# Patient Record
Sex: Male | Born: 1945 | Race: White | Hispanic: No | Marital: Married | Smoking: Former smoker
Health system: Southern US, Community
[De-identification: ages and names within clinical notes are randomized; demographics above are authoritative.]

## PROBLEM LIST (undated history)

## (undated) DIAGNOSIS — I4891 Unspecified atrial fibrillation: Principal | ICD-10-CM

## (undated) DIAGNOSIS — I2510 Atherosclerotic heart disease of native coronary artery without angina pectoris: Secondary | ICD-10-CM

## (undated) DIAGNOSIS — E785 Hyperlipidemia, unspecified: Secondary | ICD-10-CM

## (undated) DIAGNOSIS — I480 Paroxysmal atrial fibrillation: Secondary | ICD-10-CM

## (undated) DIAGNOSIS — I10 Essential (primary) hypertension: Secondary | ICD-10-CM

## (undated) DIAGNOSIS — I495 Sick sinus syndrome: Secondary | ICD-10-CM

## (undated) HISTORY — DX: Paroxysmal atrial fibrillation: I48.0

## (undated) HISTORY — DX: Sick sinus syndrome: I49.5

## (undated) HISTORY — PX: CARDIAC ELECTROPHYSIOLOGY STUDY AND ABLATION: SHX1294

---

## 1997-10-17 ENCOUNTER — Inpatient Hospital Stay (HOSPITAL_COMMUNITY): Admission: EM | Admit: 1997-10-17 | Discharge: 1997-10-19 | Payer: Self-pay | Admitting: Emergency Medicine

## 1997-10-26 ENCOUNTER — Inpatient Hospital Stay (HOSPITAL_COMMUNITY): Admission: EM | Admit: 1997-10-26 | Discharge: 1997-11-01 | Payer: Self-pay | Admitting: Emergency Medicine

## 1997-10-29 HISTORY — PX: CORONARY ANGIOPLASTY WITH STENT PLACEMENT: SHX49

## 1997-12-03 ENCOUNTER — Ambulatory Visit (HOSPITAL_COMMUNITY): Admission: RE | Admit: 1997-12-03 | Discharge: 1997-12-03 | Payer: Self-pay | Admitting: Endocrinology

## 2003-06-25 ENCOUNTER — Emergency Department (HOSPITAL_COMMUNITY): Admission: AD | Admit: 2003-06-25 | Discharge: 2003-06-25 | Payer: Self-pay | Admitting: Family Medicine

## 2003-06-26 ENCOUNTER — Emergency Department (HOSPITAL_COMMUNITY): Admission: EM | Admit: 2003-06-26 | Discharge: 2003-06-26 | Payer: Self-pay | Admitting: Family Medicine

## 2003-07-06 ENCOUNTER — Emergency Department (HOSPITAL_COMMUNITY): Admission: EM | Admit: 2003-07-06 | Discharge: 2003-07-06 | Payer: Self-pay | Admitting: Family Medicine

## 2005-07-15 ENCOUNTER — Encounter: Payer: Self-pay | Admitting: Endocrinology

## 2006-09-08 ENCOUNTER — Ambulatory Visit: Payer: Self-pay | Admitting: Internal Medicine

## 2010-07-29 NOTE — Letter (Signed)
September 08, 2006    Richard A. Alanda Amass, M.D.  [id number]  [place], [place] [address]   RE:  Timothy [name], Timothy [name]  MRN:  [id number]  /  DOB:  Mar 23, 1945   Dear Luan Pulling:   I hope this letter finds you well.  I hope your family is well also.   Dayton Bailiff, as you know, works for [employer] and we recently  had a project done with them.  At that time, Beacher May told me about  Timothy [name], that he was having problems with tachypalpitations, and I  said that if there was ever a question I would be glad to see him.  About 3 or 4 weeks ago Timothy [name] called again and asked if I would  see him, so I saw Timothy [name] today at his request.   As you know, he has had atrial fibrillation for 10-15 years and has been  on amiodarone for about 10 years.  This has been moderately effective  over the years but over the last couple of years he has had increasing  frequency of episodes, increasing symptoms associated with the frequency  and the duration of these episodes.  Over the last 3-4 weeks his  symptoms have been increasingly problematic.  He becomes unable to work  with these, he gets fatigued and has significant dyspnea on exertion.   Further issues related to his amiodarone include hyperthyroidism for  which he is treated with potassium perchlorate.   He has a history of coronary artery disease.  He is status post stenting  about 7 years ago.  He had an echocardiogram done 4 or 5 years ago which  we were able to obtain from your office that demonstrated normal left  ventricular function.   His thromboembolic risk factors are notable for hypertension for which  he takes amlodipine (see below).  They are otherwise negative for  diabetes, prior stroke, or congestive heart failure.   His past medical history, apart from the above, is largely negative.  His review of systems is extensively negative across multiple organ  systems.   His past surgical history is negative.   Social history, he is married.  He has three children, nine  grandchildren.  He does not use cigarettes or recreational drugs.  He  does drink occasional red wine.   His medications include amiodarone 200 a day, amlodipine 5, potassium  perchlorate, warfarin 5, Lipitor 20, and aspirin 81.  He has no known  drug allergies.   On examination, he is a middle-aged Caucasian male appearing his stated  age of 84.  His blood pressure is 132/64 and his pulse is 50.  His HEENT  exam demonstrated no icterus or xanthomata.  The neck veins were flat.  The carotids were brisk and full bilaterally without bruits.  The back  was without kyphosis or scoliosis.  Lungs were clear, heart sounds were  regular without murmurs or gallops.  The abdomen was soft with active  bowel sounds without midline pulsation or hepatomegaly.  Femoral pulses  were 2+, distal pulses were intact.  There was no clubbing, cyanosis, or  edema.  The neurological exam was grossly normal.  His skin was warm and  dry.   Electrocardiogram dated today demonstrated sinus rhythm at 57 with  intervals of 0.16/0.11/0.46.  The axis was rightward at 108.  The  electrocardiogram was otherwise normal.   IMPRESSION:  1. Paroxysmal atrial fibrillation.  2. Amiodarone therapy for #1.  3. Hyperthyroidism complicating #2.  4. Bradycardia complicating #2.  5. Hypertension, on amlodipine.   Rich, I think that Timothy [name] would be a good candidate for  consideration of pulmonary vein isolation.  Apparently you had mentioned  this to him as well and had specifically mentioned that somebody at  [facility] would be a good resource and I concur and gave him Colin Broach name.   We went over some of the data.  I think notwithstanding the moderate  success associated with the procedure, I think given the longterm  complications of his amiodarone and the Coumadin therapy that I would  pursue it.   The only other question I had is whether he  might be better served with  an ARB for his hypertension as opposed to amlodipine.  As you know,  there are data that suggests that frequency of atrial fibrillation may  be reduced by use of an ARB potentially related to more beneficial  remodeling of the atrium.   If I can be of any further assistance in his care please do not hesitate  to contact me.    Sincerely,      Duke Salvia, MD, [facility]  Electronically Signed    SCK/MedQ  DD: 09/08/2006  DT: 09/08/2006  Job #: [id number]   CC:   Lucila Maine, M.D.  Clydie Braun, M.D.

## 2011-06-13 ENCOUNTER — Other Ambulatory Visit: Payer: Self-pay

## 2011-06-13 ENCOUNTER — Emergency Department (HOSPITAL_COMMUNITY): Payer: Commercial Managed Care - PPO

## 2011-06-13 ENCOUNTER — Inpatient Hospital Stay (HOSPITAL_COMMUNITY)
Admission: EM | Admit: 2011-06-13 | Discharge: 2011-06-15 | DRG: 310 | Disposition: A | Discharge status: Home / Self Care | Payer: Commercial Managed Care - PPO | Attending: Internal Medicine | Admitting: Internal Medicine

## 2011-06-13 ENCOUNTER — Encounter (HOSPITAL_COMMUNITY): Payer: Self-pay | Admitting: *Deleted

## 2011-06-13 DIAGNOSIS — I480 Paroxysmal atrial fibrillation: Secondary | ICD-10-CM | POA: Diagnosis present

## 2011-06-13 DIAGNOSIS — I4891 Unspecified atrial fibrillation: Principal | ICD-10-CM | POA: Diagnosis present

## 2011-06-13 DIAGNOSIS — E876 Hypokalemia: Secondary | ICD-10-CM | POA: Diagnosis present

## 2011-06-13 DIAGNOSIS — Z7982 Long term (current) use of aspirin: Secondary | ICD-10-CM

## 2011-06-13 DIAGNOSIS — Z9861 Coronary angioplasty status: Secondary | ICD-10-CM

## 2011-06-13 DIAGNOSIS — Z79899 Other long term (current) drug therapy: Secondary | ICD-10-CM

## 2011-06-13 DIAGNOSIS — I10 Essential (primary) hypertension: Secondary | ICD-10-CM | POA: Diagnosis present

## 2011-06-13 DIAGNOSIS — R079 Chest pain, unspecified: Secondary | ICD-10-CM | POA: Diagnosis present

## 2011-06-13 DIAGNOSIS — E785 Hyperlipidemia, unspecified: Secondary | ICD-10-CM | POA: Diagnosis present

## 2011-06-13 DIAGNOSIS — I2510 Atherosclerotic heart disease of native coronary artery without angina pectoris: Secondary | ICD-10-CM | POA: Diagnosis present

## 2011-06-13 DIAGNOSIS — R002 Palpitations: Secondary | ICD-10-CM | POA: Diagnosis present

## 2011-06-13 HISTORY — DX: Unspecified atrial fibrillation: I48.91

## 2011-06-13 HISTORY — PX: TRANSTHORACIC ECHOCARDIOGRAM: SHX275

## 2011-06-13 HISTORY — DX: Hyperlipidemia, unspecified: E78.5

## 2011-06-13 HISTORY — DX: Atherosclerotic heart disease of native coronary artery without angina pectoris: I25.10

## 2011-06-13 HISTORY — DX: Essential (primary) hypertension: I10

## 2011-06-13 LAB — BASIC METABOLIC PANEL
CO2: 20 mEq/L (ref 19–32)
CO2: 23 mEq/L (ref 19–32)
Calcium: 9.4 mg/dL (ref 8.4–10.5)
Calcium: 8.5 mg/dL (ref 8.4–10.5)
GFR calc Af Amer: >90 mL/min (ref >90)
GFR calc non Af Amer: 90 mL/min — ABNORMAL LOW (ref >90)
Glucose, Bld: 117 mg/dL — ABNORMAL HIGH (ref 70–99)
Potassium: 3.2 mEq/L — ABNORMAL LOW (ref 3.5–5.1)
Sodium: 140 mEq/L (ref 135–145)
Sodium: 142 mEq/L (ref 135–145)

## 2011-06-13 LAB — CBC
Hemoglobin: 15.7 g/dL (ref 13.0–17.0)
Platelets: 237 10*3/uL (ref 150–400)
Platelets: 243 10*3/uL (ref 150–400)
RBC: 5 MIL/uL (ref 4.22–5.81)
RBC: 4.52 MIL/uL (ref 4.22–5.81)
RDW: 13.4 % (ref 11.5–15.5)
WBC: 7.7 10*3/uL (ref 4.0–10.5)
WBC: 8 10*3/uL (ref 4.0–10.5)

## 2011-06-13 LAB — LIPID PANEL
LDL Cholesterol: 67 mg/dL (ref 0–99)
Total CHOL/HDL Ratio: 3.2 RATIO
VLDL: 30 mg/dL (ref 0–40)

## 2011-06-13 LAB — APTT: aPTT: 27 seconds (ref 24–37)

## 2011-06-13 LAB — PROTIME-INR
INR: 0.92 (ref 0.00–1.49)
Prothrombin Time: 12.6 seconds (ref 11.6–15.2)

## 2011-06-13 LAB — TROPONIN I: Troponin I: <0.3 ng/mL (ref <0.30)

## 2011-06-13 LAB — CARDIAC PANEL(CRET KIN+CKTOT+MB+TROPI)
CK, MB: 2.9 ng/mL (ref 0.3–4.0)
CK, MB: 2.2 ng/mL (ref 0.3–4.0)
Relative Index: 2.1 (ref 0.0–2.5)
Troponin I: <0.3 ng/mL (ref <0.30)
Troponin I: <0.3 ng/mL (ref <0.30)

## 2011-06-13 LAB — MRSA PCR SCREENING: MRSA by PCR: NEGATIVE

## 2011-06-13 MED ORDER — DILTIAZEM HCL 25 MG/5ML IV SOLN
INTRAVENOUS | Status: AC
  Administered 2011-06-13: 10 mg
  Filled 2011-06-13: qty 5

## 2011-06-13 MED ORDER — SODIUM CHLORIDE 0.9 % IV BOLUS (SEPSIS)
500.0000 mL | Freq: Once | INTRAVENOUS | Status: AC
  Administered 2011-06-13: 500 mL via INTRAVENOUS

## 2011-06-13 MED ORDER — ASPIRIN 81 MG PO CHEW
CHEWABLE_TABLET | ORAL | Status: AC
  Filled 2011-06-13: qty 4

## 2011-06-13 MED ORDER — DEXTROSE 5 % IV SOLN
5.0000 mg/h | INTRAVENOUS | Status: DC
  Administered 2011-06-13: 20 mg/h via INTRAVENOUS

## 2011-06-13 MED ORDER — ONDANSETRON HCL 4 MG PO TABS
4.0000 mg | ORAL_TABLET | Freq: Four times a day (QID) | ORAL | Status: DC | PRN
  Filled 2011-06-13: qty 0.5

## 2011-06-13 MED ORDER — POTASSIUM CHLORIDE 10 MEQ/100ML IV SOLN
10.0000 meq | INTRAVENOUS | Status: AC
  Administered 2011-06-13 (×2): 10 meq via INTRAVENOUS
  Filled 2011-06-13: qty 100

## 2011-06-13 MED ORDER — ACETAMINOPHEN 650 MG RE SUPP: 650.0000 mg | Freq: Four times a day (QID) | RECTAL | Status: DC | PRN

## 2011-06-13 MED ORDER — ZOLPIDEM TARTRATE 5 MG PO TABS: 5.0000 mg | ORAL_TABLET | Freq: Every evening | ORAL | Status: DC | PRN

## 2011-06-13 MED ORDER — DRONEDARONE HCL 400 MG PO TABS
400.0000 mg | ORAL_TABLET | Freq: Two times a day (BID) | ORAL | Status: DC
  Administered 2011-06-13 – 2011-06-15 (×5): 400 mg via ORAL
  Filled 2011-06-13 (×7): qty 1

## 2011-06-13 MED ORDER — HEPARIN BOLUS VIA INFUSION
4000.0000 [IU] | Freq: Once | INTRAVENOUS | Status: AC
  Administered 2011-06-13: 4000 [IU] via INTRAVENOUS
  Filled 2011-06-13: qty 4000

## 2011-06-13 MED ORDER — ACETAMINOPHEN 325 MG PO TABS: 650.0000 mg | ORAL_TABLET | Freq: Four times a day (QID) | ORAL | Status: DC | PRN

## 2011-06-13 MED ORDER — HEPARIN (PORCINE) IN NACL 100-0.45 UNIT/ML-% IJ SOLN
1150.0000 [IU]/h | INTRAMUSCULAR | Status: DC
  Administered 2011-06-13: 1300 [IU]/h via INTRAVENOUS
  Administered 2011-06-13 – 2011-06-15 (×2): 1150 [IU]/h via INTRAVENOUS
  Filled 2011-06-13 (×5): qty 250

## 2011-06-13 MED ORDER — SODIUM CHLORIDE 0.9 % IV SOLN
INTRAVENOUS | Status: DC
  Administered 2011-06-13 (×2): via INTRAVENOUS
  Administered 2011-06-14: 75 mL via INTRAVENOUS
  Administered 2011-06-15: 08:00:00 via INTRAVENOUS

## 2011-06-13 MED ORDER — SODIUM CHLORIDE 0.9 % IJ SOLN
3.0000 mL | Freq: Two times a day (BID) | INTRAMUSCULAR | Status: DC
  Administered 2011-06-14 – 2011-06-15 (×3): 3 mL via INTRAVENOUS

## 2011-06-13 MED ORDER — OXYCODONE HCL 5 MG PO TABS: 5.0000 mg | ORAL_TABLET | ORAL | Status: DC | PRN

## 2011-06-13 MED ORDER — DILTIAZEM HCL 100 MG IV SOLR
20.0000 mg/h | INTRAVENOUS | Status: DC
  Administered 2011-06-13 (×2): 20 mg/h via INTRAVENOUS
  Administered 2011-06-14: 10 mg/h via INTRAVENOUS
  Filled 2011-06-13 (×3): qty 100

## 2011-06-13 MED ORDER — DILTIAZEM HCL 100 MG IV SOLR
5.0000 mg/h | Freq: Once | INTRAVENOUS | Status: AC
  Administered 2011-06-13: 5 mg/h via INTRAVENOUS

## 2011-06-13 MED ORDER — METOPROLOL TARTRATE 25 MG PO TABS
25.0000 mg | ORAL_TABLET | Freq: Two times a day (BID) | ORAL | Status: DC
  Administered 2011-06-13 – 2011-06-15 (×5): 25 mg via ORAL
  Filled 2011-06-13 (×6): qty 1

## 2011-06-13 MED ORDER — ASPIRIN 325 MG PO TABS: 325.0000 mg | ORAL_TABLET | ORAL | Status: DC

## 2011-06-13 MED ORDER — ONDANSETRON HCL 4 MG/2ML IJ SOLN: 4.0000 mg | Freq: Four times a day (QID) | INTRAMUSCULAR | Status: DC | PRN

## 2011-06-13 MED ORDER — ASPIRIN EC 325 MG PO TBEC
325.0000 mg | DELAYED_RELEASE_TABLET | Freq: Every day | ORAL | Status: DC
  Administered 2011-06-13 – 2011-06-15 (×3): 325 mg via ORAL
  Filled 2011-06-13 (×3): qty 1

## 2011-06-13 MED ORDER — POTASSIUM CHLORIDE 10 MEQ/100ML IV SOLN
INTRAVENOUS | Status: AC
  Filled 2011-06-13: qty 100

## 2011-06-13 MED ORDER — SODIUM CHLORIDE 0.9 % IV SOLN: INTRAVENOUS | Status: AC

## 2011-06-13 MED ORDER — ALUM & MAG HYDROXIDE-SIMETH 200-200-20 MG/5ML PO SUSP: 30.0000 mL | Freq: Four times a day (QID) | ORAL | Status: DC | PRN

## 2011-06-13 MED ORDER — HYDROMORPHONE HCL PF 1 MG/ML IJ SOLN: 0.5000 mg | INTRAMUSCULAR | Status: DC | PRN

## 2011-06-13 NOTE — Plan of Care (Signed)
Discussed with Dr. Rennis Golden with [facility] cardiology regarding the patient, Mr Mabile admitted this morning with Afib with RVR. Dr. Rennis Golden will be assuming care as primary attending, [facility] will sign off.    Ivannah Zody M.D. [facility] 06/13/2011, 8:57 AM  Pager: [phone]

## 2011-06-13 NOTE — Progress Notes (Signed)
ANTICOAGULATION CONSULT NOTE - Follow Up Consult  Pharmacy Consult for Heparin Indication: atrial fibrillation  Allergies  Allergen Reactions  . Nitroglycerin Other (See Comments)    "Blood Pressure goes to zero"    Patient Measurements: Height: 5\' 6"  (167.6 cm) Weight: 188 lb 0.8 oz (85.3 kg) IBW/kg (Calculated) : 63.8  Heparin Dosing Weight: 85.3 kg  Vital Signs: Temp: 98.1 F (36.7 C) (03/30 2000) Temp src: Oral (03/30 2000) BP: 122/86 mmHg (03/30 2135) Pulse Rate: 99  (03/30 2135)  Labs:  Alvira Philips 06/13/11 2114 06/13/11 2113 06/13/11 1232 06/13/11 0530 06/13/11 0500 06/13/11 0459 06/13/11 0103  HGB -- -- -- -- 14.1 -- 15.7  HCT -- -- -- -- 41.4 -- 45.1  PLT -- -- -- -- 243 -- 237  APTT -- -- -- 27 -- -- --  LABPROT -- -- -- 12.6 -- -- --  INR -- -- -- 0.92 -- -- --  HEPARINUNFRC 0.46 -- 0.81* -- -- -- --  CREATININE -- -- -- -- 0.87 -- 0.79  CKTOTAL -- 108 135 -- -- 163 --  CKMB -- 2.2 2.9 -- -- 3.1 --  TROPONINI -- <0.30 <0.30 -- -- <0.30 --   Estimated Creatinine Clearance: 89 ml/min (by C-G formula based on Cr of 0.87).   Medications:  Scheduled:     . sodium chloride   Intravenous STAT  . aspirin EC  325 mg Oral Daily  . diltiazem (CARDIZEM) infusion  5-15 mg/hr Intravenous Once  . diltiazem      . dronedarone  400 mg Oral BID WC  . heparin  4,000 Units Intravenous Once  . metoprolol tartrate  25 mg Oral BID  . potassium chloride  10 mEq Intravenous Q1 Hr x 2  . potassium chloride      . sodium chloride  500 mL Intravenous Once  . sodium chloride  500 mL Intravenous Once  . sodium chloride  3 mL Intravenous Q12H  . DISCONTD: aspirin  325 mg Oral STAT   Infusions:     . sodium chloride 75 mL/hr at 06/13/11 1728  . diltiazem (CARDIZEM) infusion 20 mg/hr (06/13/11 1045)  . heparin 1,150 Units/hr (06/13/11 1445)  . DISCONTD: diltiazem (CARDIZEM) infusion 20 mg/hr (06/13/11 0407)    Assessment: 66 yo M admitted w/ afib. S/p 4000 unit bolus and  rate at 1300 units/hr. 6 hr heparin level above goal at 0.81.  Heparin rate was decreased to 1150 uts/hr.  HL recheck 0.44 at goal 0.3-0.7. No bleeding issues noted  Goal of Therapy:  Heparin level 0.3-0.7 units/ml   Plan:  Continue Heparin drip 1150 uts/hr Daily HL and CBC  Marcelino Scot, PharmD  [phone] 06/13/2011,10:13 PM

## 2011-06-13 NOTE — ED Notes (Signed)
Pt woke up at 2330 with palpitations.  This is associated with sob and CP and weakness.  Pt with hx afib.

## 2011-06-13 NOTE — Progress Notes (Signed)
ANTICOAGULATION CONSULT NOTE - Initial Consult  Pharmacy Consult for Heparin Indication: atrial fibrillation  Allergies  Allergen Reactions  . Nitroglycerin Other (See Comments)    "Blood Pressure goes to zero"    Patient Measurements: Height: 5\' 6"  (167.6 cm) Weight: 188 lb 0.8 oz (85.3 kg) IBW/kg (Calculated) : 63.8  Heparin Dosing Weight: 85.3 kg  Vital Signs: Temp: 97.7 F (36.5 C) (03/30 0300) Temp src: Oral (03/30 0300) BP: 121/81 mmHg (03/30 0430) Pulse Rate: 102  (03/30 0430)  Labs:  Basename 06/13/11 0103  HGB 15.7  HCT 45.1  PLT 237  APTT --  LABPROT --  INR --  HEPARINUNFRC --  CREATININE 0.79  CKTOTAL --  CKMB --  TROPONINI <0.30   Estimated Creatinine Clearance: 96.8 ml/min (by C-G formula based on Cr of 0.79).  Medical History: Past Medical History  Diagnosis Date  . Atrial fibrillation   . Hypertension   . CAD (coronary artery disease)   . Hyperlipidemia    Assessment:  Being admitted with atrial fibrillation.  No baseline coags, will add.  Cardiology to see.  Goal of Therapy:  Heparin level 0.3-0.7 units/ml   Plan:    Will begin heparin with 4000 unit IV bolus, then infusion at 1300 units/hour.  First heparin level ~ 6 hrs after infusion begins.  Daily heparin level and CBC while on heparin.  Dennie Fetters, RPh  Pager: [phone] 06/13/2011,4:48 AM

## 2011-06-13 NOTE — ED Notes (Signed)
Pt states he was woke from sleep at 11:30 tonight c/o CP and palpitations.  Pt with hx of Afib, had ablation in 2008 and 2009.  States he has not had an episode since 2009.  States he always has pain when in Afib.  Also c/o SOB.  No n/v, no diaphoresis, denies abdominal pain.

## 2011-06-13 NOTE — H&P (Signed)
DATE OF ADMISSION:  06/13/2011  PCP:  Timothy [name] in [place] CARDS:  [facility] ( Dr. Alanda [name])  Chief Complaint: Palpitations and Chest Pain   HPI: Timothy [name] is an 66 y.o. male with a history of Paroxysmal Atrial fibrillation S/P cardioversion and ablations who presents with complaints of palpitations, chest pain and SOB since 11:30pm tonight.  He describes having chest discomfort in his mid chest without radiation.  In the ED his heart rate was 120-130s, and a found to be in Atrial fibrillation with RVR.  A cardiazem drip was started, and his rate improved.   He was referred for admission.  He reports that he has not had problems since his ablation in 2006.    Past Medical History  Diagnosis Date  . Atrial fibrillation   . Hypertension   . CAD (coronary artery disease)   . Hyperlipidemia     Past Surgical History  Procedure Date  . Cardiac electrophysiology study and ablation     Ablation  X  4  . Coronary angioplasty with stent placement     Stent  X  1    Medications:  HOME MEDS: Prior to Admission medications   Medication Sig Start Date End Date Taking? Authorizing Provider  aspirin 81 MG chewable tablet Chew 81 mg by mouth daily.   Yes Historical Provider, MD  metoprolol tartrate (LOPRESSOR) 25 MG tablet Take 25 mg by mouth daily.   Yes Historical Provider, MD  simvastatin (ZOCOR) 40 MG tablet Take 40 mg by mouth at bedtime.   Yes Historical Provider, MD    Allergies:  Allergies  Allergen Reactions  . Nitroglycerin Other (See Comments)    "Blood Pressure goes to zero"    Social History:   does not have a smoking history on file. He does not have any smokeless tobacco history on file. He reports that he drinks alcohol. His drug history not on file.  Family History: Family History  Problem Relation Age of Onset  . Coronary artery disease Timothy [name]   . Hypertension Timothy [name]   . Diabetes Timothy [name]     Review of Systems:  The patient denies anorexia, fever, weight  loss,, vision loss, decreased hearing, hoarseness, chest pain, syncope, dyspnea on exertion, peripheral edema, balance deficits, hemoptysis, abdominal pain, melena, hematochezia, severe indigestion/heartburn, hematuria, incontinence, genital sores, muscle weakness, suspicious skin lesions, transient blindness, difficulty walking, depression, unusual weight change, abnormal bleeding, enlarged lymph nodes, angioedema, and breast masses.   Physical Exam:  GEN:  Pleasant Obese 66 year old Caucasian male examined  and in no acute distress; cooperative with exam Filed Vitals:   06/13/11 0300 06/13/11 0349 06/13/11 0400 06/13/11 0430  BP: 133/88 118/85  121/81  Pulse: 118   102  Temp: 97.7 F (36.5 [name])     TempSrc: Oral     Resp: 20 14  12   Height:   5\' 6"  (1.676 m)   Weight:   85.3 kg (188 lb 0.8 oz)   SpO2: 99% 98%  98%   Blood pressure 121/81, pulse 102, temperature 97.7 F (36.5 [name]), temperature source Oral, resp. rate 12, height 5\' 6"  (1.676 m), weight 85.3 kg (188 lb 0.8 oz), SpO2 98.00%. PSYCH: He is alert and oriented x4; does not appear anxious does not appear depressed; affect is normal HEENT: Normocephalic and Atraumatic, Mucous membranes pink; PERRLA; EOM intact; Fundi:  Benign;  No scleral icterus, Nares: Patent, Oropharynx: Clear, Fair Dentition, Neck:  FROM, no cervical lymphadenopathy nor thyromegaly or carotid bruit;  no JVD; Breasts:: Not examined CHEST WALL: No tenderness CHEST: Normal respiration, clear to auscultation bilaterally HEART: Regular rate and rhythm; no murmurs rubs or gallops BACK: No kyphosis or scoliosis; no CVA tenderness ABDOMEN: Positive Bowel Sounds, Obese, soft non-tender; no masses, no organomegaly.   Rectal Exam: Not done EXTREMITIES: No cyanosis, clubbing or edema; no ulcerations. Genitalia: not examined PULSES: 2+ and symmetric SKIN: Normal hydration no rash or ulceration CNS: Cranial nerves 2-12 grossly intact no focal neurologic deficit   Labs &  Imaging Results for orders placed during the hospital encounter of 06/13/11 (from the past 48 hour(s))  CBC     Status: Normal   Collection Time   06/13/11  1:03 AM      Component Value Range Comment   WBC 7.7  4.0 - 10.5 (K/uL)    RBC 5.00  4.22 - 5.81 (MIL/uL)    Hemoglobin 15.7  13.0 - 17.0 (g/dL)    HCT 16.1  09.6 - 04.5 (%)    MCV 90.2  78.0 - 100.0 (fL)    MCH 31.4  26.0 - 34.0 (pg)    MCHC 34.8  30.0 - 36.0 (g/dL)    RDW 40.9  81.1 - 91.4 (%)    Platelets 237  150 - 400 (K/uL)   BASIC METABOLIC PANEL     Status: Abnormal   Collection Time   06/13/11  1:03 AM      Component Value Range Comment   Sodium 140  135 - 145 (mEq/L)    Potassium 3.2 (*) 3.5 - 5.1 (mEq/L)    Chloride 107  96 - 112 (mEq/L)    CO2 20  19 - 32 (mEq/L)    Glucose, Bld 117 (*) 70 - 99 (mg/dL)    BUN 17  6 - 23 (mg/dL)    Creatinine, Ser 7.82  0.50 - 1.35 (mg/dL)    Calcium 9.4  8.4 - 10.5 (mg/dL)    GFR calc non Af Amer >90  >90 (mL/min)    GFR calc Af Amer >90  >90 (mL/min)   TROPONIN I     Status: Normal   Collection Time   06/13/11  1:03 AM      Component Value Range Comment   Troponin I <0.30  <0.30 (ng/mL)    Dg Chest Portable 1 View  06/13/2011  *RADIOLOGY REPORT*  Clinical Data: Palpitations.  Shortness of breath.  History of atrial fibrillation.  PORTABLE CHEST - 1 VIEW  Comparison: None.  Findings: Slightly shallow inspiration.  Normal heart size and pulmonary vascularity for technique.  No focal airspace consolidation in the lungs.  No blunting of costophrenic angles. No pneumothorax.  Visualized bones appear grossly intact with degenerative changes in the thoracic spine.  IMPRESSION: No evidence of active pulmonary disease.  Original Report Authenticated By: Timothy [name], M.D.    EKG:  Atrial fibrillation with RVR rate 120.     Assessment: Present on Admission:  .Atrial fibrillation with RVR .Palpitations .Chest pain .Hypokalemia .Hypertension .CAD (coronary artery  disease) .Hyperlipidemia    Plan:    Admitted to Stepdown Bed IV Cardiazem Drip IV Heparin drip Cardiac Enzymes Replete K+ Reconcile Home Medications.  Notify [facility] in AM Other plans as per orders.    CODE STATUS:      FULL CODE         Timothy [name] 06/13/2011, 4:47 AM

## 2011-06-13 NOTE — Progress Notes (Signed)
ANTICOAGULATION CONSULT NOTE - Follow Up Consult  Pharmacy Consult for Heparin Indication: atrial fibrillation  Allergies  Allergen Reactions  . Nitroglycerin Other (See Comments)    "Blood Pressure goes to zero"    Patient Measurements: Height: 5\' 6"  (167.6 cm) Weight: 188 lb 0.8 oz (85.3 kg) IBW/kg (Calculated) : 63.8  Heparin Dosing Weight: 85.3 kg  Vital Signs: Temp: 98.4 F (36.9 C) (03/30 1200) Temp src: Oral (03/30 1200) BP: 111/77 mmHg (03/30 1400) Pulse Rate: 59  (03/30 1400)  Labs:  Basename 06/13/11 1232 06/13/11 0530 06/13/11 0500 06/13/11 0459 06/13/11 0103  HGB -- -- 14.1 -- 15.7  HCT -- -- 41.4 -- 45.1  PLT -- -- 243 -- 237  APTT -- 27 -- -- --  LABPROT -- 12.6 -- -- --  INR -- 0.92 -- -- --  HEPARINUNFRC 0.81* -- -- -- --  CREATININE -- -- 0.87 -- 0.79  CKTOTAL 135 -- -- 163 --  CKMB 2.9 -- -- 3.1 --  TROPONINI <0.30 -- -- <0.30 <0.30   Estimated Creatinine Clearance: 89 ml/min (by C-G formula based on Cr of 0.87).   Medications:  Scheduled:    . sodium chloride   Intravenous STAT  . aspirin EC  325 mg Oral Daily  . diltiazem (CARDIZEM) infusion  5-15 mg/hr Intravenous Once  . diltiazem      . dronedarone  400 mg Oral BID WC  . heparin  4,000 Units Intravenous Once  . metoprolol tartrate  25 mg Oral BID  . potassium chloride  10 mEq Intravenous Q1 Hr x 2  . potassium chloride      . sodium chloride  500 mL Intravenous Once  . sodium chloride  500 mL Intravenous Once  . sodium chloride  3 mL Intravenous Q12H  . DISCONTD: aspirin  325 mg Oral STAT   Infusions:    . sodium chloride 75 mL/hr at 06/13/11 0415  . diltiazem (CARDIZEM) infusion 20 mg/hr (06/13/11 1045)  . heparin 1,300 Units/hr (06/13/11 1610)  . DISCONTD: diltiazem (CARDIZEM) infusion 20 mg/hr (06/13/11 0407)    Assessment: 66 yo M admitted w/ afib. S/p 4000 unit bolus and rate at 1300 units/hr. 6 hr heparin level above goal at 0.81. Will decrease rate slightly. No bleeding  issues noted  Goal of Therapy:  Heparin level 0.3-0.7 units/ml   Plan:  1) Decrease Heparin rate to 1150 units/hr (11.61ml/hr) 2) Check 6 -hr heparin level after rate decrease 3) continue to monitor daily CBC and heparin levels  Janace Litten, PharmD  [phone] 06/13/2011,2:45 PM

## 2011-06-13 NOTE — Consult Note (Signed)
Pt. Seen and examined. Agree with the NP/PA-C note as written. Complex atrial fibrillation history. 2 prior ablations by Dr. Sampson Goon at [facility], last in 2009.  Was maintained on amiodarone for >10 years and did have some skin changes but no evidence for pulmonary or thyroid toxicity. Has previously been on Tikosyn as well, but reported that was "not effective".  He now presents with a-fib and RVR last pm. He is heparinized. At this point, he is unlikely to maintain sinus without an anti-arrythmic. My preference would be for Tikosyn, as it is the most effective, but he is concerned about risk of the medication and his "failure" in the past. The other options include amiodarone and dronedarone. Given his likley long-term anti-arrythmic requirement, I would recommend dronedarone at this point. There is no history of heart failure, but we'll re-check a 2D echocardiogram. I don't feel he is describing any ischemic symptoms.  Agree with cardizem for rate control. Will likely require cardioversion on Monday (No TEE) as dronedarone has a low-rate of conversion.  He may need referral to [facility] or [facility] for complex 3rd ablation.  Chrystie Nose, MD, [facility] Attending Cardiologist [facility]

## 2011-06-13 NOTE — ED Provider Notes (Signed)
History     CSN: [id number]  Arrival date & time 06/13/11  0058   First MD Initiated Contact with Patient 06/13/11 0115      Chief Complaint  Patient presents with  . Palpitations    (Consider location/radiation/quality/duration/timing/severity/associated sxs/prior treatment) HPI Pt is normal states of health with prev hx of prox Afib and 2 prev cardioversions p/w acute onset palpitations waking him form his sleep at 2330. C/o mild chest pressure and SOB. No fever, chills, cough, LE swelling or pain Past Medical History  Diagnosis Date  . Atrial fibrillation   . Hypertension     Past Surgical History  Procedure Date  . Cardiac electrophysiology study and ablation     No family history on file.  History  Substance Use Topics  . Smoking status: Not on file  . Smokeless tobacco: Not on file  . Alcohol Use: Yes      Review of Systems  Constitutional: Negative for fever and chills.  HENT: Negative for neck pain.   Respiratory: Positive for shortness of breath. Negative for cough, chest tightness, wheezing and stridor.   Cardiovascular: Positive for chest pain and palpitations. Negative for leg swelling.  Gastrointestinal: Negative for nausea, vomiting and abdominal pain.  Skin: Negative for color change, pallor and rash.  Neurological: Negative for dizziness, weakness, numbness and headaches.    Allergies  Nitroglycerin  Home Medications   Current Outpatient Rx  Name Route Sig Dispense Refill  . ASPIRIN 81 MG PO CHEW Oral Chew 81 mg by mouth daily.    Marland Kitchen METOPROLOL TARTRATE 25 MG PO TABS Oral Take 25 mg by mouth daily.    Marland Kitchen SIMVASTATIN 40 MG PO TABS Oral Take 40 mg by mouth at bedtime.      BP 133/88  Pulse 118  Temp(Src) 98.6 F (37 C) (Oral)  Resp 20  SpO2 99%  Physical Exam  Nursing note and vitals reviewed. Constitutional: He is oriented to person, place, and time. He appears well-developed and well-nourished. No distress.  HENT:  Head:  Normocephalic and atraumatic.  Mouth/Throat: Oropharynx is clear and moist.  Eyes: EOM are normal. Pupils are equal, round, and reactive to light.  Neck: Normal range of motion. Neck supple.  Cardiovascular:       Tachy irreg irreg  Pulmonary/Chest: Effort normal and breath sounds normal. No respiratory distress. He has no wheezes. He has no rales.  Abdominal: Soft. Bowel sounds are normal. He exhibits no distension. There is no tenderness. There is no rebound and no guarding.  Musculoskeletal: Normal range of motion. He exhibits no edema and no tenderness.  Neurological: He is alert and oriented to person, place, and time.       5/5 motor, sensation intact  Skin: Skin is warm and dry. No rash noted. No erythema.  Psychiatric: He has a normal mood and affect. His behavior is normal.    ED Course  Procedures (including critical care time)  Labs Reviewed  BASIC METABOLIC PANEL - Abnormal; Notable for the following:    Potassium 3.2 (*)    Glucose, Bld 117 (*)    All other components within normal limits  CBC  TROPONIN I   Dg Chest Portable 1 View  06/13/2011  *RADIOLOGY REPORT*  Clinical Data: Palpitations.  Shortness of breath.  History of atrial fibrillation.  PORTABLE CHEST - 1 VIEW  Comparison: None.  Findings: Slightly shallow inspiration.  Normal heart size and pulmonary vascularity for technique.  No focal airspace consolidation in the  lungs.  No blunting of costophrenic angles. No pneumothorax.  Visualized bones appear grossly intact with degenerative changes in the thoracic spine.  IMPRESSION: No evidence of active pulmonary disease.  Original Report Authenticated By: Marlon Pel, M.D.     1. Atrial fibrillation with RVR      Date: 06/13/2011  Rate:120  Rhythm: atrial fibrillation  QRS Axis: normal  Intervals: normal  ST/T Wave abnormalities: nonspecific T wave changes  Conduction Disutrbances:none  Narrative Interpretation:   Old EKG Reviewed: none  available    MDM  Discussed with triad who will admit for [facility].         Loren Racer, MD 06/13/11 [phone]

## 2011-06-13 NOTE — Consult Note (Signed)
Reason for Consult: atrial fibrillation with rapid ventricular response Referring Physician:   SAIR Timothy [name] is an 66 y.o. male.  HPI: the patient is a 66 year old Caucasian male with a history of paroxysmal atrial fibrillation status post ablation in 2008 2009. Prior to his ablation the patient on amiodarone for approximately 10 years.  He also is history of coronary disease sick sinus syndrome, hypertension. Patient had a stent placed to the LAD for single vessel occlusive disease in August of 1999 he has not had any catheterizations since. His last Myoview stress test was in June 2011 which was low risk. His last repeat echocardiogram was in May of 2000 tenant showed an EF of 55% with no significant valve abnormalities and mild MR with diastolic relaxation abnormality.  The patient reports that he's been having palpitations for approximately last month which lasted only a couple seconds and are intermittent. At approximately 1:30 hours this morning the patient woke up and his heart was pounding in his chest and it was irregular. There was no radiation of pain and he was complaining of some shortness of breath and a mild dizziness. He denies any recent illness, cough, congestion, nausea, vomiting, headache, vision change, lower extremity edema, abdominal pain, dysuria hematuria, hematochezia, melena.  When he presented to the emergency room he was in atrial fibrillation with rapid ventricular response heart rate in the 130s. He was started on IV diltiazem his heart rate responded appropriately and is currently down in the 80s.  Past Medical History  Diagnosis Date  . Atrial fibrillation   . Hypertension   . CAD (coronary artery disease)   . Hyperlipidemia     Past Surgical History  Procedure Date  . Cardiac electrophysiology study and ablation     Ablation  X  4  . Coronary angioplasty with stent placement     Stent  X  1    Family History  Problem Relation Age of Onset  . Coronary  artery disease Father   . Hypertension Father   . Diabetes Mother     Social History:  reports that he has quit smoking. His smoking use included Cigarettes. He smoked 1 pack per day. He does not have any smokeless tobacco history on file. He reports that he drinks alcohol. He reports that he does not use illicit drugs. Patient works as a Copy work.   Allergies:  Allergies  Allergen Reactions  . Nitroglycerin Other (See Comments)    "Blood Pressure goes to zero"    Medications:     . sodium chloride   Intravenous STAT  . aspirin EC  325 mg Oral Daily  . diltiazem (CARDIZEM) infusion  5-15 mg/hr Intravenous Once  . diltiazem      . heparin  4,000 Units Intravenous Once  . potassium chloride  10 mEq Intravenous Q1 Hr x 2  . potassium chloride      . sodium chloride  500 mL Intravenous Once  . sodium chloride  500 mL Intravenous Once  . sodium chloride  3 mL Intravenous Q12H  . DISCONTD: aspirin  325 mg Oral STAT     Results for orders placed during the hospital encounter of 06/13/11 (from the past 48 hour(s))  CBC     Status: Normal   Collection Time   06/13/11  1:03 AM      Component Value Range Comment   WBC 7.7  4.0 - 10.5 (K/uL)    RBC 5.00  4.22 - 5.81 (  MIL/uL)    Hemoglobin 15.7  13.0 - 17.0 (g/dL)    HCT 40.9  81.1 - 91.4 (%)    MCV 90.2  78.0 - 100.0 (fL)    MCH 31.4  26.0 - 34.0 (pg)    MCHC 34.8  30.0 - 36.0 (g/dL)    RDW 78.2  95.6 - 21.3 (%)    Platelets 237  150 - 400 (K/uL)   BASIC METABOLIC PANEL     Status: Abnormal   Collection Time   06/13/11  1:03 AM      Component Value Range Comment   Sodium 140  135 - 145 (mEq/L)    Potassium 3.2 (*) 3.5 - 5.1 (mEq/L)    Chloride 107  96 - 112 (mEq/L)    CO2 20  19 - 32 (mEq/L)    Glucose, Bld 117 (*) 70 - 99 (mg/dL)    BUN 17  6 - 23 (mg/dL)    Creatinine, Ser 0.86  0.50 - 1.35 (mg/dL)    Calcium 9.4  8.4 - 10.5 (mg/dL)    GFR calc non Af Amer >90  >90 (mL/min)    GFR calc  Af Amer >90  >90 (mL/min)   TROPONIN I     Status: Normal   Collection Time   06/13/11  1:03 AM      Component Value Range Comment   Troponin I <0.30  <0.30 (ng/mL)   MRSA PCR SCREENING     Status: Normal   Collection Time   06/13/11  4:05 AM      Component Value Range Comment   MRSA by PCR NEGATIVE  NEGATIVE    CARDIAC PANEL(CRET KIN+CKTOT+MB+TROPI)     Status: Normal   Collection Time   06/13/11  4:59 AM      Component Value Range Comment   Total CK 163  7 - 232 (U/L)    CK, MB 3.1  0.3 - 4.0 (ng/mL)    Troponin I <0.30  <0.30 (ng/mL)    Relative Index 1.9  0.0 - 2.5    BASIC METABOLIC PANEL     Status: Abnormal   Collection Time   06/13/11  5:00 AM      Component Value Range Comment   Sodium 142  135 - 145 (mEq/L)    Potassium 3.6  3.5 - 5.1 (mEq/L)    Chloride 108  96 - 112 (mEq/L)    CO2 23  19 - 32 (mEq/L)    Glucose, Bld 113 (*) 70 - 99 (mg/dL)    BUN 16  6 - 23 (mg/dL)    Creatinine, Ser 5.78  0.50 - 1.35 (mg/dL)    Calcium 8.5  8.4 - 10.5 (mg/dL)    GFR calc non Af Amer 90 (*) >90 (mL/min)    GFR calc Af Amer >90  >90 (mL/min)   CBC     Status: Normal   Collection Time   06/13/11  5:00 AM      Component Value Range Comment   WBC 8.0  4.0 - 10.5 (K/uL)    RBC 4.52  4.22 - 5.81 (MIL/uL)    Hemoglobin 14.1  13.0 - 17.0 (g/dL)    HCT 46.9  62.9 - 52.8 (%)    MCV 91.6  78.0 - 100.0 (fL)    MCH 31.2  26.0 - 34.0 (pg)    MCHC 34.1  30.0 - 36.0 (g/dL)    RDW 41.3  24.4 - 01.0 (%)    Platelets 243  150 - 400 (K/uL)   PROTIME-INR     Status: Normal   Collection Time   06/13/11  5:30 AM      Component Value Range Comment   Prothrombin Time 12.6  11.6 - 15.2 (seconds)    INR 0.92  0.00 - 1.49    APTT     Status: Normal   Collection Time   06/13/11  5:30 AM      Component Value Range Comment   aPTT 27  24 - 37 (seconds)   MAGNESIUM     Status: Normal   Collection Time   06/13/11  5:30 AM      Component Value Range Comment   Magnesium 1.9  1.5 - 2.5 (mg/dL)   LIPID  PANEL     Status: Abnormal   Collection Time   06/13/11  5:30 AM      Component Value Range Comment   Cholesterol 141  0 - 200 (mg/dL)    Triglycerides 161 (*) <150 (mg/dL)    HDL 44  >09 (mg/dL)    Total CHOL/HDL Ratio 3.2      VLDL 30  0 - 40 (mg/dL)    LDL Cholesterol 67  0 - 99 (mg/dL)     Dg Chest Portable 1 View  06/13/2011  *RADIOLOGY REPORT*  Clinical Data: Palpitations.  Shortness of breath.  History of atrial fibrillation.  PORTABLE CHEST - 1 VIEW  Comparison: None.  Findings: Slightly shallow inspiration.  Normal heart size and pulmonary vascularity for technique.  No focal airspace consolidation in the lungs.  No blunting of costophrenic angles. No pneumothorax.  Visualized bones appear grossly intact with degenerative changes in the thoracic spine.  IMPRESSION: No evidence of active pulmonary disease.  Original Report Authenticated By: Marlon Pel, M.D.    ROS Blood pressure 121/76, pulse 91, temperature 98.7 F (37.1 C), temperature source Oral, resp. rate 13, height 5\' 6"  (1.676 m), weight 85.3 kg (188 lb 0.8 oz), SpO2 98.00%. Physical Exam The patient is resting comfortably in no distress. Pupils equal round reactive to light accommodation, extra ocular movements intact, no scleral icterus. No cervical lymphadenopathy neck is nontender. Heart is irregular rate and rhythm, negative murmurs rubs or gallops. Lungs are clear to auscultation bilaterally negative wheezes or rhonchi. Abdomen is soft nontender positive bowel sounds in all quadrants. Pulses are 2+ in all extremities. There is a trace of lower extremity edema in the right leg. He has no carotid bruits. No cyanosis or clubbing. Strength is 5 out of 5 equal in upper lower extremities.  Skin is warm and dry.  Assessment/Plan: Patient Active Hospital Problem List: Atrial fibrillation with RVR (06/13/2011) Palpitations (06/13/2011) Chest pain (06/13/2011) Hypokalemia (06/13/2011) Hypertension (06/13/2011) CAD (coronary  artery disease) (06/13/2011) Hyperlipidemia (06/13/2011)  Plan:  We'll repeat EKG and order 2-D echocardiogram. Continue IV diltiazem and likely switch to by mouth tomorrow. We'll continue home dose of Lopressor with parameters. Continue heparin. Transfer to telemetry.  Further plan per MD.     Dwana Melena 06/13/2011, 9:05 AM

## 2011-06-13 NOTE — Plan of Care (Signed)
Problem: Consults Goal: Tobacco Cessation referral if indicated Outcome: Not Applicable Date Met:  06/13/11 Pt stated, "quit smoking 35 years ago"

## 2011-06-13 NOTE — Progress Notes (Signed)
  Echocardiogram 2D Echocardiogram has been performed.  Cathie Beams Deneen 06/13/2011, 12:22 PM

## 2011-06-14 LAB — BASIC METABOLIC PANEL
BUN: 11 mg/dL (ref 6–23)
CO2: 21 mEq/L (ref 19–32)
Calcium: 8.5 mg/dL (ref 8.4–10.5)
Creatinine, Ser: 0.83 mg/dL (ref 0.50–1.35)
Glucose, Bld: 109 mg/dL — ABNORMAL HIGH (ref 70–99)

## 2011-06-14 LAB — CBC
Hemoglobin: 14.9 g/dL (ref 13.0–17.0)
MCH: 31.5 pg (ref 26.0–34.0)
MCHC: 34.7 g/dL (ref 30.0–36.0)
MCV: 90.9 fL (ref 78.0–100.0)
RBC: 4.73 MIL/uL (ref 4.22–5.81)

## 2011-06-14 MED ORDER — DILTIAZEM LOAD VIA INFUSION
20.0000 mg | Freq: Once | INTRAVENOUS | Status: AC
  Administered 2011-06-14: 20 mg via INTRAVENOUS
  Filled 2011-06-14: qty 20

## 2011-06-14 MED ORDER — DILTIAZEM LOAD VIA INFUSION
20.0000 mg | Freq: Once | INTRAVENOUS | Status: DC
  Filled 2011-06-14: qty 20

## 2011-06-14 MED ORDER — DILTIAZEM HCL 100 MG IV SOLR
5.0000 mg/h | INTRAVENOUS | Status: DC
  Filled 2011-06-14: qty 100

## 2011-06-14 MED ORDER — ALPRAZOLAM 0.25 MG PO TABS: 0.2500 mg | ORAL_TABLET | Freq: Three times a day (TID) | ORAL | Status: DC | PRN

## 2011-06-14 MED ORDER — DILTIAZEM HCL 100 MG IV SOLR
5.0000 mg/h | INTRAVENOUS | Status: DC
  Administered 2011-06-14 (×3): 20 mg/h via INTRAVENOUS
  Filled 2011-06-14 (×3): qty 100

## 2011-06-14 NOTE — Progress Notes (Signed)
Pt. Seen and examined. Agree with the NP/PA-C note as written.  Cardizem d/c'd by nursing overnight .Marland Kitchen Now HR is in 130's with RVR.  Bolus 20 mg cardizem. Restart cardizem infusion at 20 mg/hr.  NPO p midnight, likely cardioversion tomorrow. Will be at steady state on Multaq by tomorrow.  Chrystie Nose, MD, [facility] Attending Cardiologist [facility]

## 2011-06-14 NOTE — Progress Notes (Addendum)
[facility]  Subjective: Feeling better.  Objective: Vital signs in last 24 hours: Temp:  [97.9 F (36.6 C)-98.4 F (36.9 C)] 97.9 F (36.6 C) (03/31 0800) Pulse Rate:  [59-102] 99  (03/30 2135) Resp:  [14-25] 19  (03/30 1930) BP: (109-129)/(54-95) 123/88 mmHg (03/31 0326) SpO2:  [94 %-99 %] 95 % (03/31 0326) Weight:  [85.3 kg (188 lb 0.8 oz)] 85.3 kg (188 lb 0.8 oz) (03/31 0500)    Intake/Output from previous day: 03/30 0701 - 03/31 0700 In: 3330 [P.O.:1200; I.V.:2130] Out: 1500 [Urine:1500] Intake/Output this shift:    Medications Current Facility-Administered Medications  Medication Dose Route Frequency Provider Last Rate Last Dose  . 0.9 %  sodium chloride infusion   Intravenous STAT Loren Racer, MD      . 0.9 %  sodium chloride infusion   Intravenous Continuous Ron Parker, MD 75 mL/hr at 06/14/11 0651 75 mL at 06/14/11 0651  . acetaminophen (TYLENOL) tablet 650 mg  650 mg Oral Q6H PRN Ron Parker, MD       Or  . acetaminophen (TYLENOL) suppository 650 mg  650 mg Rectal Q6H PRN Ron Parker, MD      . alum & mag hydroxide-simeth (MAALOX/MYLANTA) 200-200-20 MG/5ML suspension 30 mL  30 mL Oral Q6H PRN Ron Parker, MD      . aspirin EC tablet 325 mg  325 mg Oral Daily Ron Parker, MD   325 mg at 06/13/11 1045  . diltiazem (CARDIZEM) 100 mg in dextrose 5 % 100 mL infusion  20 mg/hr Intravenous Continuous Ron Parker, MD 20 mL/hr at 06/13/11 1045 20 mg/hr at 06/13/11 1045  . dronedarone (MULTAQ) tablet 400 mg  400 mg Oral BID WC Chrystie Nose, MD   400 mg at 06/13/11 1727  . heparin ADULT infusion 100 units/mL (25000 units/250 mL)  1,150 Units/hr Intravenous Continuous Santina Evans, PHARMD 11.5 mL/hr at 06/13/11 2247 1,150 Units/hr at 06/13/11 2247  . HYDROmorphone (DILAUDID) injection 0.5-1 mg  0.5-1 mg Intravenous Q3H PRN Ron Parker, MD      . metoprolol tartrate (LOPRESSOR) tablet  25 mg  25 mg Oral BID Dwana Melena, PA   25 mg at 06/13/11 2135  . ondansetron (ZOFRAN) tablet 4 mg  4 mg Oral Q6H PRN Ron Parker, MD       Or  . ondansetron (ZOFRAN) injection 4 mg  4 mg Intravenous Q6H PRN Ron Parker, MD      . oxyCODONE (Oxy IR/ROXICODONE) immediate release tablet 5 mg  5 mg Oral Q4H PRN Ron Parker, MD      . potassium chloride 10 MEQ/100ML IVPB           . sodium chloride 0.9 % injection 3 mL  3 mL Intravenous Q12H Ron Parker, MD      . zolpidem (AMBIEN) tablet 5 mg  5 mg Oral QHS PRN Ron Parker, MD        PE: General appearance: alert, cooperative and no distress Lungs: clear to auscultation bilaterally Heart: irregularly irregular rhythm Extremities: No LEE Pulses: Radials 2+  Lab Results:   Basename 06/14/11 0550 06/13/11 0500 06/13/11 0103  WBC 8.0 8.0 7.7  HGB 14.9 14.1 15.7  HCT 43.0 41.4 45.1  PLT 232 243 237   BMET  Basename 06/14/11 0550 06/13/11 0500 06/13/11 0103  NA 139 142 140  K 3.7 3.6 3.2*  CL 109 108 107  CO2 21 23 20   GLUCOSE 109* 113* 117*  BUN 11 16 17   CREATININE 0.83 0.87 0.79  CALCIUM 8.5 8.5 9.4   PT/INR  Basename 06/13/11 0530  LABPROT 12.6  INR 0.92   Cholesterol  Basename 06/13/11 0530  CHOL 141    Assessment/Plan  Principal Problem:  *Atrial fibrillation with RVR Active Problems:  Palpitations  Chest pain  Hypokalemia  Hypertension  CAD (coronary artery disease)  Hyperlipidemia  Plan:  Still in Afib- For some reason the diltiazem was stopped overnight by RN.  Heart rate in the 80's??  Restarting at 10mg /hr.  HR RVR  Bumped to 140's just with sitting up in bed.  2D echo complete.  Read pending.  On Multaq.  Will transfer to telemetry.  Will try to arrange DCCV for Monday.    LOS: 1 day    Timothy [name] W 06/14/2011 8:01 AM

## 2011-06-14 NOTE — Progress Notes (Signed)
ANTICOAGULATION CONSULT NOTE - Follow Up Consult  Pharmacy Consult for Heparin Indication: atrial fibrillation  Allergies  Allergen Reactions  . Nitroglycerin Other (See Comments)    "Blood Pressure goes to zero"    Patient Measurements: Height: 5\' 6"  (167.6 cm) Weight: 188 lb 0.8 oz (85.3 kg) IBW/kg (Calculated) : 63.8  Heparin Dosing Weight: 85.3 kg  Vital Signs: Temp: 97.9 F (36.6 C) (03/31 0800) Temp src: Oral (03/31 0800) BP: 123/88 mmHg (03/31 0326) Pulse Rate: 130  (03/31 0817)  Labs:  Alvira Philips 06/14/11 0550 06/13/11 2114 06/13/11 2113 06/13/11 1232 06/13/11 0530 06/13/11 0500 06/13/11 0459 06/13/11 0103  HGB 14.9 -- -- -- -- 14.1 -- --  HCT 43.0 -- -- -- -- 41.4 -- 45.1  PLT 232 -- -- -- -- 243 -- 237  APTT -- -- -- -- 27 -- -- --  LABPROT -- -- -- -- 12.6 -- -- --  INR -- -- -- -- 0.92 -- -- --  HEPARINUNFRC 0.66 0.46 -- 0.81* -- -- -- --  CREATININE 0.83 -- -- -- -- 0.87 -- 0.79  CKTOTAL -- -- 108 135 -- -- 163 --  CKMB -- -- 2.2 2.9 -- -- 3.1 --  TROPONINI -- -- <0.30 <0.30 -- -- <0.30 --   Estimated Creatinine Clearance: 93.3 ml/min (by C-G formula based on Cr of 0.83).   Medications:  Scheduled:     . sodium chloride   Intravenous STAT  . aspirin EC  325 mg Oral Daily  . dronedarone  400 mg Oral BID WC  . metoprolol tartrate  25 mg Oral BID  . potassium chloride      . sodium chloride  3 mL Intravenous Q12H   Infusions:     . sodium chloride 75 mL/hr at 06/14/11 0700  . diltiazem (CARDIZEM) infusion 20 mg/hr (06/14/11 0820)  . heparin 1,150 Units/hr (06/14/11 0700)    Assessment: 66 yo M admitted w/ afib. Marland Kitchen Heparin level at goal at 0.66. No bleeding issues noted. Rate currently at 1150 units/hr  Goal of Therapy:  Heparin level 0.3-0.7 units/ml   Plan:  1) Continue Heparin rate @ 1150 units/hr (11.15ml/hr) 2) monitor for accumulation 3) continue to monitor daily CBC and heparin levels  Janace Litten, PharmD   [phone] 06/14/2011,8:31 AM

## 2011-06-15 ENCOUNTER — Inpatient Hospital Stay (HOSPITAL_COMMUNITY): Payer: Commercial Managed Care - PPO | Admitting: Anesthesiology

## 2011-06-15 ENCOUNTER — Encounter (HOSPITAL_COMMUNITY)
Admission: EM | Disposition: A | Discharge status: Home / Self Care | Payer: Self-pay | Source: Home / Self Care | Attending: Internal Medicine

## 2011-06-15 ENCOUNTER — Encounter (HOSPITAL_COMMUNITY): Payer: Self-pay | Admitting: Anesthesiology

## 2011-06-15 DIAGNOSIS — I480 Paroxysmal atrial fibrillation: Secondary | ICD-10-CM | POA: Diagnosis present

## 2011-06-15 HISTORY — PX: CARDIOVERSION: SHX1299

## 2011-06-15 LAB — CBC
HCT: 41.6 % (ref 39.0–52.0)
MCH: 31 pg (ref 26.0–34.0)
MCHC: 33.9 g/dL (ref 30.0–36.0)
MCV: 91.4 fL (ref 78.0–100.0)
RDW: 13.7 % (ref 11.5–15.5)

## 2011-06-15 SURGERY — CARDIOVERSION
Anesthesia: General | Wound class: Clean

## 2011-06-15 MED ORDER — PROPOFOL 10 MG/ML IV BOLUS
INTRAVENOUS | Status: DC | PRN
  Administered 2011-06-15: 75 mg via INTRAVENOUS
  Administered 2011-06-15: 125 mg via INTRAVENOUS

## 2011-06-15 MED ORDER — METOPROLOL TARTRATE 25 MG PO TABS: 25.0000 mg | ORAL_TABLET | Freq: Two times a day (BID) | ORAL | Status: DC

## 2011-06-15 MED ORDER — ASPIRIN EC 81 MG PO TBEC: 81.0000 mg | DELAYED_RELEASE_TABLET | Freq: Every day | ORAL | Status: DC

## 2011-06-15 MED ORDER — ATORVASTATIN CALCIUM 20 MG PO TABS
20.0000 mg | ORAL_TABLET | Freq: Every day | ORAL | Status: DC
  Filled 2011-06-15: qty 1

## 2011-06-15 MED ORDER — DRONEDARONE HCL 400 MG PO TABS: 400.0000 mg | ORAL_TABLET | Freq: Two times a day (BID) | ORAL | Status: DC

## 2011-06-15 MED ORDER — SODIUM CHLORIDE 0.9 % IV SOLN
INTRAVENOUS | Status: DC | PRN
  Administered 2011-06-15: 13:00:00 via INTRAVENOUS

## 2011-06-15 MED ORDER — RIVAROXABAN 10 MG PO TABS
20.0000 mg | ORAL_TABLET | Freq: Every day | ORAL | Status: DC
  Administered 2011-06-15: 20 mg via ORAL
  Filled 2011-06-15: qty 2

## 2011-06-15 MED ORDER — ACETAMINOPHEN 325 MG PO TABS: 650.0000 mg | ORAL_TABLET | Freq: Four times a day (QID) | ORAL | Status: AC | PRN

## 2011-06-15 MED ORDER — RIVAROXABAN 20 MG PO TABS: 20.0000 mg | ORAL_TABLET | Freq: Every day | ORAL | Status: DC

## 2011-06-15 MED ORDER — ATORVASTATIN CALCIUM 20 MG PO TABS: 20.0000 mg | ORAL_TABLET | Freq: Every day | ORAL | Status: DC

## 2011-06-15 NOTE — Discharge Summary (Signed)
Patient ID: Timothy [name],  MRN: [id number], DOB/AGE: June 17, 1945 66 y.o.  Admit date: 06/13/2011 Discharge date: 06/15/2011  Primary Care Provider:  Primary Cardiologist: Dr Alanda Amass  Discharge Diagnoses  Principal Problem:  *Palpitations  Active Problems:  Atrial fibrillation with RVR, TEE/CV this admission Home on Multaq and Xarelto  Chest pain, in setting of rapid AF  CAD, LAD stent 1999, low risk Myoview June 2011  PAF, long Hx of PAF, s/p prior RFA, past Amio intol (skin)  Hyperlipidemia    Procedures: TEE/DCCV 06/15/11   Hospital Course  HPI: the patient is a 66 year old Caucasian male with a history of paroxysmal atrial fibrillation status post ablation in 2008 and 2009. Prior to his ablation the patient had been on amiodarone for approximately 10 years. This was ultimately stopped secondary to hyperpigmentation.  He also is history of coronary disease, sick sinus syndrome, hypertension. Patient had a stent placed to the LAD for single vessel occlusive disease in August of 1999 he has not had any catheterizations since. His last Myoview stress test was in June 2011 which was low risk. His last repeat echocardiogram was in May of 2000 tenant showed an EF of 55% with no significant valve abnormalities and mild MR with diastolic relaxation abnormality. The patient reports that he's been having palpitations for approximately last month which lasted only a couple seconds and are intermittent. On the morning the patient woke up and his heart was pounding in his chest and it was irregular. When he presented to the emergency room he was in atrial fibrillation with rapid ventricular response heart rate in the 130s. He was started on IV diltiazem his heart rate responded appropriately and is currently down in the 80s. He was not on Coumadin prior to admission. He was started on IV Heparin. It was decided to start Multaq and plan TEE CV. This was done by Dr Rennis Golden 06/15/11 without complications. He  is discharged on Xarelto. He will follow up with Dr Alanda Amass as an OP. He may need a 3d RFA.   Discharge Vitals:  Blood pressure 115/79, pulse 90, temperature 98 F (36.7 C), temperature source Oral, resp. rate 18, height 5\' 6"  (1.676 m), weight 85.3 kg (188 lb 0.8 oz), SpO2 97.00%.    Labs: Results for orders placed during the hospital encounter of 06/13/11 (from the past 48 hour(s))  CARDIAC PANEL(CRET KIN+CKTOT+MB+TROPI)     Status: Normal   Collection Time   06/13/11  9:13 PM      Component Value Range Comment   Total CK 108  7 - 232 (U/L)    CK, MB 2.2  0.3 - 4.0 (ng/mL)    Troponin I <0.30  <0.30 (ng/mL)    Relative Index 2.0  0.0 - 2.5    HEPARIN LEVEL (UNFRACTIONATED)     Status: Normal   Collection Time   06/13/11  9:14 PM      Component Value Range Comment   Heparin Unfractionated 0.46  0.30 - 0.70 (IU/mL)   HEPARIN LEVEL (UNFRACTIONATED)     Status: Normal   Collection Time   06/14/11  5:50 AM      Component Value Range Comment   Heparin Unfractionated 0.66  0.30 - 0.70 (IU/mL)   CBC     Status: Normal   Collection Time   06/14/11  5:50 AM      Component Value Range Comment   WBC 8.0  4.0 - 10.5 (K/uL)    RBC 4.73  4.22 - 5.81 (MIL/uL)  Hemoglobin 14.9  13.0 - 17.0 (g/dL)    HCT 45.4  09.8 - 11.9 (%)    MCV 90.9  78.0 - 100.0 (fL)    MCH 31.5  26.0 - 34.0 (pg)    MCHC 34.7  30.0 - 36.0 (g/dL)    RDW 14.7  82.9 - 56.2 (%)    Platelets 232  150 - 400 (K/uL)   BASIC METABOLIC PANEL     Status: Abnormal   Collection Time   06/14/11  5:50 AM      Component Value Range Comment   Sodium 139  135 - 145 (mEq/L)    Potassium 3.7  3.5 - 5.1 (mEq/L)    Chloride 109  96 - 112 (mEq/L)    CO2 21  19 - 32 (mEq/L)    Glucose, Bld 109 (*) 70 - 99 (mg/dL)    BUN 11  6 - 23 (mg/dL)    Creatinine, Ser 1.30  0.50 - 1.35 (mg/dL)    Calcium 8.5  8.4 - 10.5 (mg/dL)    GFR calc non Af Amer >90  >90 (mL/min)    GFR calc Af Amer >90  >90 (mL/min)   HEPARIN LEVEL (UNFRACTIONATED)      Status: Normal   Collection Time   06/15/11  5:30 AM      Component Value Range Comment   Heparin Unfractionated 0.40  0.30 - 0.70 (IU/mL)   CBC     Status: Normal   Collection Time   06/15/11  5:30 AM      Component Value Range Comment   WBC 8.3  4.0 - 10.5 (K/uL)    RBC 4.55  4.22 - 5.81 (MIL/uL)    Hemoglobin 14.1  13.0 - 17.0 (g/dL)    HCT 86.5  78.4 - 69.6 (%)    MCV 91.4  78.0 - 100.0 (fL)    MCH 31.0  26.0 - 34.0 (pg)    MCHC 33.9  30.0 - 36.0 (g/dL)    RDW 29.5  28.4 - 13.2 (%)    Platelets 234  150 - 400 (K/uL)     Disposition:  Follow-up Information    Follow up with Governor Rooks, MD. (office will call)    Contact information:   [address]  [place] [address] [phone]          Discharge Medications:  Medication List  As of 06/15/2011  3:23 PM   STOP taking these medications         simvastatin 40 MG tablet         TAKE these medications         acetaminophen 325 MG tablet   Commonly known as: TYLENOL   Take 2 tablets (650 mg total) by mouth every 6 (six) hours as needed (or Fever >/= 101).      aspirin 81 MG chewable tablet   Chew 81 mg by mouth daily.      atorvastatin 20 MG tablet   Commonly known as: LIPITOR   Take 1 tablet (20 mg total) by mouth daily at 6 PM.      dronedarone 400 MG tablet   Commonly known as: MULTAQ   Take 1 tablet (400 mg total) by mouth 2 (two) times daily with a meal.      metoprolol tartrate 25 MG tablet   Commonly known as: LOPRESSOR   Take 1 tablet (25 mg total) by mouth 2 (two) times daily.  Rivaroxaban 20 MG Tabs   Take 20 mg by mouth daily.             Duration of Discharge Encounter: Greater than 30 minutes including physician time.  Jolene Provost PA-C 06/15/2011 3:23 PM

## 2011-06-15 NOTE — Progress Notes (Signed)
Pt. Seen and examined. Agree with the NP/PA-C note as written.  Plan cardioversion today at 1pm. No TEE is necessary.  Will likely d/c home on xarelto.  Chrystie Nose, MD, [facility] Attending Cardiologist [facility]

## 2011-06-15 NOTE — Preoperative (Signed)
Beta Blockers   Reason not to administer Beta Blockers:Not Applicable 

## 2011-06-15 NOTE — Discharge Instructions (Signed)
Atrial Fibrillation Your caregiver has diagnosed you with atrial fibrillation (AFib). The heart normally beats very regularly; AFib is a type of irregular heartbeat. The heart rate may be faster or slower than normal. This can prevent your heart from pumping as well as it should. AFib can be constant (chronic) or intermittent (paroxysmal). CAUSES  Atrial fibrillation may be caused by:  Heart disease, including heart attack, coronary artery disease, heart failure, diseases of the heart valves, and others.   Blood clot in the lungs (pulmonary embolism).   Pneumonia or other infections.   Chronic lung disease.   Thyroid disease.   Toxins. These include alcohol, some medications (such as decongestant medications or diet pills), and caffeine.  In some people, no cause for AFib can be found. This is referred to as Lone Atrial Fibrillation. SYMPTOMS   Palpitations or a fluttering in your chest.   A vague sense of chest discomfort.   Shortness of breath.   Sudden onset of lightheadedness or weakness.  Sometimes, the first sign of AFib can be a complication of the condition. This could be a stroke or heart failure. DIAGNOSIS  Your description of your condition may make your caregiver suspicious of atrial fibrillation. Your caregiver will examine your pulse to determine if fibrillation is present. An EKG (electrocardiogram) will confirm the diagnosis. Further testing may help determine what caused you to have atrial fibrillation. This may include chest x-ray, echocardiogram, blood tests, or CT scans. PREVENTION  If you have previously had atrial fibrillation, your caregiver may advise you to avoid substances known to cause the condition (such as stimulant medications, and possibly caffeine or alcohol). You may be advised to use medications to prevent recurrence. Proper treatment of any underlying condition is important to help prevent recurrence. PROGNOSIS  Atrial fibrillation does tend to  become a chronic condition over time. It can cause significant complications (see below). Atrial fibrillation is not usually immediately life-threatening, but it can shorten your life expectancy. This seems to be worse in women. If you have lone atrial fibrillation and are under 60 years old, the risk of complications is very low, and life expectancy is not shortened. RISKS AND COMPLICATIONS  Complications of atrial fibrillation can include stroke, chest pain, and heart failure. Your caregiver will recommend treatments for the atrial fibrillation, as well as for any underlying conditions, to help minimize risk of complications. TREATMENT  Treatment for AFib is divided into several categories:  Treatment of any underlying condition.   Converting you out of AFib into a regular (sinus) rhythm.   Controlling rapid heart rate.   Prevention of blood clots and stroke.  Medications and procedures are available to convert your atrial fibrillation to sinus rhythm. However, recent studies have shown that this may not offer you any advantage, and cardiac experts are continuing research and debate on this topic. More important is controlling your rapid heartbeat. The rapid heartbeat causes more symptoms, and places strain on your heart. Your caregiver will advise you on the use of medications that can control your heart rate. Atrial fibrillation is a strong stroke risk. You can lessen this risk by taking blood thinning medications such as Coumadin (warfarin), or sometimes aspirin. These medications need close monitoring by your caregiver. Over-medication can cause bleeding. Too little medication may not protect against stroke. HOME CARE INSTRUCTIONS   If your caregiver prescribed medicine to make your heartbeat more normally, take as directed.   If blood thinners were prescribed by your caregiver, take EXACTLY as directed.     Perform blood tests EXACTLY as directed.   Quit smoking. Smoking increases your  cardiac and lung (pulmonary) risks.   DO NOT drink alcohol.   DO NOT drink caffeinated drinks (e.g. coffee, soda, chocolate, and leaf teas). You may drink decaffeinated coffee, soda or tea.   If you are overweight, you should choose a reduced calorie diet to lose weight. Please see a registered dietitian if you need more information about healthy weight loss. DO NOT USE DIET PILLS as they may aggravate heart problems.   If you have other heart problems that are causing AFib, you may need to eat a low salt, fat, and cholesterol diet. Your caregiver will tell you if this is necessary.   Exercise every day to improve your physical fitness. Stay active unless advised otherwise.   If your caregiver has given you a follow-up appointment, it is very important to keep that appointment. Not keeping the appointment could result in heart failure or stroke. If there is any problem keeping the appointment, you must call back to this facility for assistance.  SEEK MEDICAL CARE IF:  You notice a change in the rate, rhythm or strength of your heartbeat.   You develop an infection or any other change in your overall health status.  SEEK IMMEDIATE MEDICAL CARE IF:   You develop chest pain, abdominal pain, sweating, weakness or feel sick to your stomach (nausea).   You develop shortness of breath.   You develop swollen feet and ankles.   You develop dizziness, numbness, or weakness of your face or limbs, or any change in vision or speech.  MAKE SURE YOU:   Understand these instructions.   Will watch your condition.   Will get help right away if you are not doing well or get worse.  Document Released: 03/02/2005 Document Revised: 02/19/2011 Document Reviewed: 10/05/2007 Exit[employer] Patient Information 2012 [employer]. 

## 2011-06-15 NOTE — Progress Notes (Signed)
Discharge instructions reviewed with and given to patient and family who verbalized understanding, skin intact, no complaints of pain, IV sites discontinued per hospital protocol, sites intact. Xeralto 20mg  given to patient prior to discharge.  Patient discharged with family via staff/wheelchair.  Aarvi Stotts Terral

## 2011-06-15 NOTE — Progress Notes (Signed)
UR Completed. Simmons, Trichelle Lehan F [phone]  

## 2011-06-15 NOTE — Discharge Summary (Signed)
Daniyah Fohl C. Sharnika Binney, MD, FACC Attending Cardiologist [facility]  

## 2011-06-15 NOTE — Progress Notes (Signed)
ANTICOAGULATION CONSULT NOTE - Follow Up Consult  Pharmacy Consult for Heparin Indication: atrial fibrillation  Allergies  Allergen Reactions  . Nitroglycerin Other (See Comments)    "Blood Pressure goes to zero"    Patient Measurements: Height: 5\' 6"  (167.6 cm) Weight: 188 lb 0.8 oz (85.3 kg) IBW/kg (Calculated) : 63.8   Vital Signs: Temp: 98 F (36.7 C) (04/01 0800) BP: 118/79 mmHg (04/01 0800) Pulse Rate: 78  (04/01 0800)  Labs:  Alvira Philips 06/15/11 0530 06/14/11 0550 06/13/11 2114 06/13/11 2113 06/13/11 1232 06/13/11 0530 06/13/11 0500 06/13/11 0459 06/13/11 0103  HGB 14.1 14.9 -- -- -- -- -- -- --  HCT 41.6 43.0 -- -- -- -- 41.4 -- --  PLT 234 232 -- -- -- -- 243 -- --  APTT -- -- -- -- -- 27 -- -- --  LABPROT -- -- -- -- -- 12.6 -- -- --  INR -- -- -- -- -- 0.92 -- -- --  HEPARINUNFRC 0.40 0.66 0.46 -- -- -- -- -- --  CREATININE -- 0.83 -- -- -- -- 0.87 -- 0.79  CKTOTAL -- -- -- 108 135 -- -- 163 --  CKMB -- -- -- 2.2 2.9 -- -- 3.1 --  TROPONINI -- -- -- <0.30 <0.30 -- -- <0.30 --   Estimated Creatinine Clearance: 93.3 ml/min (by C-G formula based on Cr of 0.83).   Medications:  Scheduled:    . aspirin EC  325 mg Oral Daily  . dronedarone  400 mg Oral BID WC  . metoprolol tartrate  25 mg Oral BID  . sodium chloride  3 mL Intravenous Q12H    Assessment: 66yo male with AFib, for cardioversion today.  Heparin level is therapeutic on current rate of 1150 units/hr and CBC is stable.  Home meds have been resumed, except Zocor.  Pt is now on Dronedarone, which may cause increased levels/effect of Zocor.  Goal of Therapy:  Heparin level 0.3-0.7 units/ml   Plan:  1.  Continue current rate of heparin 2.  Resume Zocor when appropriate; he may need a smaller dose.  Marisue Humble, PharmD Clinical Pharmacist [facility]

## 2011-06-15 NOTE — Anesthesia Preprocedure Evaluation (Addendum)
Anesthesia Evaluation  Patient identified by MRN, date of birth, ID band Patient awake, Patient confused and Patient unresponsive    Reviewed: Allergy & Precautions, H&P , NPO status , Patient's Chart, lab work & pertinent test results, reviewed documented beta blocker date and time   Airway Mallampati: II TM Distance: >3 FB Neck ROM: Full    Dental  (+) Teeth Intact   Pulmonary          Cardiovascular hypertension, Pt. on medications and Pt. on home beta blockers + CAD + dysrhythmias Atrial Fibrillation Rhythm:Irregular     Neuro/Psych    GI/Hepatic   Endo/Other    Renal/GU      Musculoskeletal   Abdominal   Peds  Hematology   Anesthesia Other Findings   Reproductive/Obstetrics                           Anesthesia Physical Anesthesia Plan  ASA: III  Anesthesia Plan: General   Post-op Pain Management:    Induction: Intravenous  Airway Management Planned: Mask  Additional Equipment:   Intra-op Plan:   Post-operative Plan:   Informed Consent: I have reviewed the patients History and Physical, chart, labs and discussed the procedure including the risks, benefits and alternatives for the proposed anesthesia with the patient or authorized representative who has indicated his/her understanding and acceptance.     Plan Discussed with: CRNA and Surgeon  Anesthesia Plan Comments:         Anesthesia Quick Evaluation

## 2011-06-15 NOTE — Progress Notes (Signed)
Subjective:  No complaits  Objective:  Vital Signs in the last 24 hours: Temp:  [97.5 F (36.4 C)-98.1 F (36.7 C)] 98 F (36.7 C) (04/01 0800) Pulse Rate:  [51-90] 90  (04/01 1003) Resp:  [18-20] 18  (04/01 0800) BP: (95-118)/(62-82) 115/79 mmHg (04/01 1003) SpO2:  [96 %-99 %] 97 % (04/01 0800)  Intake/Output from previous day:  Intake/Output Summary (Last 24 hours) at 06/15/11 1015 Last data filed at 06/15/11 1005  Gross per 24 hour  Intake 2418.33 ml  Output      0 ml  Net 2418.33 ml    Physical Exam: General appearance: alert, cooperative and no distress Lungs: clear to auscultation bilaterally Heart: irregularly irregular rhythm   Rate: 88  Rhythm: atrial fibrillation  Lab Results:  Basename 06/15/11 0530 06/14/11 0550  WBC 8.3 8.0  HGB 14.1 14.9  PLT 234 232    Basename 06/14/11 0550 06/13/11 0500  NA 139 142  K 3.7 3.6  CL 109 108  CO2 21 23  GLUCOSE 109* 113*  BUN 11 16  CREATININE 0.83 0.87    Basename 06/13/11 2113 06/13/11 1232  TROPONINI <0.30 <0.30   Hepatic Function Panel No results found for this basename: PROT,ALBUMIN,AST,ALT,ALKPHOS,BILITOT,BILIDIR,IBILI in the last 72 hours  Basename 06/13/11 0530  CHOL 141    Basename 06/13/11 0530  INR 0.92    Imaging: Imaging results have been reviewed  Cardiac Studies: 2D shows good LVF, mild LAE  Assessment/Plan:   Principal Problem:  *Palpitations Active Problems:  Atrial fibrillation with RVR  Chest pain, in setting of rapid AF  CAD, LAD stent 1999, low risk Myoview June 2011  PAF, long Hx of PAF, s/p prior RFA, past Amio intol (skin)  Hyperlipidemia  Plan- Multaq added. IV Diltiazem stopped again secondary to low HR last night. He is for TEE/DCCV today 1pm. Will change Zocor to Lipitor as he is on Multaq, check TSH, LFTS prior to discharge. He will need anticoagulation at discharge.    [employer] PA-C 06/15/2011, 10:15 AM

## 2011-06-15 NOTE — Transfer of Care (Addendum)
Immediate Anesthesia Transfer of Care Note  Patient: Timothy [name]  Procedure(s) Performed: Procedure(s) (LRB): CARDIOVERSION (N/A)  Patient Location: PACU and Nursing Unit  Anesthesia Type: MAC  Level of Consciousness: awake, alert  and oriented  Airway & Oxygen Therapy: Patient Spontanous Breathing and Patient connected to nasal cannula oxygen  Post-op Assessment: Report given to PACU RN, Post -op Vital signs reviewed and stable and Patient moving all extremities  Post vital signs: Reviewed and stable  Complications: No apparent anesthesia complications

## 2011-06-15 NOTE — Op Note (Signed)
[facility]  CARDIOVERSION NOTE   Procedure: Electrical Cardioversion Indications:  Atrial Fibrillation  Procedure Details:  Consent: Risks of procedure as well as the alternatives and risks of each were explained to the (patient/caregiver).  Consent for procedure obtained.  Time Out: Verified patient identification, verified procedure, site/side was marked, verified correct patient position, special equipment/implants available, medications/allergies/relevent history reviewed, required imaging and test results available.  Performed  Patient placed on cardiac monitor, pulse oximetry, supplemental oxygen as necessary.  Sedation given: IV sedation per anesthesia Pacer pads placed anterior and posterior chest.  Cardioverted 2 time(s).  Cardioverted at 150J and 200J biphasic.   Evaluation: Findings: Post procedure EKG shows: Atrial Fibrillation Complications: None Patient did tolerate procedure well.  Unfortunately, cardioversion to sinus was unsuccessful.  PLAN: 1. Recommend starting xarelto 20 mg daily at discharge.  2. Continue multaq 400 mg po BID. 3. Continue lopressor 25 mg po BID for rate control. 4. Likely okay for discharge later today. Follow-up with Dr. Alanda Amass as outpatient - will likely need referral to [facility] or [facility] for repeat ablation (x3).  Time Spent Directly with the Patient:  30 minutes   Chrystie Nose, MD, [facility] Attending Cardiologist [facility]  Jasman Murri C 06/15/2011, 1:07 PM

## 2011-06-15 NOTE — Anesthesia Postprocedure Evaluation (Signed)
  Anesthesia Post-op Note  Patient: LANDRUM CARBONELL  Procedure(s) Performed: Procedure(s) (LRB): CARDIOVERSION (N/A)  Patient Location: Nursing Unit  Anesthesia Type: MAC  Level of Consciousness: awake, alert  and oriented  Airway and Oxygen Therapy: Patient Spontanous Breathing and Patient connected to nasal cannula oxygen  Post-op Pain: none  Post-op Assessment: Post-op Vital signs reviewed, Patient's Cardiovascular Status Stable, Respiratory Function Stable, Patent Airway, No signs of Nausea or vomiting, Pain level controlled and No backache  Post-op Vital Signs: Reviewed and stable  Complications: No apparent anesthesia complications

## 2011-06-17 ENCOUNTER — Encounter (HOSPITAL_COMMUNITY): Payer: Self-pay | Admitting: Internal Medicine

## 2011-07-15 HISTORY — PX: NM MYOCAR PERF WALL MOTION: HXRAD629

## 2012-08-09 ENCOUNTER — Telehealth: Payer: Self-pay | Admitting: Cardiovascular Disease

## 2012-08-09 NOTE — Telephone Encounter (Signed)
Right shoulder is still not any better-Dr Alanda Amass told him if it didn't get any better he would refer him to an Orthopedic-He needs him to refer him to one please!

## 2012-08-09 NOTE — Telephone Encounter (Signed)
Pt. Told to go to [facility]. Or call and make an appt.

## 2012-08-09 NOTE — Telephone Encounter (Signed)
Message forwarded to [facility]. Berlinda Last, LPN to discuss w/ Dr. Alanda Amass.  Paper Char given to JC.

## 2012-09-01 ENCOUNTER — Telehealth: Payer: Self-pay | Admitting: Cardiovascular Disease

## 2012-09-01 NOTE — Telephone Encounter (Signed)
Call him at home .if not there, call on his cell phone-He wants you to call -he is scheduled to have surgery-Wants to know if he can stop his taking his blood thinner?

## 2012-09-01 NOTE — Telephone Encounter (Signed)
Pt. Called no answer left message to let us know when he suppose to have his surgery and also the time that Dr. Alanda Amass will be out of the office.

## 2012-09-19 ENCOUNTER — Telehealth: Payer: Self-pay | Admitting: Cardiovascular Disease

## 2012-09-19 NOTE — Telephone Encounter (Signed)
Pt was calling regarding his Sx clearance that [facility] has sent over. They told him that it was sent over twice with no reply yet. Can someone please check on this .

## 2012-09-20 NOTE — Telephone Encounter (Signed)
Form received and faxed by Wilburn Cornelia, LPN.

## 2012-09-20 NOTE — Telephone Encounter (Signed)
Returned call.  Pt informed and verbalized understanding.

## 2012-10-13 ENCOUNTER — Telehealth: Payer: Self-pay | Admitting: Cardiovascular Disease

## 2012-10-13 NOTE — Telephone Encounter (Signed)
Need to change back to Coumadin or Warfarin-His ins will not pay for Xarelto.Marland Kitchen

## 2012-10-19 NOTE — Telephone Encounter (Signed)
See previous note

## 2012-10-20 NOTE — Telephone Encounter (Signed)
Reviewed pt's chart.  He brought in a letter earlier this month and his Xarelto just needs a PA.  Called company and Xarelto is approved.  Pt is aware he can go to the pharmacy and pick it up.

## 2012-10-24 DIAGNOSIS — S43429A Sprain of unspecified rotator cuff capsule, initial encounter: Secondary | ICD-10-CM | POA: Diagnosis not present

## 2012-10-24 DIAGNOSIS — M948X9 Other specified disorders of cartilage, unspecified sites: Secondary | ICD-10-CM | POA: Diagnosis not present

## 2012-10-24 DIAGNOSIS — M719 Bursopathy, unspecified: Secondary | ICD-10-CM | POA: Diagnosis not present

## 2012-10-24 DIAGNOSIS — M19019 Primary osteoarthritis, unspecified shoulder: Secondary | ICD-10-CM | POA: Diagnosis not present

## 2012-10-24 DIAGNOSIS — M24119 Other articular cartilage disorders, unspecified shoulder: Secondary | ICD-10-CM | POA: Diagnosis not present

## 2012-10-24 DIAGNOSIS — G8918 Other acute postprocedural pain: Secondary | ICD-10-CM | POA: Diagnosis not present

## 2012-10-24 DIAGNOSIS — M66329 Spontaneous rupture of flexor tendons, unspecified upper arm: Secondary | ICD-10-CM | POA: Diagnosis not present

## 2012-10-24 DIAGNOSIS — M659 Synovitis and tenosynovitis, unspecified: Secondary | ICD-10-CM | POA: Diagnosis not present

## 2012-10-24 DIAGNOSIS — M7500 Adhesive capsulitis of unspecified shoulder: Secondary | ICD-10-CM | POA: Diagnosis not present

## 2012-10-24 DIAGNOSIS — M67919 Unspecified disorder of synovium and tendon, unspecified shoulder: Secondary | ICD-10-CM | POA: Diagnosis not present

## 2012-10-27 DIAGNOSIS — M25519 Pain in unspecified shoulder: Secondary | ICD-10-CM | POA: Diagnosis not present

## 2012-11-01 DIAGNOSIS — M25519 Pain in unspecified shoulder: Secondary | ICD-10-CM | POA: Diagnosis not present

## 2012-11-03 DIAGNOSIS — M25519 Pain in unspecified shoulder: Secondary | ICD-10-CM | POA: Diagnosis not present

## 2012-11-03 DIAGNOSIS — Z4889 Encounter for other specified surgical aftercare: Secondary | ICD-10-CM | POA: Diagnosis not present

## 2012-11-08 DIAGNOSIS — M25519 Pain in unspecified shoulder: Secondary | ICD-10-CM | POA: Diagnosis not present

## 2012-11-10 DIAGNOSIS — M25519 Pain in unspecified shoulder: Secondary | ICD-10-CM | POA: Diagnosis not present

## 2012-11-15 DIAGNOSIS — M25519 Pain in unspecified shoulder: Secondary | ICD-10-CM | POA: Diagnosis not present

## 2012-11-17 DIAGNOSIS — M25519 Pain in unspecified shoulder: Secondary | ICD-10-CM | POA: Diagnosis not present

## 2012-11-22 DIAGNOSIS — M25519 Pain in unspecified shoulder: Secondary | ICD-10-CM | POA: Diagnosis not present

## 2012-11-24 DIAGNOSIS — M25519 Pain in unspecified shoulder: Secondary | ICD-10-CM | POA: Diagnosis not present

## 2012-11-29 DIAGNOSIS — M25519 Pain in unspecified shoulder: Secondary | ICD-10-CM | POA: Diagnosis not present

## 2012-12-01 DIAGNOSIS — M25519 Pain in unspecified shoulder: Secondary | ICD-10-CM | POA: Diagnosis not present

## 2012-12-06 ENCOUNTER — Other Ambulatory Visit: Payer: Self-pay | Admitting: Cardiology

## 2012-12-06 DIAGNOSIS — M25519 Pain in unspecified shoulder: Secondary | ICD-10-CM | POA: Diagnosis not present

## 2012-12-07 NOTE — Telephone Encounter (Signed)
Rx was sent to pharmacy electronically. 

## 2012-12-08 DIAGNOSIS — M25519 Pain in unspecified shoulder: Secondary | ICD-10-CM | POA: Diagnosis not present

## 2012-12-13 DIAGNOSIS — M25519 Pain in unspecified shoulder: Secondary | ICD-10-CM | POA: Diagnosis not present

## 2012-12-15 DIAGNOSIS — M25519 Pain in unspecified shoulder: Secondary | ICD-10-CM | POA: Diagnosis not present

## 2012-12-20 DIAGNOSIS — M25519 Pain in unspecified shoulder: Secondary | ICD-10-CM | POA: Diagnosis not present

## 2012-12-22 DIAGNOSIS — M25519 Pain in unspecified shoulder: Secondary | ICD-10-CM | POA: Diagnosis not present

## 2012-12-27 DIAGNOSIS — M25519 Pain in unspecified shoulder: Secondary | ICD-10-CM | POA: Diagnosis not present

## 2012-12-29 DIAGNOSIS — M25519 Pain in unspecified shoulder: Secondary | ICD-10-CM | POA: Diagnosis not present

## 2013-01-03 DIAGNOSIS — M25519 Pain in unspecified shoulder: Secondary | ICD-10-CM | POA: Diagnosis not present

## 2013-01-05 ENCOUNTER — Ambulatory Visit (INDEPENDENT_AMBULATORY_CARE_PROVIDER_SITE_OTHER): Payer: Medicare Other | Admitting: Cardiovascular Disease

## 2013-01-05 VITALS — BP 150/90 | HR 62 | Ht 66.0 in | Wt 209.2 lb

## 2013-01-05 DIAGNOSIS — I2510 Atherosclerotic heart disease of native coronary artery without angina pectoris: Secondary | ICD-10-CM

## 2013-01-05 DIAGNOSIS — I4891 Unspecified atrial fibrillation: Secondary | ICD-10-CM

## 2013-01-05 DIAGNOSIS — E785 Hyperlipidemia, unspecified: Secondary | ICD-10-CM

## 2013-01-05 DIAGNOSIS — M25519 Pain in unspecified shoulder: Secondary | ICD-10-CM | POA: Diagnosis not present

## 2013-01-05 DIAGNOSIS — I480 Paroxysmal atrial fibrillation: Secondary | ICD-10-CM

## 2013-01-05 DIAGNOSIS — I10 Essential (primary) hypertension: Secondary | ICD-10-CM

## 2013-01-05 MED ORDER — LISINOPRIL 5 MG PO TABS: 5.0000 mg | ORAL_TABLET | Freq: Every day | ORAL | Status: DC

## 2013-01-05 NOTE — Patient Instructions (Signed)
Your physician recommends that you return for lab work in: 2 WEEKS. Your physician has recommended you make the following change in your medication: start new prescription for lisinopril. This has already been sent to the pharmacy. Your physician recommends that you schedule a follow-up appointment in: 3 MONTHS.

## 2013-01-10 DIAGNOSIS — M25519 Pain in unspecified shoulder: Secondary | ICD-10-CM | POA: Diagnosis not present

## 2013-01-12 DIAGNOSIS — M25519 Pain in unspecified shoulder: Secondary | ICD-10-CM | POA: Diagnosis not present

## 2013-01-17 DIAGNOSIS — M25519 Pain in unspecified shoulder: Secondary | ICD-10-CM | POA: Diagnosis not present

## 2013-01-18 ENCOUNTER — Other Ambulatory Visit: Payer: Self-pay | Admitting: Cardiology

## 2013-01-24 DIAGNOSIS — I2510 Atherosclerotic heart disease of native coronary artery without angina pectoris: Secondary | ICD-10-CM | POA: Diagnosis not present

## 2013-01-24 DIAGNOSIS — M25519 Pain in unspecified shoulder: Secondary | ICD-10-CM | POA: Diagnosis not present

## 2013-01-24 LAB — BASIC METABOLIC PANEL
CO2: 24 mEq/L (ref 19–32)
Calcium: 9.2 mg/dL (ref 8.4–10.5)
Chloride: 104 mEq/L (ref 96–112)
Creat: 0.92 mg/dL (ref 0.50–1.35)
Glucose, Bld: 98 mg/dL (ref 70–99)
Potassium: 4.5 mEq/L (ref 3.5–5.3)
Sodium: 139 mEq/L (ref 135–145)

## 2013-01-26 DIAGNOSIS — M25519 Pain in unspecified shoulder: Secondary | ICD-10-CM | POA: Diagnosis not present

## 2013-01-31 DIAGNOSIS — M25519 Pain in unspecified shoulder: Secondary | ICD-10-CM | POA: Diagnosis not present

## 2013-02-02 DIAGNOSIS — M25519 Pain in unspecified shoulder: Secondary | ICD-10-CM | POA: Diagnosis not present

## 2013-02-05 ENCOUNTER — Encounter: Payer: Self-pay | Admitting: Cardiovascular Disease

## 2013-02-05 DIAGNOSIS — I10 Essential (primary) hypertension: Secondary | ICD-10-CM | POA: Insufficient documentation

## 2013-02-05 NOTE — Progress Notes (Signed)
Patient ID: Timothy [name], male   DOB: [birth year], 67 y.o.   MRN: [id number]     PATIENT PROFILE:  Timothy [name] is a 67 year old male who is a former patient of Dr. Susa Griffins. The patient now presents to establish cardiology care with me after Dr. Alanda Amass retired from his solo practice.   HPI: Timothy [name] is a 67 year old gentleman who is retired from [employer] business. He has established coronary artery disease in 1999 underwent a self-expanding radius stent to his LAD. His last nuclear perfusion study was in May 2013 which was low risk and without ischemia an echo Doppler study showed an ejection fraction in the 55-60% range. He had mild LA dilatation. The patient has a history of sick sinus syndrome with paroxysmal atrial fibrillation and remotely had been on amiodarone but developed skin toxicity secondary to do that secondary to this. Apparently he has undergone 2 radiofrequency ablations at [facility] by Dr. Sampson Goon initially in February 2009 and he required a second procedure in December 2009 for recurrent atrial fibrillation. He had developed a recurrent episode of atrial fibrillation several years ago at which time he underwent cardioversion and has been maintaining sinus rhythm with a rate coagulation therapy.  He last saw Dr. Alanda Amass in March 2014 at which time he was felt to be stable on his current medical regimen which consisted of Lopressor 25 twice a day multiform 100 twice a day Zaroxolyn 20 mg daily in addition to his atorvastatin 20 mg and fish oil for hyperlipidemia. At that time, he did have sinusitis and URI symptomatology for which Dr. Alanda Amass did treat.  Since the patient last saw Dr. Alanda Amass, he denies any awareness of tachycardia palpitations. He denies chest pressure. Apparently he did undergo rotator cuff surgery and surgery of his bicep tendon.  Past Medical History  Diagnosis Date  . Atrial fibrillation   . Hypertension   . CAD  (coronary artery disease)   . Hyperlipidemia     Past Surgical History  Procedure Laterality Date  . Cardiac electrophysiology study and ablation      Ablation  X  4  . Coronary angioplasty with stent placement      Stent  X  1  . Cardioversion  06/15/2011    Procedure: CARDIOVERSION;  Surgeon: Chrystie Nose, MD;  Location: [facility] OR;  Service: Cardiovascular;  Laterality: N/A;    Allergies  Allergen Reactions  . Nitroglycerin Other (See Comments)    "Blood Pressure goes to zero"    Current Outpatient Prescriptions  Medication Sig Dispense Refill  . aspirin 81 MG chewable tablet Chew 81 mg by mouth daily.      Timothy [name] atorvastatin (LIPITOR) 20 MG tablet TAKE ONE TABLET BY MOUTH EVERY DAY AT  6PM.  30 tablet  6  . metoprolol tartrate (LOPRESSOR) 25 MG tablet Take 1 tablet (25 mg total) by mouth 2 (two) times daily.  60 tablet  5  . MULTAQ 400 MG tablet TAKE ONE TABLET BY MOUTH TWICE DAILY WITH MEALS  60 tablet  6  . HYDROcodone-acetaminophen (NORCO/VICODIN) 5-325 MG per tablet Take 1 tablet by mouth as needed.      Timothy [name] lisinopril (PRINIVIL,ZESTRIL) 5 MG tablet Take 1 tablet (5 mg total) by mouth daily.  30 tablet  11  . methocarbamol (ROBAXIN) 500 MG tablet Take 1 tablet by mouth daily.      Timothy [name] 20 MG TABS tablet TAKE ONE TABLET BY MOUTH ONCE DAILY  30 tablet  3   No current facility-administered medications for this visit.    Social history is notable in that he is married for 47 years. He has 3 children 10 grandchildren. He is retired from [employer] business.  Family History  Problem Relation Age of Onset  . Coronary artery disease Father   . Hypertension Father   . Diabetes Mother     ROS is negative for fever chills or night sweats. He did have a photosensitivity in the past secondary to amiodarone. He denies recent skin rash. He denies visual symptoms. He denies change in hearing. He denies recurrent sinus infection. There is no wheezing. He's unaware of recurrent  atrial fibrillation. He denies chest pain. He denies significant shortness of breath. He denies nausea vomiting or diarrhea. He denies change in diet bowel or bladder habits. There is no bleeding. He denies claudication. He denies tremors. There is no myalgias. There is no cold or heat intolerance. He denies psychological changes. He denies any awareness of sleep disorder breathing. Other comprehensive 14 point system review is negative.  PE BP 150/90  Pulse 62  Ht 5\' 6"  (1.676 m)  Wt 209 lb 3.2 oz (94.892 kg)  BMI 33.78 kg/m2 Repeat blood pressure by me was 152/88 General: Alert, oriented, no distress.  Skin: normal turgor, no rashes HEENT: Normocephalic, atraumatic. Pupils round and reactive; sclera anicteric; Fundi  Without hemorrhages or no papilledema exudates. Nose without nasal septal hypertrophy Mouth/Parynx benign; Mallinpatti scale pre- Neck: No JVD, no carotid bruits Lungs: clear to ausculatation and percussion; no wheezing or rales Heart: RRR, s1 s2 normal Short faint 1/6 systolic murmur along left sternal border. No rubs thrills or heaves.  Abdomen: soft, nontender; no hepatosplenomehaly, BS+; abdominal aorta nontender and not dilated by palpation. Pulses 2+ Extremities: no clubbinbg cyanosis or edema, Homan's sign negative  Neurologic: grossly nonfocal Psychologic: Normal mood and affect    ECG: normal sinus rhythm at 62 beats per minute; PR interval 146 ms; QTc interval 468 ms.   LABS:  BMET    Component Value Date/Time   NA 139 01/24/2013 0918   K 4.5 01/24/2013 0918   CL 104 01/24/2013 0918   CO2 24 01/24/2013 0918   GLUCOSE 98 01/24/2013 0918   BUN 12 01/24/2013 0918   CREATININE 0.92 01/24/2013 0918   CREATININE 0.83 06/14/2011 0550   CALCIUM 9.2 01/24/2013 0918   GFRNONAA >90 06/14/2011 0550   GFRAA >90 06/14/2011 0550     Hepatic Function Panel  No results found for this basename: prot, albumin, ast, alt, alkphos, bilitot, bilidir, ibili     CBC     Component Value Date/Time   WBC 8.3 06/15/2011 0530   RBC 4.55 06/15/2011 0530   HGB 14.1 06/15/2011 0530   HCT 41.6 06/15/2011 0530   PLT 234 06/15/2011 0530   MCV 91.4 06/15/2011 0530   MCH 31.0 06/15/2011 0530   MCHC 33.9 06/15/2011 0530   RDW 13.7 06/15/2011 0530     BNP No results found for this basename: probnp    Lipid Panel     Component Value Date/Time   CHOL 141 06/13/2011 0530   TRIG 151* 06/13/2011 0530   HDL 44 06/13/2011 0530   CHOLHDL 3.2 06/13/2011 0530   VLDL 30 06/13/2011 0530   LDLCALC 67 06/13/2011 0530     RADIOLOGY: No results found.   ASSESSMENT AND PLAN: From a cardiac standpoint, Mr. Safranek continues to do fairly well on his current therapy. He has established coronary  artery disease and is now 15 years status post stenting of his LAD. His last nuclear perfusion study did not demonstrate any suggestion for restenosis. He is maintaining sinus rhythm on multipack and beta blocker therapy. He is anticoagulated with 0-20 mg. He is on Lipitor for hyperlipidemia. His blood pressure is mildly elevated. I am electing to get low-dose ACE inhibition with lisinopril 5 mg. We will obtain a followup bmet in 2 weeks. I will see him in 3 months for Cardiologic reassessment.  Lennette Bihari, MD, [facility] 02/05/2013 12:58 PM

## 2013-02-06 ENCOUNTER — Encounter: Payer: Self-pay | Admitting: Cardiovascular Disease

## 2013-02-07 DIAGNOSIS — M25519 Pain in unspecified shoulder: Secondary | ICD-10-CM | POA: Diagnosis not present

## 2013-02-14 DIAGNOSIS — Z4889 Encounter for other specified surgical aftercare: Secondary | ICD-10-CM | POA: Diagnosis not present

## 2013-02-14 DIAGNOSIS — M25519 Pain in unspecified shoulder: Secondary | ICD-10-CM | POA: Diagnosis not present

## 2013-02-16 DIAGNOSIS — M25519 Pain in unspecified shoulder: Secondary | ICD-10-CM | POA: Diagnosis not present

## 2013-02-21 DIAGNOSIS — M25519 Pain in unspecified shoulder: Secondary | ICD-10-CM | POA: Diagnosis not present

## 2013-02-28 DIAGNOSIS — M25519 Pain in unspecified shoulder: Secondary | ICD-10-CM | POA: Diagnosis not present

## 2013-03-05 ENCOUNTER — Encounter: Payer: Self-pay | Admitting: *Deleted

## 2013-03-07 DIAGNOSIS — M25519 Pain in unspecified shoulder: Secondary | ICD-10-CM | POA: Diagnosis not present

## 2013-03-13 DIAGNOSIS — Z86010 Personal history of colonic polyps: Secondary | ICD-10-CM | POA: Diagnosis not present

## 2013-03-13 DIAGNOSIS — I4891 Unspecified atrial fibrillation: Secondary | ICD-10-CM | POA: Diagnosis not present

## 2013-03-13 DIAGNOSIS — I2510 Atherosclerotic heart disease of native coronary artery without angina pectoris: Secondary | ICD-10-CM | POA: Diagnosis not present

## 2013-03-14 DIAGNOSIS — M25519 Pain in unspecified shoulder: Secondary | ICD-10-CM | POA: Diagnosis not present

## 2013-04-06 ENCOUNTER — Encounter: Payer: Self-pay | Admitting: *Deleted

## 2013-04-09 IMAGING — CR DG CHEST 1V PORT
1 series · 1 of 1 positions shown · non-contrast
Comparison: None.

CLINICAL DATA: Palpitations.  Shortness of breath.  History of
atrial fibrillation.

PORTABLE CHEST - 1 VIEW

[AP]
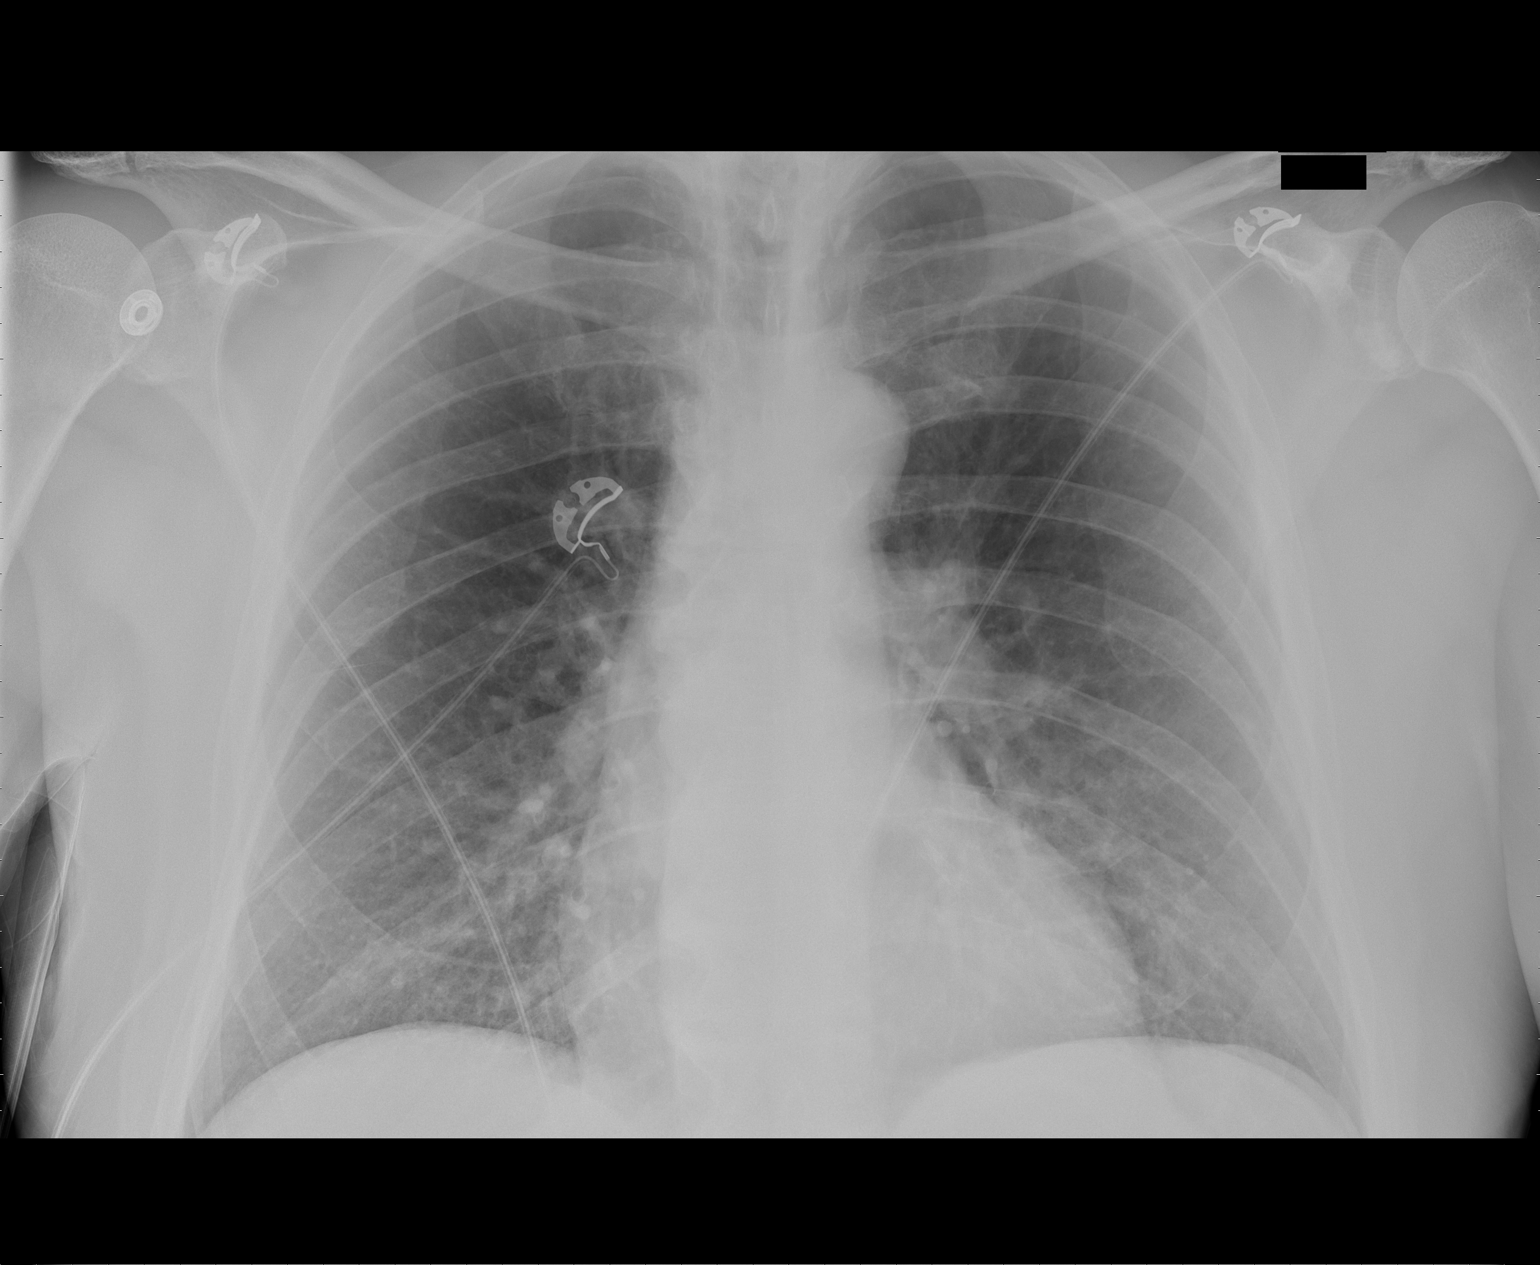

[1 of 1 positions shown; findings below may reference images not displayed]

FINDINGS: Slightly shallow inspiration.  Normal heart size and
pulmonary vascularity for technique.  No focal airspace
consolidation in the lungs.  No blunting of costophrenic angles.
No pneumothorax.  Visualized bones appear grossly intact with
degenerative changes in the thoracic spine.
IMPRESSION: No evidence of active pulmonary disease.

## 2013-04-10 ENCOUNTER — Encounter: Payer: Self-pay | Admitting: Cardiovascular Disease

## 2013-04-10 ENCOUNTER — Ambulatory Visit (INDEPENDENT_AMBULATORY_CARE_PROVIDER_SITE_OTHER): Payer: Medicare Other | Admitting: Cardiovascular Disease

## 2013-04-10 ENCOUNTER — Other Ambulatory Visit: Payer: Self-pay | Admitting: *Deleted

## 2013-04-10 VITALS — BP 160/98 | HR 54 | Ht 66.0 in | Wt 210.4 lb

## 2013-04-10 DIAGNOSIS — I10 Essential (primary) hypertension: Secondary | ICD-10-CM

## 2013-04-10 DIAGNOSIS — I480 Paroxysmal atrial fibrillation: Secondary | ICD-10-CM

## 2013-04-10 DIAGNOSIS — R5383 Other fatigue: Secondary | ICD-10-CM

## 2013-04-10 DIAGNOSIS — I4891 Unspecified atrial fibrillation: Secondary | ICD-10-CM | POA: Diagnosis not present

## 2013-04-10 DIAGNOSIS — E782 Mixed hyperlipidemia: Secondary | ICD-10-CM

## 2013-04-10 DIAGNOSIS — I2510 Atherosclerotic heart disease of native coronary artery without angina pectoris: Secondary | ICD-10-CM

## 2013-04-10 DIAGNOSIS — R5381 Other malaise: Secondary | ICD-10-CM

## 2013-04-10 DIAGNOSIS — E785 Hyperlipidemia, unspecified: Secondary | ICD-10-CM | POA: Diagnosis not present

## 2013-04-10 DIAGNOSIS — Z79899 Other long term (current) drug therapy: Secondary | ICD-10-CM

## 2013-04-10 NOTE — Patient Instructions (Addendum)
Your Cardiologist had recommend that you hold Aspirin for 5 days and Xarelto for 3 day prior to Colonoscopy   Your physician recommends that you schedule a follow-up appointment in: 6 Months   Your physician recommends that you return for lab work in: 6 Months CMP, CBC, TSH, FASTING  LIPIDS

## 2013-04-10 NOTE — Progress Notes (Signed)
Patient ID: Timothy [name], male   DOB: Aug 06, 1945, 68 y.o.   MRN: [id number]     HPI:  [name] is a 68 year old male who is a former patient of Dr. Terance Ice. He established cardiology care with me after Dr. Rollene Fare retired from his solo practice.    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 He has established coronary artery disease in 1999 underwent a self-expanding radius stent to his LAD. His last nuclear perfusion study was in May 2013 which was low risk and without ischemia an echo Doppler study showed an ejection fraction in the 55-60% range. He had mild LA dilatation. The patient has a history of sick sinus syndrome with paroxysmal atrial fibrillation and remotely had been on amiodarone but developed skin toxicity secondary to do that secondary to this. Apparently he has undergone 2 radiofrequency ablations at [facility] by Dr. Ola Spurr initially in February 2009 and he required a second procedure in December 2009 for recurrent atrial fibrillation. He had developed a recurrent episode of atrial fibrillation several years ago at which time he underwent cardioversion and has been maintaining sinus rhythm with a rate coagulation therapy.  He last saw Dr. Rollene Fare in March 2014 at which time he was felt to be stable on his current medical regimen which consisted of Lopressor 25 twice a day, multaq 400 twice a day,  Xarelto 20 mg daily in addition to his atorvastatin 20 mg and fish oil for hyperlipidemia. At that time, he did have sinusitis and URI symptomatology for which Dr. Rollene Fare did treat.  Since the patient last saw Dr. Rollene Fare, he denies any awareness of tachycardia palpitations. He denies chest pressure. Apparently he did undergo rotator cuff surgery and surgery of his bicep tendon. Since I last saw him several months ago, he states his blood pressure has improved with  the addition of lisinopril 5 mg. At home blood pressure tends to run in the 1:30 range and in the mid 80s. When I saw him initially his blood pressure was elevated at 150/90. He denies any weight loss. 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 He denies chest pressure. He is unaware of any breakthrough arrhythmia on current regimen.  He tells me that he is tentatively scheduled to undergo a colonoscopy on February 25 in [place].  Past Medical History  Diagnosis Date  . Atrial fibrillation   . Hypertension   . CAD (coronary artery disease)     LAD stent in 1999  . Hyperlipidemia   . SSS (sick sinus syndrome)   . PAF (paroxysmal atrial fibrillation)     cardioversion in 2013    Past Surgical History  Procedure Laterality Date  . Cardiac electrophysiology study and ablation  2008, 2009    RF ablation (x4) [facility] - Dr. Ola Spurr)  . Coronary angioplasty with stent placement  10/29/1997    .0x20 self-expanding radius stent to LAD (Dr. Marella Chimes)  . Cardioversion  06/15/2011    Procedure: CARDIOVERSION;  Surgeon: Pixie Casino, MD;  Location: [facility] OR;  Service: Cardiovascular;  Laterality: N/A;  . Transthoracic echocardiogram  06/13/2011    EF 55-60%, mod conc LVH; LA mildly dilated  . Nm myocar perf wall motion  07/2011    bruce myoview; partially fixed apical defect, worse at rest than stress; fixed inferobasal  diaphragmatic attenuation artifact; no reversible ischemia; post-stress EF 60%; short run of NSVT; low risk scan     Allergies  Allergen Reactions  . Nitroglycerin Other (See Comments)    "Blood Pressure goes to zero"    Current Outpatient Prescriptions  Medication Sig Dispense Refill  . aspirin 81 MG chewable tablet Chew 81 mg by mouth daily.      Marland Kitchen atorvastatin (LIPITOR) 20 MG tablet TAKE ONE TABLET BY MOUTH EVERY DAY AT  6PM.  30 tablet  6  . HYDROcodone-acetaminophen  (NORCO/VICODIN) 5-325 MG per tablet Take 1 tablet by mouth as needed.      Marland Kitchen lisinopril (PRINIVIL,ZESTRIL) 5 MG tablet Take 1 tablet (5 mg total) by mouth daily.  30 tablet  11  . methocarbamol (ROBAXIN) 500 MG tablet Take 1 tablet by mouth daily.      . metoprolol tartrate (LOPRESSOR) 25 MG tablet Take 1 tablet (25 mg total) by mouth 2 (two) times daily.  60 tablet  5  . MULTAQ 400 MG tablet TAKE ONE TABLET BY MOUTH TWICE DAILY WITH MEALS  60 tablet  6  . XARELTO 20 MG TABS tablet TAKE ONE TABLET BY MOUTH ONCE DAILY  30 tablet  3   No current facility-administered medications for this visit.    Social history is notable in that he is married for 47 years. He has 3 children 10 grandchildren. 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  Family History  Problem Relation Age of Onset  . Coronary artery disease Father     MI  . Hypertension Father   . Diabetes Mother   . Hypertension Sister   . Atrial fibrillation Child   . Hypertension Child     ROS is negative for fever chills or night sweats. He did have a photosensitivity in the past secondary to amiodarone. He denies recent skin rash. He denies visual symptoms. He denies change in hearing. He denies recurrent sinus infection. There is no wheezing. He's unaware of recurrent atrial fibrillation. He denies chest pain. He denies significant shortness of breath. He denies nausea vomiting or diarrhea. He denies change in diet bowel or bladder habits. There is no bleeding. He denies claudication. He denies tremors. There is no myalgias. There is no cold or heat intolerance. He denies psychological changes. He denies any awareness of sleep disorder breathing. Other comprehensive 14 point system review is negative.  PE BP 160/98  Pulse 54  Ht 5\' 6"  (1.676 m)  Wt 210 lb 6.4 oz (95.437 kg)  BMI 33.98 kg/m2 Repeat blood pressure by me was 122/84 General: Alert, oriented, no distress.  Skin: normal turgor, no rashes HEENT: Normocephalic,  atraumatic. Pupils round and reactive; sclera anicteric; Fundi  Without hemorrhages or no papilledema exudates. Nose without nasal septal hypertrophy Mouth/Parynx benign; Mallinpatti scale 3 Neck: No JVD, no carotid bruit; normal carotid upstroke Lungs: clear to ausculatation and percussion; no wheezing or rales Chest wall: No tenderness to palpation Heart: RRR, s1 s2 normal Short faint 1/6 systolic murmur along left sternal border. No rubs thrills or heaves.  Abdomen: soft, nontender; no hepatosplenomehaly, BS+; abdominal aorta nontender and not dilated by palpation. Pulses 2+ Extremities: no clubbinbg cyanosis or edema, Homan's sign negative  Neurologic: grossly nonfocal Psychologic: Normal mood and affect    ECG (independently read by me):  Sinus bradycardia 54 beats per minute. Normal intervals. No ectopy.  Prior ECG of 01/05/2013: normal sinus rhythm at 62 beats per minute; PR interval 146 ms; QTc interval 468  ms.   LABS:  BMET    Component Value Date/Time   NA 139 01/24/2013 0918   K 4.5 01/24/2013 0918   CL 104 01/24/2013 0918   CO2 24 01/24/2013 0918   GLUCOSE 98 01/24/2013 0918   BUN 12 01/24/2013 0918   CREATININE 0.92 01/24/2013 0918   CREATININE 0.83 06/14/2011 0550   CALCIUM 9.2 01/24/2013 0918   GFRNONAA >90 06/14/2011 0550   GFRAA >90 06/14/2011 0550     Hepatic Function Panel  No results found for this basename: prot,  albumin,  ast,  alt,  alkphos,  bilitot,  bilidir,  ibili     CBC    Component Value Date/Time   WBC 8.3 06/15/2011 0530   RBC 4.55 06/15/2011 0530   HGB 14.1 06/15/2011 0530   HCT 41.6 06/15/2011 0530   PLT 234 06/15/2011 0530   MCV 91.4 06/15/2011 0530   MCH 31.0 06/15/2011 0530   MCHC 33.9 06/15/2011 0530   RDW 13.7 06/15/2011 0530     BNP No results found for this basename: probnp    Lipid Panel     Component Value Date/Time   CHOL 141 06/13/2011 0530   TRIG 151* 06/13/2011 0530   HDL 44 06/13/2011 0530   CHOLHDL 3.2 06/13/2011 0530    VLDL 30 06/13/2011 0530   LDLCALC 67 06/13/2011 0530     RADIOLOGY: No results found.   ASSESSMENT AND PLAN: From a cardiac standpoint, Mr. Malerba continues to do fairly well on his current therapy. He has established coronary artery disease and is now 15 years status post stenting of his LAD. His last nuclear perfusion study did not demonstrate any suggestion for restenosis. He's not having any anginal symptoms or exercise-induced dyspnea. He is maintaining sinus rhythm on multaq and beta blocker therapy. He is anticoagulated with  Xarelto 20 mg. he is in need for a colonoscopy. I suggested that he hold his Xarelto for 3 days, although most likely only 2 days is absolutely necessary. He will also hold his aspirin for 5-7 days prior to his colonoscopy. He is on Lipitor for hyperlipidemia. His blood pressure is improved with the addition of lisinopril at 5 mg. However, he still has some mild blood pressure lability. I will titrate this to 7.5 mg daily. He is on atorvastatin 20 mg for his hyperlipidemia. His last cholesterol was checked by Dr. Rollene Fare in March 2014 which was 137. His triglycerides were elevated at that time at 178. I discussed with him the importance of reduction in carbohydrate intake and sweets. His HDL was normal at 41 and LDL was 60. Reviewed his most recent bmet after initiation of lisinopril and renal function has remained stable.. I will see him in 6 months for Cardiologic reassessment prior to that office visit please the laboratory will be obtained.  Troy Sine, MD, [facility] 04/10/2013 11:40 AM

## 2013-05-10 DIAGNOSIS — K5730 Diverticulosis of large intestine without perforation or abscess without bleeding: Secondary | ICD-10-CM | POA: Diagnosis not present

## 2013-05-10 DIAGNOSIS — D126 Benign neoplasm of colon, unspecified: Secondary | ICD-10-CM | POA: Diagnosis not present

## 2013-05-10 DIAGNOSIS — Z86010 Personal history of colonic polyps: Secondary | ICD-10-CM | POA: Diagnosis not present

## 2013-05-23 ENCOUNTER — Other Ambulatory Visit: Payer: Self-pay | Admitting: Cardiovascular Disease

## 2013-07-04 ENCOUNTER — Other Ambulatory Visit: Payer: Self-pay | Admitting: Cardiology

## 2013-07-05 NOTE — Telephone Encounter (Signed)
Rx was sent to pharmacy electronically. 

## 2013-07-10 ENCOUNTER — Other Ambulatory Visit: Payer: Self-pay

## 2013-07-10 MED ORDER — METOPROLOL TARTRATE 25 MG PO TABS: 25.0000 mg | ORAL_TABLET | Freq: Two times a day (BID) | ORAL | Status: DC

## 2013-07-10 NOTE — Telephone Encounter (Signed)
Rx was sent to pharmacy electronically. 

## 2013-07-20 DIAGNOSIS — I7090 Unspecified atherosclerosis: Secondary | ICD-10-CM | POA: Diagnosis not present

## 2013-07-20 DIAGNOSIS — Z79899 Other long term (current) drug therapy: Secondary | ICD-10-CM | POA: Diagnosis not present

## 2013-07-20 DIAGNOSIS — I2510 Atherosclerotic heart disease of native coronary artery without angina pectoris: Secondary | ICD-10-CM | POA: Diagnosis not present

## 2013-07-20 DIAGNOSIS — Z0000 Encounter for general adult medical examination without abnormal findings: Secondary | ICD-10-CM | POA: Diagnosis not present

## 2013-07-20 DIAGNOSIS — N400 Enlarged prostate without lower urinary tract symptoms: Secondary | ICD-10-CM | POA: Diagnosis not present

## 2013-07-20 DIAGNOSIS — Z125 Encounter for screening for malignant neoplasm of prostate: Secondary | ICD-10-CM | POA: Diagnosis not present

## 2013-07-20 DIAGNOSIS — R82998 Other abnormal findings in urine: Secondary | ICD-10-CM | POA: Diagnosis not present

## 2013-07-26 DIAGNOSIS — Z1211 Encounter for screening for malignant neoplasm of colon: Secondary | ICD-10-CM | POA: Diagnosis not present

## 2013-08-24 ENCOUNTER — Encounter: Payer: Self-pay | Admitting: *Deleted

## 2013-08-24 ENCOUNTER — Other Ambulatory Visit: Payer: Self-pay | Admitting: *Deleted

## 2013-08-24 DIAGNOSIS — R5381 Other malaise: Secondary | ICD-10-CM

## 2013-08-24 DIAGNOSIS — R5383 Other fatigue: Secondary | ICD-10-CM

## 2013-08-24 DIAGNOSIS — Z79899 Other long term (current) drug therapy: Secondary | ICD-10-CM

## 2013-08-24 DIAGNOSIS — E782 Mixed hyperlipidemia: Secondary | ICD-10-CM

## 2013-08-30 ENCOUNTER — Telehealth: Payer: Self-pay | Admitting: Cardiovascular Disease

## 2013-08-30 NOTE — Telephone Encounter (Signed)
RN returned call. Explained to patient that Dr. Claiborne Billings ordered labs to be done in 6 months at last OV in January 2015. RN explained his nurse just mailed him labs slips to get done at his convenience and that he would need to be fasting. Patient agreed with plan and voiced understanding.

## 2013-08-30 NOTE — Telephone Encounter (Signed)
Pt said he received a letter in the mail stating that he needed lab work. Pt said he thought already had his lab work,said he had it the same day he was here .

## 2013-08-31 DIAGNOSIS — E782 Mixed hyperlipidemia: Secondary | ICD-10-CM | POA: Diagnosis not present

## 2013-08-31 DIAGNOSIS — R5381 Other malaise: Secondary | ICD-10-CM | POA: Diagnosis not present

## 2013-08-31 DIAGNOSIS — Z79899 Other long term (current) drug therapy: Secondary | ICD-10-CM | POA: Diagnosis not present

## 2013-08-31 LAB — CBC
HCT: 40.3 % (ref 39.0–52.0)
Hemoglobin: 14 g/dL (ref 13.0–17.0)
MCH: 31.2 pg (ref 26.0–34.0)
MCHC: 34.7 g/dL (ref 30.0–36.0)
MCV: 89.8 fL (ref 78.0–100.0)
PLATELETS: 235 10*3/uL (ref 150–400)
RBC: 4.49 MIL/uL (ref 4.22–5.81)
RDW: 14.2 % (ref 11.5–15.5)
WBC: 6.9 10*3/uL (ref 4.0–10.5)

## 2013-08-31 LAB — TSH: TSH: 1.835 u[IU]/mL (ref 0.350–4.500)

## 2013-09-01 LAB — COMPREHENSIVE METABOLIC PANEL
ALT: 23 U/L (ref 0–53)
AST: 18 U/L (ref 0–37)
Albumin: 4.1 g/dL (ref 3.5–5.2)
Alkaline Phosphatase: 58 U/L (ref 39–117)
BILIRUBIN TOTAL: 0.8 mg/dL (ref 0.2–1.2)
BUN: 18 mg/dL (ref 6–23)
CHLORIDE: 105 meq/L (ref 96–112)
CO2: 22 mEq/L (ref 19–32)
Calcium: 8.8 mg/dL (ref 8.4–10.5)
Creat: 1.01 mg/dL (ref 0.50–1.35)
GLUCOSE: 104 mg/dL — AB (ref 70–99)
Potassium: 4.3 mEq/L (ref 3.5–5.3)
Sodium: 138 mEq/L (ref 135–145)
Total Protein: 6.7 g/dL (ref 6.0–8.3)

## 2013-09-01 LAB — LIPID PANEL
CHOL/HDL RATIO: 2.6 ratio
Cholesterol: 123 mg/dL (ref 0–200)
HDL: 48 mg/dL (ref >39)
LDL Cholesterol: 49 mg/dL (ref 0–99)
TRIGLYCERIDES: 128 mg/dL (ref <150)
VLDL: 26 mg/dL (ref 0–40)

## 2013-09-08 ENCOUNTER — Encounter: Payer: Self-pay | Admitting: *Deleted

## 2013-11-02 ENCOUNTER — Other Ambulatory Visit: Payer: Self-pay | Admitting: Cardiovascular Disease

## 2013-11-02 NOTE — Telephone Encounter (Signed)
Rx refill sent to patient pharmacy   

## 2013-11-10 ENCOUNTER — Encounter: Payer: Self-pay | Admitting: Cardiovascular Disease

## 2013-11-10 ENCOUNTER — Ambulatory Visit (INDEPENDENT_AMBULATORY_CARE_PROVIDER_SITE_OTHER): Payer: Medicare Other | Admitting: Cardiovascular Disease

## 2013-11-10 VITALS — BP 126/74 | HR 57 | Ht 66.0 in | Wt 213.1 lb

## 2013-11-10 DIAGNOSIS — E669 Obesity, unspecified: Secondary | ICD-10-CM | POA: Diagnosis not present

## 2013-11-10 DIAGNOSIS — I4891 Unspecified atrial fibrillation: Secondary | ICD-10-CM

## 2013-11-10 DIAGNOSIS — I480 Paroxysmal atrial fibrillation: Secondary | ICD-10-CM

## 2013-11-10 DIAGNOSIS — E785 Hyperlipidemia, unspecified: Secondary | ICD-10-CM | POA: Diagnosis not present

## 2013-11-10 DIAGNOSIS — I10 Essential (primary) hypertension: Secondary | ICD-10-CM | POA: Diagnosis not present

## 2013-11-10 DIAGNOSIS — I2510 Atherosclerotic heart disease of native coronary artery without angina pectoris: Secondary | ICD-10-CM | POA: Diagnosis not present

## 2013-11-10 MED ORDER — METOPROLOL TARTRATE 25 MG PO TABS: 25.0000 mg | ORAL_TABLET | Freq: Two times a day (BID) | ORAL | Status: DC

## 2013-11-10 MED ORDER — RIVAROXABAN 20 MG PO TABS: 20.0000 mg | ORAL_TABLET | Freq: Every day | ORAL | Status: DC

## 2013-11-10 MED ORDER — DRONEDARONE HCL 400 MG PO TABS: ORAL_TABLET | ORAL | Status: DC

## 2013-11-10 MED ORDER — LISINOPRIL 5 MG PO TABS: 5.0000 mg | ORAL_TABLET | Freq: Every day | ORAL | Status: DC

## 2013-11-10 MED ORDER — ATORVASTATIN CALCIUM 20 MG PO TABS: ORAL_TABLET | ORAL | Status: DC

## 2013-11-10 NOTE — Progress Notes (Signed)
Patient ID: Timothy [name], male   DOB: 1945-06-23, 68 y.o.   MRN: [id number]     HPI:  [name] is a 68 year old male who is a former patient of Dr. Terance [name]. He established cardiology care with me after Dr. Rollene [name] retired from his solo practice.  He presents for a seven-month followup Cardiologic evaluation   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 He has established coronary artery disease in 1999 underwent a self-expanding radius stent to his LAD. His last nuclear perfusion study was in May 2013 which was low risk and without ischemia an echo Doppler study showed an ejection fraction in the 55-60% range. He had mild LA dilatation. The patient has a history of sick sinus syndrome with paroxysmal atrial fibrillation and remotely had been on amiodarone but developed skin toxicity secondary to do that secondary to this. Apparently he has undergone 2 radiofrequency ablations at [facility] by Dr. Ola [name] initially in February 2009 and he required a second procedure in December 2009 for recurrent atrial fibrillation. He had developed a recurrent episode of atrial fibrillation several years ago at which time he underwent cardioversion and has been maintaining sinus rhythm with a rate coagulation therapy.  He last saw Dr. Rollene [name] in March 2014 at which time he was felt to be stable on his current medical regimen which consisted of Lopressor 25 twice a day, multaq 400 twice a day,  Xarelto 20 mg daily in addition to his atorvastatin 20 mg and fish oil for hyperlipidemia. At that time, he did have sinusitis and URI symptomatology for which Dr. Rollene [name] did treat.  Since the patient last saw Dr. Rollene [name], he denies any awareness of tachycardia palpitations. He denies chest pressure. Apparently he did undergo rotator cuff surgery and surgery  of his bicep tendon. Since I last saw him several months ago, he states his blood pressure has improved with the addition of lisinopril 5 mg. At home blood pressure tends to run in the 1:30 range and in the mid 80s. When I saw him initially his blood pressure was elevated at 150/90. He denies any weight loss. 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 He denies chest pressure. He is unaware of any breakthrough arrhythmia on current regimen.  Since I last saw him, he underwent colonoscopy.  He held his Xarelto for the procedure.  He was found of 6 polyps and will require repeat colonoscopy in 3 years.  He denies any chest pain.  He denies shortness of breath.  He has been working approximately 10-12 hours per day, but does not do any additional exercise or abdominal core strength.  He denies any weight loss.  He is unaware of breakthrough atrial fibrillation.  When I last saw him he was hypertensive and I further titrated his lisinopril.  I reviewed his laboratory that he had had 2 months ago.  Cholesterol is excellent at 123, triglycerides 128, HDL 48, LDL close to 49 on atorvastatin 20 mg.  Past Medical History  Diagnosis Date  . Atrial fibrillation   . Hypertension   . CAD (coronary artery disease)     LAD stent in 1999  . Hyperlipidemia   . SSS (sick sinus syndrome)   . PAF (paroxysmal atrial fibrillation)     cardioversion in 2013  Past Surgical History  Procedure Laterality Date  . Cardiac electrophysiology study and ablation  2008, 2009    RF ablation (x4) [facility] - Dr. Ola [name])  . Coronary angioplasty with stent placement  10/29/1997    .0x20 self-expanding radius stent to LAD (Dr. Marella [name])  . Cardioversion  06/15/2011    Procedure: CARDIOVERSION;  Surgeon: Pixie Casino, MD;  Location: [facility] OR;  Service: Cardiovascular;  Laterality: N/A;  . Transthoracic echocardiogram   06/13/2011    EF 55-60%, mod conc LVH; LA mildly dilated  . Nm myocar perf wall motion  07/2011    bruce myoview; partially fixed apical defect, worse at rest than stress; fixed inferobasal diaphragmatic attenuation artifact; no reversible ischemia; post-stress EF 60%; short run of NSVT; low risk scan     Allergies  Allergen Reactions  . Nitroglycerin Other (See Comments)    "Blood Pressure goes to zero"    Current Outpatient Prescriptions  Medication Sig Dispense Refill  . aspirin 81 MG chewable tablet Chew 81 mg by mouth daily.      Marland Kitchen atorvastatin (LIPITOR) 20 MG tablet TAKE ONE TABLET BY MOUTH ONCE DAILY AT 6 PM  30 tablet  5  . lisinopril (PRINIVIL,ZESTRIL) 5 MG tablet Take 1 tablet (5 mg total) by mouth daily.  30 tablet  11  . metoprolol tartrate (LOPRESSOR) 25 MG tablet Take 1 tablet (25 mg total) by mouth 2 (two) times daily.  60 tablet  9  . MULTAQ 400 MG tablet TAKE ONE TABLET BY MOUTH TWICE DAILY WITH MEALS  60 tablet  5  . Rivaroxaban (XARELTO) 20 MG TABS tablet Take 1 tablet (20 mg total) by mouth daily with supper.  30 tablet  6   No current facility-administered medications for this visit.    Social history is notable in that he is married for 47 years. He has 3 children 10 grandchildren. 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  Family History  Problem Relation Age of Onset  . Coronary artery disease Father     MI  . Hypertension Father   . Diabetes Mother   . Hypertension Sister   . Atrial fibrillation Child   . Hypertension Child    ROS General: Negative; No fevers, chills, or night sweats;  HEENT: Remote history of photosensitivity when he was on amiodarone; No changes in vision or hearing, sinus congestion, difficulty swallowing Pulmonary: Negative; No cough, wheezing, shortness of breath, hemoptysis Cardiovascular: See history of present illness; No chest pain, presyncope, syncope, palpitations GI: Negative; No nausea, vomiting, diarrhea, or abdominal  pain GU: Negative; No dysuria, hematuria, or difficulty voiding Musculoskeletal: Negative; no myalgias, joint pain, or weakness Hematologic/Oncology: Negative; no easy bruising, bleeding Endocrine: Negative; no heat/cold intolerance; no diabetes Neuro: Negative; no changes in balance, headaches Skin: Negative; No rashes or skin lesions Psychiatric: Negative; No behavioral problems, depression Sleep: Negative; No snoring, daytime sleepiness, hypersomnolence, bruxism, restless legs, hypnogognic hallucinations, no cataplexy Other comprehensive 14 point system review is negative.   PE BP 126/74  Pulse 57  Ht 5\' 6"  (1.676 m)  Wt 213 lb 1.6 oz (96.662 kg)  BMI 34.41 kg/m2 Repeat blood pressure by me was 122/84 General: Alert, oriented, no distress.  Skin: normal turgor, no rashes HEENT: Normocephalic, atraumatic. Pupils round and reactive; sclera anicteric; Fundi  Without hemorrhages or no papilledema exudates. Nose without nasal septal hypertrophy Mouth/Parynx benign; Mallinpatti scale 3 Neck: No JVD, no carotid bruit; normal carotid upstroke Lungs: clear to ausculatation and percussion;  no wheezing or rales Chest wall: No tenderness to palpation Heart: RRR, s1 s2 normal Short faint 1/6 systolic murmur along left sternal border. No rubs thrills or heaves.  Abdomen: soft, nontender; no hepatosplenomehaly, BS+; abdominal aorta nontender and not dilated by palpation. Pulses 2+ Extremities: no clubbinbg cyanosis or edema, Homan's sign negative  Neurologic: grossly nonfocal Psychologic: Normal mood and affect  ECG (independently read by me): Sinus bradycardia 57 beats per minute.  QTc interval 455 ms.  January 6 26 2015 ECG (independently read by me):  Sinus bradycardia 54 beats per minute. Normal intervals. No ectopy.  Prior ECG of 01/05/2013: normal sinus rhythm at 62 beats per minute; PR interval 146 ms; QTc interval 468 ms.   LABS:  BMET    Component Value Date/Time   NA 138  08/31/2013 0809   K 4.3 08/31/2013 0809   CL 105 08/31/2013 0809   CO2 22 08/31/2013 0809   GLUCOSE 104* 08/31/2013 0809   BUN 18 08/31/2013 0809   CREATININE 1.01 08/31/2013 0809   CREATININE 0.83 06/14/2011 0550   CALCIUM 8.8 08/31/2013 0809   GFRNONAA >90 06/14/2011 0550   GFRAA >90 06/14/2011 0550     Hepatic Function Panel     Component Value Date/Time   PROT 6.7 08/31/2013 0809     CBC    Component Value Date/Time   WBC 6.9 08/31/2013 0820   RBC 4.49 08/31/2013 0820   HGB 14.0 08/31/2013 0820   HCT 40.3 08/31/2013 0820   PLT 235 08/31/2013 0820   MCV 89.8 08/31/2013 0820   MCH 31.2 08/31/2013 0820   MCHC 34.7 08/31/2013 0820   RDW 14.2 08/31/2013 0820     BNP No results found for this basename: probnp    Lipid Panel     Component Value Date/Time   CHOL 123 08/31/2013 0812   TRIG 128 08/31/2013 0812   HDL 48 08/31/2013 0812   CHOLHDL 2.6 08/31/2013 0812   VLDL 26 08/31/2013 0812   LDLCALC 49 08/31/2013 0812     RADIOLOGY: No results found.   ASSESSMENT AND PLAN: Mr. Boakye continues to do well on his current therapy. He has established coronary artery disease and is now 16 years status post stenting of his LAD. His last nuclear perfusion study did not demonstrate any suggestion for restenosis. He's not having any anginal symptoms or exercise-induced dyspnea. He is maintaining sinus rhythm on multaq and beta blocker therapy. He is anticoagulated with  Xarelto 20 mg. he had held his Xarelto per my recommendations at his last office visit when he underwent his colonoscopy.  His blood pressure is now normal on his increased dose of lisinopril and with his metoprolol therapy.  His lipids are excellent.  On atorvastatin.  He's not having any bleeding on Xarelto.  I discussed with him his obesity.  Body mass index is 34.4.  He does have mild central adiposity.  We did discuss abdominal core strength exercises, and weight loss.  He'll continue with his current medical regimen.  Since he  is remaining stable, all seem in one year for followup evaluation and suggested that he see his primary care physician in 6 months.  Troy Sine, MD, [facility] 11/10/2013 9:49 AM

## 2013-11-10 NOTE — Patient Instructions (Signed)
Your physician wants you to follow-up in: 1 year You will receive a reminder letter in the mail two months in advance. If you don't receive a letter, please call our office to schedule the follow-up appointment. No changes have been made in your therapy today.

## 2013-12-13 DIAGNOSIS — H2510 Age-related nuclear cataract, unspecified eye: Secondary | ICD-10-CM | POA: Diagnosis not present

## 2013-12-17 DIAGNOSIS — J209 Acute bronchitis, unspecified: Secondary | ICD-10-CM | POA: Diagnosis not present

## 2013-12-17 DIAGNOSIS — J0100 Acute maxillary sinusitis, unspecified: Secondary | ICD-10-CM | POA: Diagnosis not present

## 2014-05-08 DIAGNOSIS — J209 Acute bronchitis, unspecified: Secondary | ICD-10-CM | POA: Diagnosis not present

## 2014-08-09 DIAGNOSIS — Z79899 Other long term (current) drug therapy: Secondary | ICD-10-CM | POA: Diagnosis not present

## 2014-08-09 DIAGNOSIS — Z23 Encounter for immunization: Secondary | ICD-10-CM | POA: Diagnosis not present

## 2014-08-09 DIAGNOSIS — W57XXXA Bitten or stung by nonvenomous insect and other nonvenomous arthropods, initial encounter: Secondary | ICD-10-CM | POA: Diagnosis not present

## 2014-08-09 DIAGNOSIS — I2510 Atherosclerotic heart disease of native coronary artery without angina pectoris: Secondary | ICD-10-CM | POA: Diagnosis not present

## 2014-08-09 DIAGNOSIS — Z1389 Encounter for screening for other disorder: Secondary | ICD-10-CM | POA: Diagnosis not present

## 2014-08-09 DIAGNOSIS — Z0000 Encounter for general adult medical examination without abnormal findings: Secondary | ICD-10-CM | POA: Diagnosis not present

## 2014-08-09 DIAGNOSIS — Z125 Encounter for screening for malignant neoplasm of prostate: Secondary | ICD-10-CM | POA: Diagnosis not present

## 2014-08-09 DIAGNOSIS — I10 Essential (primary) hypertension: Secondary | ICD-10-CM | POA: Diagnosis not present

## 2014-11-22 ENCOUNTER — Encounter: Payer: Self-pay | Admitting: Cardiovascular Disease

## 2014-11-22 ENCOUNTER — Ambulatory Visit (INDEPENDENT_AMBULATORY_CARE_PROVIDER_SITE_OTHER): Payer: Medicare Other | Admitting: Cardiovascular Disease

## 2014-11-22 ENCOUNTER — Other Ambulatory Visit: Payer: Self-pay | Admitting: Cardiovascular Disease

## 2014-11-22 VITALS — BP 152/94 | HR 59 | Ht 66.0 in | Wt 216.0 lb

## 2014-11-22 DIAGNOSIS — I4891 Unspecified atrial fibrillation: Secondary | ICD-10-CM

## 2014-11-22 DIAGNOSIS — Z79899 Other long term (current) drug therapy: Secondary | ICD-10-CM | POA: Diagnosis not present

## 2014-11-22 DIAGNOSIS — I10 Essential (primary) hypertension: Secondary | ICD-10-CM | POA: Diagnosis not present

## 2014-11-22 DIAGNOSIS — I2510 Atherosclerotic heart disease of native coronary artery without angina pectoris: Secondary | ICD-10-CM

## 2014-11-22 LAB — COMPREHENSIVE METABOLIC PANEL
ALBUMIN: 4.1 g/dL (ref 3.6–5.1)
ALT: 37 U/L (ref 9–46)
AST: 22 U/L (ref 10–35)
Alkaline Phosphatase: 58 U/L (ref 40–115)
BUN: 15 mg/dL (ref 7–25)
CHLORIDE: 105 mmol/L (ref 98–110)
CO2: 24 mmol/L (ref 20–31)
Calcium: 8.9 mg/dL (ref 8.6–10.3)
Creat: 1.01 mg/dL (ref 0.70–1.25)
Glucose, Bld: 104 mg/dL — ABNORMAL HIGH (ref 65–99)
POTASSIUM: 4.6 mmol/L (ref 3.5–5.3)
SODIUM: 139 mmol/L (ref 135–146)
Total Bilirubin: 1 mg/dL (ref 0.2–1.2)
Total Protein: 6.4 g/dL (ref 6.1–8.1)

## 2014-11-22 LAB — LIPID PANEL
CHOL/HDL RATIO: 2.9 ratio (ref <=5.0)
CHOLESTEROL: 126 mg/dL (ref 125–200)
HDL: 43 mg/dL (ref >=40)
LDL CALC: 54 mg/dL (ref <130)
Triglycerides: 144 mg/dL (ref <150)
VLDL: 29 mg/dL (ref <30)

## 2014-11-22 LAB — CBC
HCT: 42 % (ref 39.0–52.0)
Hemoglobin: 14.4 g/dL (ref 13.0–17.0)
MCH: 31.4 pg (ref 26.0–34.0)
MCHC: 34.3 g/dL (ref 30.0–36.0)
MCV: 91.5 fL (ref 78.0–100.0)
MPV: 9.5 fL (ref 8.6–12.4)
Platelets: 258 10*3/uL (ref 150–400)
RBC: 4.59 MIL/uL (ref 4.22–5.81)
RDW: 14.1 % (ref 11.5–15.5)
WBC: 7.7 10*3/uL (ref 4.0–10.5)

## 2014-11-22 LAB — TSH: TSH: 1.303 u[IU]/mL (ref 0.350–4.500)

## 2014-11-22 NOTE — Progress Notes (Signed)
Patient ID: Timothy [name], male   DOB: 1946-01-02, 69 y.o.   MRN: [id number]    HPI:  Timothy [name] is a 69 year old male who is a former patient of Dr. Terance Ice. He established cardiology care with me    one year ago.  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 He has established coronary artery disease in 1999 underwent a self-expanding radius stent to his LAD. His last nuclear perfusion study was in May 2013 which was low risk and without ischemia an echo Doppler study showed an ejection fraction in the 55-60% range. He had mild LA dilatation. The patient has a history of sick sinus syndrome with paroxysmal atrial fibrillation and remotely had been on amiodarone but developed skin toxicity secondary to do that secondary to this. Apparently he has undergone 2 radiofrequency ablations at [facility] by Dr. Ola Spurr initially in February 2009 and he required a second procedure in December 2009 for recurrent atrial fibrillation. He had developed a recurrent episode of atrial fibrillation several years ago at which time he underwent cardioversion and has been maintaining sinus rhythm with a rate coagulation therapy.  He last saw Dr. Rollene Fare in March 2014 at which time he was felt to be stable on his current medical regimen which consisted of Lopressor 25 twice a day, multaq 400 twice a day,  Xarelto 20 mg daily in addition to his atorvastatin 20 mg and fish oil for hyperlipidemia.   Last year he underwent colonoscopy.  He held his Xarelto for the procedure.  He was found of 6 polyps and will require repeat colonoscopy in 3 years.  Over the past year, he has remained fairly stable.  He specifically denies chest pain or shortness of breath.  He is unaware of any recurrent atrial fibrillation and has continued to take, multiecho 400  mg daily in addition to Xarelto, metoprolol, tartrate 25 mg twice a day.  He also has been on lisinopril 5 mg.  He states his blood pressure at home typically is in the 301 systolic range.  He has been on Lipitor for hyperlipidemia and denies myalgias.  He presents for evaluation.    Past Medical History  Diagnosis Date  . Atrial fibrillation   . Hypertension   . CAD (coronary artery disease)     LAD stent in 1999  . Hyperlipidemia   . SSS (sick sinus syndrome)   . PAF (paroxysmal atrial fibrillation)     cardioversion in 2013    Past Surgical History  Procedure Laterality Date  . Cardiac electrophysiology study and ablation  2008, 2009    RF ablation (x4) [facility] - Dr. Ola Spurr)  . Coronary angioplasty with stent placement  10/29/1997    .0x20 self-expanding radius stent to LAD (Dr. Marella Chimes)  . Cardioversion  06/15/2011    Procedure: CARDIOVERSION;  Surgeon: Pixie Casino, MD;  Location[facility] OR;  Service: Cardiovascular;  Laterality: N/A;  . Transthoracic echocardiogram  06/13/2011    EF 55-60%, mod conc LVH; LA mildly dilated  . Nm myocar perf wall motion  07/2011    bruce myoview; partially fixed apical defect, worse at rest than stress; fixed inferobasal diaphragmatic attenuation artifact; no reversible ischemia; post-stress EF 60%; short run of NSVT; low risk scan     Allergies  Allergen Reactions  . Nitroglycerin Other (See Comments)    "Blood  Pressure goes to zero"    Current Outpatient Prescriptions  Medication Sig Dispense Refill  . aspirin 81 MG chewable tablet Chew 81 mg by mouth daily.    Marland Kitchen atorvastatin (LIPITOR) 20 MG tablet TAKE ONE TABLET BY MOUTH ONCE DAILY AT 6 PM 90 tablet 3  . dronedarone (MULTAQ) 400 MG tablet TAKE ONE TABLET BY MOUTH TWICE DAILY WITH MEALS 60 tablet 11  . esomeprazole (NEXIUM) 20 MG capsule Take 20 mg by mouth daily at 12 noon.    . Garlic 5621 MG CAPS Take 1 capsule by mouth daily.    Marland Kitchen lisinopril (PRINIVIL,ZESTRIL) 5 MG  tablet Take 1 tablet (5 mg total) by mouth daily. 90 tablet 3  . metoprolol tartrate (LOPRESSOR) 25 MG tablet Take 1 tablet (25 mg total) by mouth 2 (two) times daily. 180 tablet 3  . Omega-3 Fatty Acids (OMEGA 3 PO) Take by mouth.    Alveda Reasons 20 MG TABS tablet TAKE ONE TABLET BY MOUTH ONCE DAILY WITH SUPPER 30 tablet 10   No current facility-administered medications for this visit.    Social history is notable in that he is married for 47 years. He has 3 children 10 grandchildren. 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  Family History  Problem Relation Age of Onset  . Coronary artery disease Father     MI  . Hypertension Father   . Diabetes Mother   . Hypertension Sister   . Atrial fibrillation Child   . Hypertension Child    ROS General: Negative; No fevers, chills, or night sweats;  HEENT: Remote history of photosensitivity when he was on amiodarone; No changes in vision or hearing, sinus congestion, difficulty swallowing Pulmonary: Negative; No cough, wheezing, shortness of breath, hemoptysis Cardiovascular: See history of present illness; No chest pain, presyncope, syncope, palpitations GI: Negative; No nausea, vomiting, diarrhea, or abdominal pain GU: Negative; No dysuria, hematuria, or difficulty voiding Musculoskeletal: Negative; no myalgias, joint pain, or weakness Hematologic/Oncology: Negative; no easy bruising, bleeding Endocrine: Negative; no heat/cold intolerance; no diabetes Neuro: Negative; no changes in balance, headaches Skin: Negative; No rashes or skin lesions Psychiatric: Negative; No behavioral problems, depression Sleep: Negative; No snoring, daytime sleepiness, hypersomnolence, bruxism, restless legs, hypnogognic hallucinations, no cataplexy Other comprehensive 14 point system review is negative.   PE BP 152/94 mmHg  Pulse 59  Ht _0  (1.676 m)  Wt 216 lb (97.977 kg)  BMI 34.88 kg/m2  Wt Readings from Last 3 Encounters:  11/22/14 216 lb  (97.977 kg)  11/10/13 213 lb 1.6 oz (96.662 kg)  04/10/13 210 lb 6.4 oz (95.437 kg)  Repeat blood pressure by me was 150/86 General: Alert, oriented, no distress.  Skin: normal turgor, no rashes HEENT: Normocephalic, atraumatic. Pupils round and reactive; sclera anicteric; Fundi  Without hemorrhages or no papilledema exudates. Nose without nasal septal hypertrophy Mouth/Parynx benign; Mallinpatti scale 3 Neck: No JVD, no carotid bruit; normal carotid upstroke Lungs: clear to ausculatation and percussion; no wheezing or rales Chest wall: No tenderness to palpation Heart: RRR, s1 s2 normal Short faint 1/6 systolic murmur along left sternal border. No rubs thrills or heaves.  Abdomen: soft, nontender; no hepatosplenomehaly, BS+; abdominal aorta nontender and not dilated by palpation. Pulses 2+ Extremities: no clubbinbg cyanosis or edema, Homan's sign negative  Neurologic: grossly nonfocal Psychologic: Normal mood and affect  ECG (independently read by me): Sinus rhythm at 59 bpm.  No significant ST-T changes.  QTc interval 461 ms.  August 2015 ECG (independently read by  me): Sinus bradycardia 57 beats per minute.  QTc interval 455 ms.  January  2015 ECG (independently read by me):  Sinus bradycardia 54 beats per minute. Normal intervals. No ectopy.  Prior ECG of 01/05/2013: normal sinus rhythm at 62 beats per minute; PR interval 146 ms; QTc interval 468 ms.   LABS: BMP Latest Ref Rng 08/31/2013 01/24/2013 06/14/2011  Glucose 70 - 99 mg/dL 104(H) 98 109(H)  BUN 6 - 23 mg/dL _0 Creatinine 0.50 - 1.35 mg/dL 1.01 0.92 0.83  Sodium 135 - 145 mEq/L 138 139 139  Potassium 3.5 - 5.3 mEq/L 4.3 4.5 3.7  Chloride 96 - 112 mEq/L 105 104 109  CO2 19 - 32 mEq/L _1 Calcium 8.4 - 10.5 mg/dL 8.8 9.2 8.5   Hepatic Function Latest Ref Rng 08/31/2013  Total Protein 6.0 - 8.3 g/dL 6.7  Albumin 3.5 - 5.2 g/dL 4.1  AST 0 - 37 U/L 18  ALT 0 - 53 U/L 23  Alk Phosphatase 39 - 117 U/L 58    Total Bilirubin 0.2 - 1.2 mg/dL 0.8   CBC Latest Ref Rng 08/31/2013 06/15/2011 06/14/2011  WBC 4.0 - 10.5 K/uL 6.9 8.3 8.0  Hemoglobin 13.0 - 17.0 g/dL 14.0 14.1 14.9  Hematocrit 39.0 - 52.0 % 40.3 41.6 43.0  Platelets 150 - 400 K/uL 235 234 232   Lab Results  Component Value Date   MCV 89.8 08/31/2013   MCV 91.4 06/15/2011   MCV 90.9 06/14/2011   Lab Results  Component Value Date   TSH 1.835 08/31/2013   Lipid Panel     Component Value Date/Time   CHOL 123 08/31/2013 0812   TRIG 128 08/31/2013 0812   HDL 48 08/31/2013 0812   CHOLHDL 2.6 08/31/2013 0812   VLDL 26 08/31/2013 0812   LDLCALC 49 08/31/2013 0812   RADIOLOGY: No results found.   ASSESSMENT AND PLAN: Timothy [name] is a 69 year old white male who is now 17 years status post a self-expanding Radius stent inserted into his LAD.  His last nuclear study in May 2013 was low risk without ischemia.  He is a history of sick sinus syndrome with paroxysmal atrial fibrillation and developed amiodarone skin toxicity.  He status post 2 radiofrequency ablations.  He has been maintaining sinus rhythm and continues to tolerate multiecho 40 mg twice a day without recurrence.  He states his blood pressure at home typically is in the 622 systolical range.  His blood pressure today was mildly elevated.  He remains asymptomatic with reference angina symptoms.  He denies any CHF symptoms.  He does have GERD and had developed continued issues on Prilosec but this has improved with change to Nexium.  He continues to be on Xarelto for anticoagulation without bleeding.  He has not had recent laboratory and I will obtain fasting lab work today and contact him regarding results.  He does have moderate obesity with a BMI 34.9.  Weight loss was recommended as was increased activity.  As long as he remains stable, I will see him in one year for reevaluation.   Time spent: 25 minutes  Troy Sine, MD, [facility] 11/22/2014 6:00 PM

## 2014-11-22 NOTE — Patient Instructions (Signed)
Your physician recommends that you return for lab work fasting.  Your physician wants you to follow-up in: 1 year or sooner if needed with Dr. Kelly. You will receive a reminder letter in the mail two months in advance. If you don't receive a letter, please call our office to schedule the follow-up appointment. 

## 2014-11-22 NOTE — Telephone Encounter (Signed)
Rx request sent to pharmacy.  

## 2014-12-05 ENCOUNTER — Other Ambulatory Visit: Payer: Self-pay | Admitting: Cardiovascular Disease

## 2014-12-05 NOTE — Telephone Encounter (Signed)
Rx(s) sent to pharmacy electronically.  

## 2014-12-13 ENCOUNTER — Encounter: Payer: Self-pay | Admitting: *Deleted

## 2014-12-16 ENCOUNTER — Other Ambulatory Visit: Payer: Self-pay | Admitting: Cardiovascular Disease

## 2014-12-17 NOTE — Telephone Encounter (Signed)
Rx(s) sent to pharmacy electronically.  

## 2015-01-28 ENCOUNTER — Other Ambulatory Visit: Payer: Self-pay | Admitting: Cardiovascular Disease

## 2015-01-28 MED ORDER — ATORVASTATIN CALCIUM 20 MG PO TABS: ORAL_TABLET | ORAL | Status: DC

## 2015-01-28 NOTE — Telephone Encounter (Signed)
Pt's medication was sent to pt's pharmacy requested. Confirmation received.  

## 2015-01-28 NOTE — Telephone Encounter (Signed)
°*  STAT* If patient is at the pharmacy, call can be transferred to refill team.   1. Which medications need to be refilled? (please list name of each medication and dose if known) Atorvastatin   2. Which pharmacy/location (including street and city if local pharmacy) is medication to be sent to? [facility] in [place]  3. Do they need a 30 day or 90 day supply? [place]

## 2015-02-03 DIAGNOSIS — J0100 Acute maxillary sinusitis, unspecified: Secondary | ICD-10-CM | POA: Diagnosis not present

## 2015-02-05 DIAGNOSIS — R509 Fever, unspecified: Secondary | ICD-10-CM | POA: Diagnosis not present

## 2015-08-26 ENCOUNTER — Telehealth: Payer: Self-pay | Admitting: Cardiovascular Disease

## 2015-08-26 NOTE — Telephone Encounter (Signed)
New Message  Pt called to schedule a f/u, none avail. I did offer pt a PA appt and he refused, only wanting to see Dr Claiborne Billings. Pt requested a call from RN to discuss. Please call back to discuss.

## 2015-08-26 NOTE — Telephone Encounter (Signed)
No answer. Left message to call back.   

## 2015-08-26 NOTE — Telephone Encounter (Signed)
Returned call to pt. Reinforced that Dr Evette Georges schedule was not out for September to October yet and to call back the first part of July to schedule appt. He verbalized understanding and no further questions.

## 2015-10-16 ENCOUNTER — Other Ambulatory Visit: Payer: Self-pay | Admitting: Cardiovascular Disease

## 2015-11-08 ENCOUNTER — Other Ambulatory Visit: Payer: Self-pay

## 2015-11-27 DIAGNOSIS — Z125 Encounter for screening for malignant neoplasm of prostate: Secondary | ICD-10-CM | POA: Diagnosis not present

## 2015-11-27 DIAGNOSIS — Z0000 Encounter for general adult medical examination without abnormal findings: Secondary | ICD-10-CM | POA: Diagnosis not present

## 2015-11-27 DIAGNOSIS — Z23 Encounter for immunization: Secondary | ICD-10-CM | POA: Diagnosis not present

## 2015-11-27 DIAGNOSIS — Z79899 Other long term (current) drug therapy: Secondary | ICD-10-CM | POA: Diagnosis not present

## 2015-11-27 DIAGNOSIS — I10 Essential (primary) hypertension: Secondary | ICD-10-CM | POA: Diagnosis not present

## 2015-11-27 DIAGNOSIS — I2510 Atherosclerotic heart disease of native coronary artery without angina pectoris: Secondary | ICD-10-CM | POA: Diagnosis not present

## 2015-11-29 ENCOUNTER — Other Ambulatory Visit: Payer: Self-pay | Admitting: Cardiovascular Disease

## 2015-12-02 NOTE — Telephone Encounter (Signed)
Rx(s) sent to pharmacy electronically.  

## 2015-12-05 ENCOUNTER — Other Ambulatory Visit: Payer: Self-pay | Admitting: Cardiovascular Disease

## 2015-12-05 NOTE — Telephone Encounter (Signed)
Rx(s) sent to pharmacy electronically.  

## 2015-12-10 ENCOUNTER — Other Ambulatory Visit: Payer: Self-pay | Admitting: Cardiovascular Disease

## 2015-12-11 NOTE — Telephone Encounter (Signed)
Rx(s) sent to pharmacy electronically.  

## 2016-01-02 ENCOUNTER — Other Ambulatory Visit: Payer: Self-pay | Admitting: Cardiovascular Disease

## 2016-01-02 NOTE — Telephone Encounter (Signed)
REFILL 

## 2016-01-10 ENCOUNTER — Ambulatory Visit (INDEPENDENT_AMBULATORY_CARE_PROVIDER_SITE_OTHER): Payer: Medicare Other | Admitting: Cardiovascular Disease

## 2016-01-10 ENCOUNTER — Encounter: Payer: Self-pay | Admitting: Cardiovascular Disease

## 2016-01-10 VITALS — BP 117/72 | HR 55 | Ht 66.0 in | Wt 186.0 lb

## 2016-01-10 DIAGNOSIS — Z7901 Long term (current) use of anticoagulants: Secondary | ICD-10-CM | POA: Diagnosis not present

## 2016-01-10 DIAGNOSIS — I4891 Unspecified atrial fibrillation: Secondary | ICD-10-CM

## 2016-01-10 DIAGNOSIS — I2510 Atherosclerotic heart disease of native coronary artery without angina pectoris: Secondary | ICD-10-CM

## 2016-01-10 DIAGNOSIS — I10 Essential (primary) hypertension: Secondary | ICD-10-CM | POA: Diagnosis not present

## 2016-01-10 DIAGNOSIS — E785 Hyperlipidemia, unspecified: Secondary | ICD-10-CM

## 2016-01-10 NOTE — Progress Notes (Signed)
Patient ID: Timothy [name], male   DOB: [birth year], 70 y.o.   MRN: [id number]    HPI:  Timothy [name] is a 70 year old male who is a former patient of Dr. Terance Ice. 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  He has established CAD and in 1999 underwent a self-expanding radius stent to his LAD. His last nuclear perfusion study was in May 2013 which was low risk and without ischemia an echo Doppler study showed an ejection fraction in the 55-60% range. He had mild LA dilatation. The patient has a history of sick sinus syndrome with paroxysmal atrial fibrillation and remotely had been on amiodarone but developed skin toxicity secondary to do that secondary to this. Apparently he has undergone 2 radiofrequency ablations at [facility] by Dr. Ola Spurr initially in February 2009 and he required a second procedure in December 2009 for recurrent atrial fibrillation. He had developed a recurrent episode of atrial fibrillation several years ago at which time he underwent cardioversion and has been maintaining sinus rhythm with a rate coagulation therapy.  He last saw Dr. Rollene Fare in March 2014 at which time he was felt to be stable on his current medical regimen which consisted of Lopressor 25 twice a day, multaq 400 twice a day,  Xarelto 20 mg daily in addition to his atorvastatin 20 mg and fish oil for hyperlipidemia.   In 2015 he underwent colonoscopy.  He held his Xarelto for the procedure.  He was found of 6 polyps and will require repeat colonoscopy in 3 years.  When I saw him last year he denied any episodes of recurrent atrial fibrillation.  Over the past year, he states he had one episode of atrial fibrillation which occurred 2 weeks ago.  This occurred after he had  Worked to significant  exhaustion and was straining, lifting sheet rock by himself and remodeling a bathroom.  That night he noticed that his heart rate increased and he was back in atrial fibrillation.  He has continued to be on Xarelto long-term.  He believes he atrial fibrillation lasted for 2 days and ultimately resolved on its own and has not had any recurrence.  He believes this was the first recurrence in almost 4 years..  He specifically denies chest pain or shortness of breath.  He continues to take multaq 400 mg daily in addition to Xarelto, metoprolol tartrate 25 mg twice a day and  lisinopril 5 mg.  He states his blood pressure at home typically is in the 295 systolic range.  He presents for one-year evaluation.  Past Medical History:  Diagnosis Date  . Atrial fibrillation ([place])   . CAD (coronary artery disease)    LAD stent in 1999  . Hyperlipidemia   . Hypertension   . PAF (paroxysmal atrial fibrillation) ([place])    cardioversion in 2013  . SSS (sick sinus syndrome) [facility])     Past Surgical History:  Procedure Laterality Date  . CARDIAC ELECTROPHYSIOLOGY STUDY AND ABLATION  2008, 2009   RF ablation (x4) [facility] - Dr. Ola Spurr)  . CARDIOVERSION  06/15/2011   Procedure: CARDIOVERSION;  Surgeon: Pixie Casino, MD;  Location: [place];  Service: Cardiovascular;  Laterality: N/A;  . CORONARY ANGIOPLASTY WITH STENT PLACEMENT  10/29/1997   .0x20 self-expanding radius stent to LAD (Dr. Marella Chimes)  .  NM MYOCAR PERF WALL MOTION  07/2011   bruce myoview; partially fixed apical defect, worse at rest than stress; fixed inferobasal diaphragmatic attenuation artifact; no reversible ischemia; post-stress EF 60%; short run of NSVT; low risk scan   . TRANSTHORACIC ECHOCARDIOGRAM  06/13/2011   EF 55-60%, mod conc LVH; LA mildly dilated    Allergies  Allergen Reactions  . Nitroglycerin Other (See Comments)    "Blood Pressure goes to zero"    Current Outpatient Prescriptions  Medication Sig Dispense  Refill  . aspirin 81 MG chewable tablet Chew 81 mg by mouth daily.    Marland Kitchen atorvastatin (LIPITOR) 20 MG tablet TAKE ONE TABLET BY MOUTH ONCE DAILY AT 6 PM 90 tablet 3  . dronedarone (MULTAQ) 400 MG tablet Take 1 tablet (400 mg total) by mouth 2 (two) times daily with a meal. KEEP OV. 180 tablet 0  . esomeprazole (NEXIUM) 20 MG capsule Take 20 mg by mouth daily at 12 noon.    . Garlic 1027 MG CAPS Take 1 capsule by mouth daily.    Marland Kitchen lisinopril (PRINIVIL,ZESTRIL) 5 MG tablet TAKE ONE TABLET BY MOUTH ONCE DAILY 90 tablet 3  . metoprolol tartrate (LOPRESSOR) 25 MG tablet TAKE ONE TABLET BY MOUTH TWICE DAILY 180 tablet 3  . Omega-3 Fatty Acids (OMEGA 3 PO) Take by mouth.    . rivaroxaban (XARELTO) 20 MG TABS tablet Take 1 tablet (20 mg total) by mouth daily with supper. 30 tablet 5   No current facility-administered medications for this visit.     Social history is notable in that he is married for 47 years. He has 3 children 10 grandchildren. 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  Family History  Problem Relation Age of Onset  . Coronary artery disease Father     MI  . Hypertension Father   . Diabetes Mother   . Hypertension Sister   . Atrial fibrillation Child   . Hypertension Child    ROS General: Negative; No fevers, chills, or night sweats;  HEENT: Remote history of photosensitivity when he was on amiodarone; No changes in vision or hearing, sinus congestion, difficulty swallowing Pulmonary: Negative; No cough, wheezing, shortness of breath, hemoptysis Cardiovascular: See history of present illness; No chest pain, presyncope, syncope, palpitations GI: Negative; No nausea, vomiting, diarrhea, or abdominal pain GU: Negative; No dysuria, hematuria, or difficulty voiding Musculoskeletal: Negative; no myalgias, joint pain, or weakness Hematologic/Oncology: Negative; no easy bruising, bleeding Endocrine: Negative; no heat/cold intolerance; no diabetes Neuro: Negative; no changes in  balance, headaches Skin: Negative; No rashes or skin lesions Psychiatric: Negative; No behavioral problems, depression Sleep: Negative; No snoring, daytime sleepiness, hypersomnolence, bruxism, restless legs, hypnogognic hallucinations, no cataplexy Other comprehensive 14 point system review is negative.   PE BP 117/72 (BP Location: Right Arm, Patient Position: Sitting, Cuff Size: Normal)   Pulse (!) 55   Ht '5\' 6"'$  (1.676 m)   Wt 186 lb (84.4 kg)   SpO2 99%   BMI 30.02 kg/m   Wt Readings from Last 3 Encounters:  01/10/16 186 lb (84.4 kg)  11/22/14 216 lb (98 kg)  11/10/13 213 lb 1.6 oz (96.7 kg)  Repeat blood pressure by me was 150/86 General: Alert, oriented, no distress.  Skin: normal turgor, no rashes HEENT: Normocephalic, atraumatic. Pupils round and reactive; sclera anicteric; Fundi  Without hemorrhages or no papilledema exudates. Nose without nasal septal hypertrophy Mouth/Parynx benign; Mallinpatti scale 3 Neck: No JVD, no carotid bruit; normal carotid upstroke Lungs: clear to ausculatation  and percussion; no wheezing or rales Chest wall: No tenderness to palpation Heart: RRR, s1 s2 normal Short faint 1/6 systolic murmur along left sternal border. No rubs thrills or heaves.  Abdomen: soft, nontender; no hepatosplenomehaly, BS+; abdominal aorta nontender and not dilated by palpation. Pulses 2+ Extremities: no clubbinbg cyanosis or edema, Homan's sign negative  Neurologic: grossly nonfocal Psychologic: Normal mood and affect  ECG (independently read by me): Sinus rhythm at 59 bpm.  No significant ST-T changes.  QTc interval 461 ms.  August 2015 ECG (independently read by me): Sinus bradycardia 57 beats per minute.  QTc interval 455 ms.  January  2015 ECG (independently read by me):  Sinus bradycardia 54 beats per minute. Normal intervals. No ectopy.  Prior ECG of 01/05/2013: normal sinus rhythm at 62 beats per minute; PR interval 146 ms; QTc interval 468  ms.   LABS: BMP Latest Ref Rng & Units 11/22/2014 08/31/2013 01/24/2013  Glucose 65 - 99 mg/dL 104(H) 104(H) 98  BUN 7 - 25 mg/dL '15 18 12  '$ Creatinine 0.70 - 1.25 mg/dL 1.01 1.01 0.92  Sodium 135 - 146 mmol/L 139 138 139  Potassium 3.5 - 5.3 mmol/L 4.6 4.3 4.5  Chloride 98 - 110 mmol/L 105 105 104  CO2 20 - 31 mmol/L '24 22 24  '$ Calcium 8.6 - 10.3 mg/dL 8.9 8.8 9.2   Hepatic Function Latest Ref Rng & Units 11/22/2014 08/31/2013  Total Protein 6.1 - 8.1 g/dL 6.4 6.7  Albumin 3.6 - 5.1 g/dL 4.1 4.1  AST 10 - 35 U/L 22 18  ALT 9 - 46 U/L 37 23  Alk Phosphatase 40 - 115 U/L 58 58  Total Bilirubin 0.2 - 1.2 mg/dL 1.0 0.8   CBC Latest Ref Rng & Units 11/22/2014 08/31/2013 06/15/2011  WBC 4.0 - 10.5 K/uL 7.7 6.9 8.3  Hemoglobin 13.0 - 17.0 g/dL 14.4 14.0 14.1  Hematocrit 39.0 - 52.0 % 42.0 40.3 41.6  Platelets 150 - 400 K/uL 258 235 234   Lab Results  Component Value Date   MCV 91.5 11/22/2014   MCV 89.8 08/31/2013   MCV 91.4 06/15/2011   Lab Results  Component Value Date   TSH 1.303 11/22/2014   Lipid Panel     Component Value Date/Time   CHOL 126 11/22/2014 0856   TRIG 144 11/22/2014 0856   HDL 43 11/22/2014 0856   CHOLHDL 2.9 11/22/2014 0856   VLDL 29 11/22/2014 0856   LDLCALC 54 11/22/2014 0856   RADIOLOGY: No results found.   ASSESSMENT AND PLAN: Timothy [name] is a 70 year old white male who is now 18 years status post a self-expanding Radius stent inserted into his LAD.  His last nuclear study in May 2013 was low risk without ischemia.  He is a history of sick sinus syndrome with paroxysmal atrial fibrillation and developed amiodarone skin toxicity.  He is status post 2 radiofrequency ablations.  Approximate 2 weeks ago, he experiences first episode of recurrent atrial fibrillation in over 4 years.  This occurred after an extremely strenuous day while he was straining and lifting heavy sheet rock by himself.  The atrial fibrillation episode lasted approximately 48 hours.  It  ultimately resolved on its own.  He is on multaq 400 mg daily in addition to metoprolol 25 mg twice a day.  I have suggested that if he were to experience another episode that he can take an extra metoprolol at the initiation of atrial fibrillation to see if this can help refer him back  to sinus rhythm.  He is on Xarelto for anticoagulation and is tolerating this well without bleeding.  His blood pressure today is stable.  He has lost over 30 pounds over the past year.  This was purposeful.  His BMI is improved from over 34 to 30.0 , and he plans to lose an additional  5-10 more pounds.  He denies any difficulty with sleep.  He feels his sleep is restorative.  He denies nocturnal palpitations.  He has been on Lipitor for hyperlipidemia and denies myalgias. He continues to take Nexium 20 mg for GERD.  He has hyperlipidemia and is on atorvastatin 20 mg daily.  With his concomitant CAD he continues to take 81 mg aspirin.  He had a complete set of blood work done by his primary physician, Dr. Ann Held in [place].  I will try to obtain these results.  As long as he remains stable, I will see him in one year for reevaluation.   Time spent: 25 minutes  Troy Sine, MD, [facility] 01/10/2016 5:48 PM

## 2016-01-10 NOTE — Patient Instructions (Addendum)
Your physician wants you to follow-up in: 1 YEAR OR SOONER IF NEEDED. You will receive a reminder letter in the mail two months in advance. If you don't receive a letter, please call our office to schedule the follow-up appointment.   If you need a refill on your cardiac medications before your next appointment, please call your pharmacy. 

## 2016-01-22 ENCOUNTER — Other Ambulatory Visit: Payer: Self-pay | Admitting: Cardiovascular Disease

## 2016-03-04 ENCOUNTER — Telehealth: Payer: Self-pay | Admitting: Cardiovascular Disease

## 2016-03-04 NOTE — Telephone Encounter (Signed)
Pt said he dropped a form off  from his insurance company concerning staying on [place]. This have been since the last of October or first of November and still have not heard anything.

## 2016-03-04 NOTE — Telephone Encounter (Signed)
Message routed to [place] regarding form for [employer]

## 2016-03-17 NOTE — Telephone Encounter (Signed)
Patient left a msg on the refill vm requesting a call back at [phone]. He is inquiring about paperwork for multaq. Thanks, MI

## 2016-03-18 NOTE — Telephone Encounter (Signed)
Left patient a voice mail that Dr Claiborne Billings will address when he returns to the office on Monday 03/23/16.

## 2016-04-02 ENCOUNTER — Other Ambulatory Visit: Payer: Self-pay | Admitting: Cardiovascular Disease

## 2016-04-03 ENCOUNTER — Telehealth: Payer: Self-pay | Admitting: *Deleted

## 2016-04-03 NOTE — Telephone Encounter (Signed)
Called [facility] to get PA for Multaq 400 mg. Representative took information and said that she will send it to their clinical department. REFERENCE# [id number]. States that it will take 24-48 hours for approval.

## 2016-04-03 NOTE — Telephone Encounter (Signed)
Rx(s) sent to pharmacy electronically.  

## 2016-04-11 DIAGNOSIS — J069 Acute upper respiratory infection, unspecified: Secondary | ICD-10-CM | POA: Diagnosis not present

## 2016-04-14 ENCOUNTER — Other Ambulatory Visit: Payer: Self-pay | Admitting: Cardiovascular Disease

## 2016-04-15 NOTE — Telephone Encounter (Signed)
Called [facility] to obtained copy of lab work.  Records department will fax recent labs to [phone].  Will re-assess tomorrow morning

## 2016-04-15 NOTE — Telephone Encounter (Signed)
Awaiting Lab work from PCP [facility]. Last BMET & CBC > 71 year old

## 2016-04-16 ENCOUNTER — Encounter: Payer: Self-pay | Admitting: *Deleted

## 2016-04-16 ENCOUNTER — Telehealth: Payer: Self-pay | Admitting: *Deleted

## 2016-04-16 ENCOUNTER — Telehealth: Payer: Self-pay | Admitting: Pharmacist

## 2016-04-16 NOTE — Telephone Encounter (Signed)
Patient brought a paper by the office from his insurance   This encounter was created in error - please disregard.

## 2016-04-16 NOTE — Telephone Encounter (Signed)
PA paperwork and last office note faxed to the number provided regarding multaq

## 2016-04-16 NOTE — Telephone Encounter (Signed)
Complete blood work done by PCP on Sept/13/17 (70yo, 84.4kg, 5'6'')  H/H = 14.9/43.8 PLT = 261 ALT = 23 AST = 18 SCR = 1.05  Est CrCl = 60 ml/min by Cockcroft-Gault equation

## 2016-04-29 DIAGNOSIS — H2513 Age-related nuclear cataract, bilateral: Secondary | ICD-10-CM | POA: Diagnosis not present

## 2016-05-20 ENCOUNTER — Telehealth: Payer: Self-pay | Admitting: *Deleted

## 2016-05-20 NOTE — Telephone Encounter (Signed)
Faxed approval to hold xarelto for colonoscopy on 06/05/16.

## 2016-06-05 DIAGNOSIS — K635 Polyp of colon: Secondary | ICD-10-CM | POA: Diagnosis not present

## 2016-06-05 DIAGNOSIS — D120 Benign neoplasm of cecum: Secondary | ICD-10-CM | POA: Diagnosis not present

## 2016-06-05 DIAGNOSIS — Z86010 Personal history of colonic polyps: Secondary | ICD-10-CM | POA: Diagnosis not present

## 2016-06-05 DIAGNOSIS — Z1211 Encounter for screening for malignant neoplasm of colon: Secondary | ICD-10-CM | POA: Diagnosis not present

## 2016-06-05 DIAGNOSIS — D123 Benign neoplasm of transverse colon: Secondary | ICD-10-CM | POA: Diagnosis not present

## 2016-06-05 DIAGNOSIS — K5730 Diverticulosis of large intestine without perforation or abscess without bleeding: Secondary | ICD-10-CM | POA: Diagnosis not present

## 2016-06-17 ENCOUNTER — Other Ambulatory Visit: Payer: Self-pay | Admitting: Cardiovascular Disease

## 2016-10-20 ENCOUNTER — Other Ambulatory Visit: Payer: Self-pay | Admitting: Cardiology

## 2016-11-27 DIAGNOSIS — Z0000 Encounter for general adult medical examination without abnormal findings: Secondary | ICD-10-CM | POA: Diagnosis not present

## 2016-11-27 DIAGNOSIS — I10 Essential (primary) hypertension: Secondary | ICD-10-CM | POA: Diagnosis not present

## 2016-11-27 DIAGNOSIS — Z125 Encounter for screening for malignant neoplasm of prostate: Secondary | ICD-10-CM | POA: Diagnosis not present

## 2016-11-27 DIAGNOSIS — Z79899 Other long term (current) drug therapy: Secondary | ICD-10-CM | POA: Diagnosis not present

## 2016-11-27 DIAGNOSIS — N400 Enlarged prostate without lower urinary tract symptoms: Secondary | ICD-10-CM | POA: Diagnosis not present

## 2016-11-27 DIAGNOSIS — I2510 Atherosclerotic heart disease of native coronary artery without angina pectoris: Secondary | ICD-10-CM | POA: Diagnosis not present

## 2016-12-04 DIAGNOSIS — Z79899 Other long term (current) drug therapy: Secondary | ICD-10-CM | POA: Diagnosis not present

## 2016-12-04 DIAGNOSIS — D171 Benign lipomatous neoplasm of skin and subcutaneous tissue of trunk: Secondary | ICD-10-CM | POA: Diagnosis not present

## 2016-12-04 DIAGNOSIS — D1724 Benign lipomatous neoplasm of skin and subcutaneous tissue of left leg: Secondary | ICD-10-CM | POA: Diagnosis not present

## 2016-12-07 ENCOUNTER — Other Ambulatory Visit: Payer: Self-pay | Admitting: Cardiovascular Disease

## 2016-12-10 ENCOUNTER — Other Ambulatory Visit: Payer: Self-pay | Admitting: Cardiovascular Disease

## 2016-12-11 NOTE — Telephone Encounter (Signed)
REFILL 

## 2017-01-14 ENCOUNTER — Other Ambulatory Visit: Payer: Self-pay | Admitting: Cardiovascular Disease

## 2017-01-27 ENCOUNTER — Encounter: Payer: Self-pay | Admitting: Cardiovascular Disease

## 2017-01-27 ENCOUNTER — Ambulatory Visit (INDEPENDENT_AMBULATORY_CARE_PROVIDER_SITE_OTHER): Payer: Medicare Other | Admitting: Cardiovascular Disease

## 2017-01-27 VITALS — BP 150/78 | HR 52 | Ht 66.0 in | Wt 206.6 lb

## 2017-01-27 DIAGNOSIS — Z7901 Long term (current) use of anticoagulants: Secondary | ICD-10-CM | POA: Diagnosis not present

## 2017-01-27 DIAGNOSIS — I480 Paroxysmal atrial fibrillation: Secondary | ICD-10-CM

## 2017-01-27 DIAGNOSIS — I2510 Atherosclerotic heart disease of native coronary artery without angina pectoris: Secondary | ICD-10-CM | POA: Diagnosis not present

## 2017-01-27 DIAGNOSIS — I10 Essential (primary) hypertension: Secondary | ICD-10-CM | POA: Diagnosis not present

## 2017-01-27 DIAGNOSIS — Z79899 Other long term (current) drug therapy: Secondary | ICD-10-CM

## 2017-01-27 DIAGNOSIS — E669 Obesity, unspecified: Secondary | ICD-10-CM

## 2017-01-27 DIAGNOSIS — E785 Hyperlipidemia, unspecified: Secondary | ICD-10-CM | POA: Diagnosis not present

## 2017-01-27 DIAGNOSIS — E66811 Obesity, class 1: Secondary | ICD-10-CM

## 2017-01-27 MED ORDER — ATORVASTATIN CALCIUM 40 MG PO TABS: 40.0000 mg | ORAL_TABLET | Freq: Every day | ORAL | 3 refills | Status: DC

## 2017-01-27 MED ORDER — RIVAROXABAN 20 MG PO TABS: 20.0000 mg | ORAL_TABLET | Freq: Every day | ORAL | 3 refills | Status: DC

## 2017-01-27 NOTE — Progress Notes (Signed)
Patient ID: ALIX STOWERS, male   DOB: Sep 02, 1945, 71 y.o.   MRN: [id number]    HPI:  Mr. Chancelor Hardrick is a 71 year old male who is a former patient of Dr. Terance Ice. 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  He has established CAD and in 1999 underwent a self-expanding radius stent to his LAD. His last nuclear perfusion study was in May 2013 which was low risk and without ischemia an echo Doppler study showed an ejection fraction in the 55-60% range. He had mild LA dilatation. The patient has a history of sick sinus syndrome with paroxysmal atrial fibrillation and remotely had been on amiodarone but developed skin toxicity secondary to do that secondary to this. Apparently he has undergone 2 radiofrequency ablations at [facility] by Dr. Ola Spurr initially in February 2009 and he required a second procedure in December 2009 for recurrent atrial fibrillation. He had developed a recurrent episode of atrial fibrillation several years ago at which time he underwent cardioversion and has been maintaining sinus rhythm with a rate coagulation therapy.  He last saw Dr. Rollene Fare in March 2014 at which time he was felt to be stable on his current medical regimen which consisted of Lopressor 25 twice a day, multaq 400 twice a day,  Xarelto 20 mg daily in addition to his atorvastatin 20 mg and fish oil for hyperlipidemia.   In 2015 he underwent colonoscopy.  He held his Xarelto for the procedure.  He was found of 6 polyps and will require repeat colonoscopy in 3 years.  When I last saw him 13 months ago, he had experienced one episode of atrial fibrillation several weeks prior.  This occurred after working to significant exhaustion when he was straining, lifting sheet rock and remodeling a bathroom.      Over the past year, he denies any awareness of recurrent palpitations or atrial fibrillation.  He is now retired from [employer] and has a part-time job as a Clinical biochemist.  However, he states he often is working at least 50 hours per week in this "part-time "position.  Over the past year, he has been on atorvastatin 20 mg for hyperlipidemia, lisinopril 5 mg and metoprolol, tartrate 25 Mill grams twice a day for hypertension in his PAF in addition to Xarelto 20 mg.  He denies any bleeding.  Since.  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  He admits to weight gain and is gained 20 pounds over the past year.  He has not been exercising regularly.  He had undergone laboratory by Dr. Ann Held.  His primary physician on 11/27/2016.  Of note, total cholesterol was 142, HDL 44, LDL 79, triglycerides 94.  Renal function was stable with a creatinine of 0.97.  LFTs were normal.  TSH was 1.3.  He presents for one-year follow-up evaluation.  Past Medical History:  Diagnosis Date  . Atrial fibrillation ([place])   . CAD (coronary artery disease)    LAD stent in 1999  . Hyperlipidemia   . Hypertension   . PAF (paroxysmal atrial fibrillation) ([place])    cardioversion in 2013  . SSS (sick sinus syndrome) [facility])     Past Surgical History:  Procedure Laterality Date  . CARDIAC ELECTROPHYSIOLOGY STUDY AND ABLATION  2008, 2009   RF ablation (x4) [facility] - Dr. Ola Spurr)  . CORONARY  ANGIOPLASTY WITH STENT PLACEMENT  10/29/1997   .0x20 self-expanding radius stent to LAD (Dr. Marella Chimes)  . NM MYOCAR PERF WALL MOTION  07/2011   bruce myoview; partially fixed apical defect, worse at rest than stress; fixed inferobasal diaphragmatic attenuation artifact; no reversible ischemia; post-stress EF 60%; short run of NSVT; low risk scan   . TRANSTHORACIC ECHOCARDIOGRAM  06/13/2011   EF 55-60%, mod conc LVH; LA mildly dilated    Allergies  Allergen Reactions  . Nitroglycerin Other (See Comments)     "Blood Pressure goes to zero"    Current Outpatient Medications  Medication Sig Dispense Refill  . aspirin 81 MG chewable tablet Chew 81 mg by mouth daily.    Marland Kitchen atorvastatin (LIPITOR) 40 MG tablet Take 1 tablet (40 mg total) daily at 6 PM by mouth. 90 tablet 3  . esomeprazole (NEXIUM) 20 MG capsule Take 20 mg by mouth daily at 12 noon.    . Garlic 7035 MG CAPS Take 1 capsule by mouth daily.    Marland Kitchen lisinopril (PRINIVIL,ZESTRIL) 5 MG tablet TAKE ONE TABLET BY MOUTH ONCE DAILY 90 tablet 0  . metoprolol tartrate (LOPRESSOR) 25 MG tablet Take 1 tablet (25 mg total) by mouth 2 (two) times daily. NEED OV. 180 tablet 0  . Omega-3 Fatty Acids (OMEGA 3 PO) Take by mouth.    . rivaroxaban (XARELTO) 20 MG TABS tablet Take 1 tablet (20 mg total) daily with supper by mouth. 90 tablet 3   No current facility-administered medications for this visit.     Social history is notable in that he is married for 48 years. He has 3 children 10 grandchildren. 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  Family History  Problem Relation Age of Onset  . Coronary artery disease Father        MI  . Hypertension Father   . Diabetes Mother   . Hypertension Sister   . Atrial fibrillation Child   . Hypertension Child    ROS General: Negative; No fevers, chills, or night sweats; positive for weight gain HEENT: Remote history of photosensitivity when he was on amiodarone; No changes in vision or hearing, sinus congestion, difficulty swallowing Pulmonary: Negative; No cough, wheezing, shortness of breath, hemoptysis Cardiovascular: See history of present illness; No chest pain, presyncope, syncope, palpitations GI: Negative; No nausea, vomiting, diarrhea, or abdominal pain GU: Negative; No dysuria, hematuria, or difficulty voiding Musculoskeletal: Negative; no myalgias, joint pain, or weakness Hematologic/Oncology: Negative; no easy bruising, bleeding Endocrine: Negative; no heat/cold intolerance; no  diabetes Neuro: Negative; no changes in balance, headaches Skin: Negative; No rashes or skin lesions Psychiatric: Negative; No behavioral problems, depression Sleep: Negative; No snoring, daytime sleepiness, hypersomnolence, bruxism, restless legs, hypnogognic hallucinations, no cataplexy Other comprehensive 14 point system review is negative.   PE BP (!) 150/78   Pulse (!) 52   Ht '5\' 6"'$  (1.676 m)   Wt 206 lb 9.6 oz (93.7 kg)   BMI 33.35 kg/m    Repeat blood pressure by me 134/78  Wt Readings from Last 3 Encounters:  01/27/17 206 lb 9.6 oz (93.7 kg)  01/10/16 186 lb (84.4 kg)  11/22/14 216 lb (98 kg)   General: Alert, oriented, no distress.  Skin: normal turgor, no rashes, warm and dry HEENT: Normocephalic, atraumatic. Pupils equal round and reactive to light; sclera anicteric; extraocular muscles intact; Nose without nasal septal hypertrophy Mouth/Parynx benign; Mallinpatti scale 3 Neck: No JVD, no carotid bruits; normal carotid upstroke Lungs: clear to ausculatation and  percussion; no wheezing or rales Chest wall: without tenderness to palpitation Heart: PMI not displaced, RRR, s1 s2 normal, 1/6 systolic murmur, no diastolic murmur, no rubs, gallops, thrills, or heaves Abdomen: soft, nontender; no hepatosplenomehaly, BS+; abdominal aorta nontender and not dilated by palpation. Back: no CVA tenderness Pulses 2+ Musculoskeletal: full range of motion, normal strength, no joint deformities Extremities: no clubbing cyanosis or edema, Homan's sign negative  Neurologic: grossly nonfocal; Cranial nerves grossly wnl Psychologic: Normal mood and affect   ECG (independently read by me): Sinus bradycardia 52 bpm.  Normal intervals.  No ST segment changes.  October 2017 ECG (independently read by me): Sinus rhythm at 59 bpm.  No significant ST-T changes.  QTc interval 461 ms.  August 2015 ECG (independently read by me): Sinus bradycardia 57 beats per minute.  QTc interval 455  ms.  January  2015 ECG (independently read by me):  Sinus bradycardia 54 beats per minute. Normal intervals. No ectopy.  Prior ECG of 01/05/2013: normal sinus rhythm at 62 beats per minute; PR interval 146 ms; QTc interval 468 ms.   LABS: BMP Latest Ref Rng & Units 11/22/2014 08/31/2013 01/24/2013  Glucose 65 - 99 mg/dL 104(H) 104(H) 98  BUN 7 - 25 mg/dL '15 18 12  '$ Creatinine 0.70 - 1.25 mg/dL 1.01 1.01 0.92  Sodium 135 - 146 mmol/L 139 138 139  Potassium 3.5 - 5.3 mmol/L 4.6 4.3 4.5  Chloride 98 - 110 mmol/L 105 105 104  CO2 20 - 31 mmol/L '24 22 24  '$ Calcium 8.6 - 10.3 mg/dL 8.9 8.8 9.2   Hepatic Function Latest Ref Rng & Units 11/22/2014 08/31/2013  Total Protein 6.1 - 8.1 g/dL 6.4 6.7  Albumin 3.6 - 5.1 g/dL 4.1 4.1  AST 10 - 35 U/L 22 18  ALT 9 - 46 U/L 37 23  Alk Phosphatase 40 - 115 U/L 58 58  Total Bilirubin 0.2 - 1.2 mg/dL 1.0 0.8   CBC Latest Ref Rng & Units 11/22/2014 08/31/2013 06/15/2011  WBC 4.0 - 10.5 K/uL 7.7 6.9 8.3  Hemoglobin 13.0 - 17.0 g/dL 14.4 14.0 14.1  Hematocrit 39.0 - 52.0 % 42.0 40.3 41.6  Platelets 150 - 400 K/uL 258 235 234   Lab Results  Component Value Date   MCV 91.5 11/22/2014   MCV 89.8 08/31/2013   MCV 91.4 06/15/2011   Lab Results  Component Value Date   TSH 1.303 11/22/2014   Lipid Panel     Component Value Date/Time   CHOL 126 11/22/2014 0856   TRIG 144 11/22/2014 0856   HDL 43 11/22/2014 0856   CHOLHDL 2.9 11/22/2014 0856   VLDL 29 11/22/2014 0856   LDLCALC 54 11/22/2014 0856   RADIOLOGY: No results found.  IMPRESSION: 1. Coronary artery disease involving native coronary artery of native heart without angina pectoris   2. Hyperlipidemia with target LDL less than 70   3. Essential hypertension   4. Medication management   5. Anticoagulation adequate   6. PAF (paroxysmal atrial fibrillation) (HCC)   7. Obesity (BMI 30.0-34.9)    ASSESSMENT AND PLAN: Mr. Charnley is a 71 year old white male who has established CAD and underwent  insertion of a self-expanding Radius stent to his LAD in 1999.  His last nuclear study in May 2013 was low risk without ischemia.  He is a history of sick sinus syndrome with paroxysmal atrial fibrillation and developed amiodarone skin toxicity.  He is status post 2 radiofrequency ablations.  When I last saw him 48  months ago, he had experienced one episode of recurrent atrial fibrillation during.  Of significant strenuous activity.  This ultimately resolved.  Remotely he had been on multiecho, but is no longer taking this.  Over the past year, he has just been on metoprolol, tartrate 25 mg twice a day and has not had any recurrent awareness of heart rate irregularity.  He continues to be on anticoagulation with Xarelto and denies bleeding.  His blood pressure today is stable, although I discussed with him optimal blood pressure is now considered less than 0:10 systolically.  He has a history of GERD, which is controlled with omeprazole.  I reviewed the blood work from his primary physician.  With his history of established CAD.  I have recommended further aggressiveness of lipid lowering therapy with an ink to get target LDL less than 70 and preferably into the 50s or below.  I will increase atorvastatin to 40 mg daily.  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  He is gained 20 pounds.  BMI is now 33.35.  We discussed resumption of the neck exercise program such that he exercise at least 5 days per week for 30 minutes if at all possible.  His wife will also be retiring from work in December and perhaps they can both be more active.  His ECG remained stable with sinus bradycardia.  In 3 months, he will undergo repeat lipid panel, liver function studies on his increased atorvastatin.  As long as he remains stable, I will see him in one year for reevaluation.   Time spent: 25 minutes   Troy Sine, MD, [facility] 01/27/2017 10:49 AM

## 2017-01-27 NOTE — Patient Instructions (Signed)
Medication Instructions:  INCREASE atorvastatin (Lipitor) to 40 mg daily  Labwork: Please return for FASTING labs in 3 months (CMET, Lipid)-lab orders provided.  Follow-Up: Your physician wants you to follow-up in: 12 months with Dr. Claiborne Billings. You will receive a reminder letter in the mail two months in advance. If you don't receive a letter, please call our office to schedule the follow-up appointment.   Any Other Special Instructions Will Be Listed Below (If Applicable).     If you need a refill on your cardiac medications before your next appointment, please call your pharmacy.

## 2017-03-10 ENCOUNTER — Other Ambulatory Visit: Payer: Self-pay | Admitting: Cardiovascular Disease

## 2017-03-11 ENCOUNTER — Other Ambulatory Visit: Payer: Self-pay | Admitting: Cardiovascular Disease

## 2017-03-12 DIAGNOSIS — B9689 Other specified bacterial agents as the cause of diseases classified elsewhere: Secondary | ICD-10-CM | POA: Diagnosis not present

## 2017-03-12 DIAGNOSIS — J208 Acute bronchitis due to other specified organisms: Secondary | ICD-10-CM | POA: Diagnosis not present

## 2017-03-12 DIAGNOSIS — Z6832 Body mass index (BMI) 32.0-32.9, adult: Secondary | ICD-10-CM | POA: Diagnosis not present

## 2017-03-26 DIAGNOSIS — Z1331 Encounter for screening for depression: Secondary | ICD-10-CM | POA: Diagnosis not present

## 2017-03-26 DIAGNOSIS — Z6834 Body mass index (BMI) 34.0-34.9, adult: Secondary | ICD-10-CM | POA: Diagnosis not present

## 2017-03-26 DIAGNOSIS — Z9181 History of falling: Secondary | ICD-10-CM | POA: Diagnosis not present

## 2017-03-26 DIAGNOSIS — R05 Cough: Secondary | ICD-10-CM | POA: Diagnosis not present

## 2017-04-30 DIAGNOSIS — Z79899 Other long term (current) drug therapy: Secondary | ICD-10-CM | POA: Diagnosis not present

## 2017-04-30 DIAGNOSIS — E785 Hyperlipidemia, unspecified: Secondary | ICD-10-CM | POA: Diagnosis not present

## 2017-04-30 LAB — COMPREHENSIVE METABOLIC PANEL
ALBUMIN: 4.3 g/dL (ref 3.6–4.8)
ALK PHOS: 58 IU/L (ref 39–117)
ALT: 32 IU/L (ref 0–44)
AST: 23 IU/L (ref 0–40)
Albumin/Globulin Ratio: 2 (ref 1.2–2.2)
BUN / CREAT RATIO: 19 (ref 10–24)
BUN: 15 mg/dL (ref 8–27)
Bilirubin Total: 0.8 mg/dL (ref 0.0–1.2)
CHLORIDE: 105 mmol/L (ref 96–106)
CO2: 22 mmol/L (ref 20–29)
Calcium: 9 mg/dL (ref 8.6–10.2)
Creatinine, Ser: 0.79 mg/dL (ref 0.76–1.27)
GFR calc Af Amer: 106 mL/min/{1.73_m2} (ref >59)
GFR calc non Af Amer: 92 mL/min/{1.73_m2} (ref >59)
GLUCOSE: 106 mg/dL — AB (ref 65–99)
Globulin, Total: 2.2 g/dL (ref 1.5–4.5)
Potassium: 4.3 mmol/L (ref 3.5–5.2)
SODIUM: 138 mmol/L (ref 134–144)
Total Protein: 6.5 g/dL (ref 6.0–8.5)

## 2017-04-30 LAB — LIPID PANEL
CHOLESTEROL TOTAL: 125 mg/dL (ref 100–199)
Chol/HDL Ratio: 2.6 ratio (ref 0.0–5.0)
HDL: 48 mg/dL (ref >39)
LDL Calculated: 57 mg/dL (ref 0–99)
Triglycerides: 98 mg/dL (ref 0–149)
VLDL CHOLESTEROL CAL: 20 mg/dL (ref 5–40)

## 2017-05-12 ENCOUNTER — Encounter: Payer: Self-pay | Admitting: *Deleted

## 2017-06-28 DIAGNOSIS — J329 Chronic sinusitis, unspecified: Secondary | ICD-10-CM | POA: Diagnosis not present

## 2017-06-28 DIAGNOSIS — Z6833 Body mass index (BMI) 33.0-33.9, adult: Secondary | ICD-10-CM | POA: Diagnosis not present

## 2017-06-28 DIAGNOSIS — J40 Bronchitis, not specified as acute or chronic: Secondary | ICD-10-CM | POA: Diagnosis not present

## 2017-08-30 DIAGNOSIS — M545 Low back pain: Secondary | ICD-10-CM | POA: Diagnosis not present

## 2017-08-30 DIAGNOSIS — S7001XA Contusion of right hip, initial encounter: Secondary | ICD-10-CM | POA: Diagnosis not present

## 2017-08-30 DIAGNOSIS — Z6834 Body mass index (BMI) 34.0-34.9, adult: Secondary | ICD-10-CM | POA: Diagnosis not present

## 2017-08-30 DIAGNOSIS — S7011XA Contusion of right thigh, initial encounter: Secondary | ICD-10-CM | POA: Diagnosis not present

## 2017-12-03 DIAGNOSIS — Z79899 Other long term (current) drug therapy: Secondary | ICD-10-CM | POA: Diagnosis not present

## 2017-12-03 DIAGNOSIS — I480 Paroxysmal atrial fibrillation: Secondary | ICD-10-CM | POA: Diagnosis not present

## 2017-12-03 DIAGNOSIS — Z125 Encounter for screening for malignant neoplasm of prostate: Secondary | ICD-10-CM | POA: Diagnosis not present

## 2017-12-03 DIAGNOSIS — I2510 Atherosclerotic heart disease of native coronary artery without angina pectoris: Secondary | ICD-10-CM | POA: Diagnosis not present

## 2017-12-03 DIAGNOSIS — S61219A Laceration without foreign body of unspecified finger without damage to nail, initial encounter: Secondary | ICD-10-CM | POA: Diagnosis not present

## 2017-12-03 DIAGNOSIS — Z23 Encounter for immunization: Secondary | ICD-10-CM | POA: Diagnosis not present

## 2017-12-03 DIAGNOSIS — I10 Essential (primary) hypertension: Secondary | ICD-10-CM | POA: Diagnosis not present

## 2017-12-03 DIAGNOSIS — Z0000 Encounter for general adult medical examination without abnormal findings: Secondary | ICD-10-CM | POA: Diagnosis not present

## 2017-12-09 ENCOUNTER — Other Ambulatory Visit: Payer: Self-pay | Admitting: Cardiovascular Disease

## 2017-12-24 DIAGNOSIS — I10 Essential (primary) hypertension: Secondary | ICD-10-CM | POA: Diagnosis not present

## 2017-12-24 DIAGNOSIS — Z6835 Body mass index (BMI) 35.0-35.9, adult: Secondary | ICD-10-CM | POA: Diagnosis not present

## 2018-01-26 ENCOUNTER — Other Ambulatory Visit: Payer: Self-pay | Admitting: Cardiovascular Disease

## 2018-01-30 ENCOUNTER — Other Ambulatory Visit: Payer: Self-pay | Admitting: Cardiovascular Disease

## 2018-03-11 ENCOUNTER — Other Ambulatory Visit: Payer: Self-pay | Admitting: Cardiovascular Disease

## 2018-03-18 DIAGNOSIS — J329 Chronic sinusitis, unspecified: Secondary | ICD-10-CM | POA: Diagnosis not present

## 2018-03-18 DIAGNOSIS — Z6835 Body mass index (BMI) 35.0-35.9, adult: Secondary | ICD-10-CM | POA: Diagnosis not present

## 2018-03-18 DIAGNOSIS — J40 Bronchitis, not specified as acute or chronic: Secondary | ICD-10-CM | POA: Diagnosis not present

## 2018-03-25 ENCOUNTER — Encounter (INDEPENDENT_AMBULATORY_CARE_PROVIDER_SITE_OTHER): Payer: Self-pay

## 2018-03-25 ENCOUNTER — Encounter: Payer: Self-pay | Admitting: Cardiovascular Disease

## 2018-03-25 ENCOUNTER — Ambulatory Visit (INDEPENDENT_AMBULATORY_CARE_PROVIDER_SITE_OTHER): Payer: Medicare Other | Admitting: Cardiovascular Disease

## 2018-03-25 VITALS — BP 116/82 | HR 55 | Ht 66.0 in | Wt 213.0 lb

## 2018-03-25 DIAGNOSIS — E669 Obesity, unspecified: Secondary | ICD-10-CM | POA: Diagnosis not present

## 2018-03-25 DIAGNOSIS — I2510 Atherosclerotic heart disease of native coronary artery without angina pectoris: Secondary | ICD-10-CM

## 2018-03-25 DIAGNOSIS — I10 Essential (primary) hypertension: Secondary | ICD-10-CM

## 2018-03-25 DIAGNOSIS — K219 Gastro-esophageal reflux disease without esophagitis: Secondary | ICD-10-CM | POA: Diagnosis not present

## 2018-03-25 DIAGNOSIS — Z7901 Long term (current) use of anticoagulants: Secondary | ICD-10-CM | POA: Diagnosis not present

## 2018-03-25 DIAGNOSIS — I480 Paroxysmal atrial fibrillation: Secondary | ICD-10-CM | POA: Diagnosis not present

## 2018-03-25 NOTE — Progress Notes (Signed)
Patient ID: Timothy [name], male   DOB: 1946/03/11, 73 y.o.   MRN: [id number]    HPI:  Timothy [name] is a 73 year old male who is a former patient of Dr. Terance Ice. 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  He has established CAD and in 1999 underwent a self-expanding radius stent to his LAD. His last nuclear perfusion study was in May 2013 which was low risk and without ischemia an echo Doppler study showed an ejection fraction in the 55-60% range. He had mild LA dilatation. The patient has a history of sick sinus syndrome with paroxysmal atrial fibrillation and remotely had been on amiodarone but developed skin toxicity secondary to do that secondary to this. Apparently he has undergone 2 radiofrequency ablations at [facility] by Dr. Ola Spurr initially in February 2009 and he required a second procedure in December 2009 for recurrent atrial fibrillation. He had developed a recurrent episode of atrial fibrillation several years ago at which time he underwent cardioversion and has been maintaining sinus rhythm with a rate coagulation therapy.  He last saw Dr. Rollene Fare in March 2014 at which time he was felt to be stable on his current medical regimen which consisted of Lopressor 25 twice a day, multaq 400 twice a day,  Xarelto 20 mg daily in addition to his atorvastatin 20 mg and fish oil for hyperlipidemia.   In 2015 he underwent colonoscopy.  He held his Xarelto for the procedure.  He was found of 6 polyps and will require repeat colonoscopy in 3 years.  When I saw him in 2017 he had experienced one episode of atrial fibrillation several weeks prior.  This occurred after working to significant exhaustion when he was straining, lifting sheet rock and remodeling a bathroom.    I last  saw him in November 2018 at which time he denied  any awareness of recurrent palpitations or atrial fibrillation.  He is retired from [employer] and has a part-time job as a Clinical biochemist.  However, he states he often is working at least 50 hours per week in this "part-time "position.  At that time he was on atorvastatin for hyperlipidemia, lisinopril 5 mg and metoprolol tartrate 25 mg twice a day for hypertension and PAF in addition to Xarelto anticoagulation.  Laboratory by Dr. Ann Held.  on 11/27/2016:  total cholesterol was 142, HDL 44, LDL 79, triglycerides 94.  Renal function was stable with a creatinine of 0.97.  LFTs were normal.  TSH was 1.3.    Over the past year, he has remained stable.  However approximately 2 weeks ago he began to notice some URI type symptoms.  2 Sundays ago while getting ready to do a church service he felt his heart become irregular and he believes he was in atrial fibrillation for 28 hours duration with a rate averaging around 110 bpm.  He did not take any extra metoprolol.  Ultimately he reverted back to sinus rhythm.  He later saw his primary physician and was diagnosed with bronchitis and received antibiotic therapy in addition to Spiriva and cough medication.  He denies any recurrent atrial fibrillation since.  He denies chest pressure.  He continues to be on Xarelto 20 mg for hyperlipidemia.  His lisinopril dose has been increased to 10 mg for more optimal blood pressure  control.  He presents for reevaluation.  Past Medical History:  Diagnosis Date  . Atrial fibrillation ([place])   . CAD (coronary artery disease)    LAD stent in 1999  . Hyperlipidemia   . Hypertension   . PAF (paroxysmal atrial fibrillation) ([place])    cardioversion in 2013  . SSS (sick sinus syndrome) [facility])     Past Surgical History:  Procedure Laterality Date  . CARDIAC ELECTROPHYSIOLOGY STUDY AND ABLATION  2008, 2009   RF ablation (x4) [facility] - Dr. Ola Spurr)  .  CARDIOVERSION  06/15/2011   Procedure: CARDIOVERSION;  Surgeon: Pixie Casino, MD;  Location: [place];  Service: Cardiovascular;  Laterality: N/A;  . CORONARY ANGIOPLASTY WITH STENT PLACEMENT  10/29/1997   .0x20 self-expanding radius stent to LAD (Dr. Marella Chimes)  . NM MYOCAR PERF WALL MOTION  07/2011   bruce myoview; partially fixed apical defect, worse at rest than stress; fixed inferobasal diaphragmatic attenuation artifact; no reversible ischemia; post-stress EF 60%; short run of NSVT; low risk scan   . TRANSTHORACIC ECHOCARDIOGRAM  06/13/2011   EF 55-60%, mod conc LVH; LA mildly dilated    Allergies  Allergen Reactions  . Nitroglycerin Other (See Comments)    "Blood Pressure goes to zero"    Current Outpatient Medications  Medication Sig Dispense Refill  . aspirin 81 MG chewable tablet Chew 81 mg by mouth daily.    Marland Kitchen atorvastatin (LIPITOR) 40 MG tablet TAKE 1 TABLET BY MOUTH ONCE DAILY AT 6  PM 90 tablet 1  . esomeprazole (NEXIUM) 20 MG capsule Take 20 mg by mouth daily at 12 noon.    . Garlic 9381 MG CAPS Take 1 capsule by mouth daily.    Marland Kitchen lisinopril (PRINIVIL,ZESTRIL) 10 MG tablet Take 10 mg by mouth daily.    . metoprolol tartrate (LOPRESSOR) 25 MG tablet Take 1 tablet (25 mg total) by mouth 2 (two) times daily. (BETA BLOCKER) 180 tablet 0  . Omega-3 Fatty Acids (OMEGA 3 PO) Take by mouth.    Alveda Reasons 20 MG TABS tablet TAKE 1 TABLET BY MOUTH ONCE DAILY WITH SUPPER 90 tablet 0   No current facility-administered medications for this visit.     Social history is notable in that he is married for 48 years. He has 3 children 10 grandchildren. 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  Family History  Problem Relation Age of Onset  . Coronary artery disease Father        MI  . Hypertension Father   . Diabetes Mother   . Hypertension Sister   . Atrial fibrillation Child   . Hypertension Child    ROS General: Negative; No fevers, chills, or night sweats; positive for  weight gain HEENT: Remote history of photosensitivity when he was on amiodarone; No changes in vision or hearing, sinus congestion, difficulty swallowing Pulmonary: Recent bronchitis with mild wheezing Cardiovascular: See history of present illness; No chest pain, presyncope, syncope, palpitations GI: Negative; No nausea, vomiting, diarrhea, or abdominal pain GU: Negative; No dysuria, hematuria, or difficulty voiding Musculoskeletal: Negative; no myalgias, joint pain, or weakness Hematologic/Oncology: Negative; no easy bruising, bleeding Endocrine: Negative; no heat/cold intolerance; no diabetes Neuro: Negative; no changes in balance, headaches Skin: Negative; No rashes or skin lesions Psychiatric: Negative; No behavioral problems, depression Sleep: Negative; No snoring, daytime sleepiness, hypersomnolence, bruxism, restless legs, hypnogognic hallucinations, no cataplexy Other comprehensive 14 point system review is negative.   PE BP 116/82 (BP Location: Left Arm, Patient Position: Sitting,  Cuff Size: Normal)   Pulse (!) 55   Ht '5\' 6"'$  (1.676 m)   Wt 213 lb (96.6 kg)   BMI 34.38 kg/m    Repeat blood pressure by me 118/78  Wt Readings from Last 3 Encounters:  03/25/18 213 lb (96.6 kg)  01/27/17 206 lb 9.6 oz (93.7 kg)  01/10/16 186 lb (84.4 kg)   General: Alert, oriented, no distress.  Skin: normal turgor, no rashes, warm and dry HEENT: Normocephalic, atraumatic. Pupils equal round and reactive to light; sclera anicteric; extraocular muscles intact;  Nose without nasal septal hypertrophy Mouth/Parynx benign; Mallinpatti scale 3 Neck: No JVD, no carotid bruits; normal carotid upstroke Lungs: clear to ausculatation and percussion; no wheezing or rales Chest wall: without tenderness to palpitation Heart: PMI not displaced, RRR, s1 s2 normal, 1/6 systolic murmur, no diastolic murmur, no rubs, gallops, thrills, or heaves Abdomen: soft, nontender; no hepatosplenomehaly, BS+; abdominal  aorta nontender and not dilated by palpation. Back: no CVA tenderness Pulses 2+ Musculoskeletal: full range of motion, normal strength, no joint deformities Extremities: no clubbing cyanosis or edema, Homan's sign negative  Neurologic: grossly nonfocal; Cranial nerves grossly wnl Psychologic: Normal mood and affect  ECG (independently read by me): Sinus bradycardia 55 bpm.  Normal intervals.  No ectopy.  November 2018 ECG (independently read by me): Sinus bradycardia 52 bpm.  Normal intervals.  No ST segment changes.  October 2017 ECG (independently read by me): Sinus rhythm at 59 bpm.  No significant ST-T changes.  QTc interval 461 ms.  August 2015 ECG (independently read by me): Sinus bradycardia 57 beats per minute.  QTc interval 455 ms.  January  2015 ECG (independently read by me):  Sinus bradycardia 54 beats per minute. Normal intervals. No ectopy.  Prior ECG of 01/05/2013: normal sinus rhythm at 62 beats per minute; PR interval 146 ms; QTc interval 468 ms.   LABS: BMP Latest Ref Rng & Units 04/30/2017 11/22/2014 08/31/2013  Glucose 65 - 99 mg/dL 106(H) 104(H) 104(H)  BUN 8 - 27 mg/dL '15 15 18  '$ Creatinine 0.76 - 1.27 mg/dL 0.79 1.01 1.01  BUN/Creat Ratio 10 - 24 19 - -  Sodium 134 - 144 mmol/L 138 139 138  Potassium 3.5 - 5.2 mmol/L 4.3 4.6 4.3  Chloride 96 - 106 mmol/L 105 105 105  CO2 20 - 29 mmol/L '22 24 22  '$ Calcium 8.6 - 10.2 mg/dL 9.0 8.9 8.8   Hepatic Function Latest Ref Rng & Units 04/30/2017 11/22/2014 08/31/2013  Total Protein 6.0 - 8.5 g/dL 6.5 6.4 6.7  Albumin 3.6 - 4.8 g/dL 4.3 4.1 4.1  AST 0 - 40 IU/L '23 22 18  '$ ALT 0 - 44 IU/L 32 37 23  Alk Phosphatase 39 - 117 IU/L 58 58 58  Total Bilirubin 0.0 - 1.2 mg/dL 0.8 1.0 0.8   CBC Latest Ref Rng & Units 11/22/2014 08/31/2013 06/15/2011  WBC 4.0 - 10.5 K/uL 7.7 6.9 8.3  Hemoglobin 13.0 - 17.0 g/dL 14.4 14.0 14.1  Hematocrit 39.0 - 52.0 % 42.0 40.3 41.6  Platelets 150 - 400 K/uL 258 235 234   Lab Results  Component Value  Date   MCV 91.5 11/22/2014   MCV 89.8 08/31/2013   MCV 91.4 06/15/2011   Lab Results  Component Value Date   TSH 1.303 11/22/2014   Lipid Panel     Component Value Date/Time   CHOL 125 04/30/2017 1046   TRIG 98 04/30/2017 1046   HDL 48 04/30/2017 1046   CHOLHDL  2.6 04/30/2017 1046   CHOLHDL 2.9 11/22/2014 0856   VLDL 29 11/22/2014 0856   LDLCALC 57 04/30/2017 1046   RADIOLOGY: No results found.  IMPRESSION: 1. PAF (paroxysmal atrial fibrillation) ([place])   2. Anticoagulation adequate   3. Coronary artery disease involving native coronary artery of native heart without angina pectoris: Radiau stent 1999 to LAD   4. Essential hypertension   5. Obesity (BMI 30.0-34.9)   6. Gastroesophageal reflux disease without esophagitis    ASSESSMENT AND PLAN: Timothy [name] is a 41--year-old white male who has established CAD and underwent insertion of a self-expanding Radius stent to his LAD in 1999.  His last nuclear study in May 2013 was low risk without ischemia.  He is a history of sick sinus syndrome with paroxysmal atrial fibrillation and developed amiodarone skin toxicity.  He is status post 2 radiofrequency ablations.  When I last saw him 13 months ago, he had experienced one episode of recurrent atrial fibrillation during.  He has been without any recurrent episodes of atrial fibrillation for greater than 2 years but 2 weeks ago experienced an episode of AF which he believes lasted from 11 AM on Sunday until 3 PM on Monday with an average ventricular rate at 110 bpm.  This ultimately broke spontaneously and he has been in sinus rhythm since.  His ECG today shows sinus rhythm.  He has been on metoprolol tartrate 25 mg twice a day and I have suggested that if he experiences a recurrent breakthrough that he should take an extra metoprolol dose.  We discussed alternative treatment also with diltiazem.  He is on Xarelto.  His blood pressure today is stable and he is now on an increased lisinopril  dose to 10 mg compared to 1 year previously.  He continues to be on atorvastatin for hyperlipidemia.  In September 2019 total cholesterol was 139, LDL cholesterol 73 HDL 38 and triglycerides 139.  He also is on over-the-counter omega-3 fatty acid therapy.  He denies any bleeding.  He was previously followed by Dr. Nicki Reaper who retired and is now established with Dr. Humphrey Rolls as his new primary care physician.  We discussed continued exercise and weight loss.  BMI is 34.38.  His last echo Doppler study was in 2013.  With his recent AF I have recommended a follow-up echo Doppler assessment.  He has GERD and takes Nexium.  I will see him in 6 months for reevaluation or sooner as needed.  Time spent: 25 minutes   Troy Sine, MD, [facility] 03/26/2018 3:16 PM

## 2018-03-25 NOTE — Patient Instructions (Signed)
Medication Instructions:  The current medical regimen is effective;  continue present plan and medications. May take extra Metoprolol dose if needed as discussed.  If you need a refill on your cardiac medications before your next appointment, please call your pharmacy.    Testing/Procedures: Echocardiogram - Your physician has requested that you have an echocardiogram. Echocardiography is a painless test that uses sound waves to create images of your heart. It provides your doctor with information about the size and shape of your heart and how well your heart's chambers and valves are working. This procedure takes approximately one hour. There are no restrictions for this procedure. This will be performed at our [facility] location - [address].   Follow-Up: At [facility], you and your health needs are our priority.  As part of our continuing mission to provide you with exceptional heart care, we have created designated Provider Care Teams.  These Care Teams include your primary Cardiologist (physician) and Advanced Practice Providers (APPs -  Physician Assistants and Nurse Practitioners) who all work together to provide you with the care you need, when you need it. . Follow up with Dr.Kelly in 6 months.

## 2018-03-26 ENCOUNTER — Encounter: Payer: Self-pay | Admitting: Cardiovascular Disease

## 2018-03-31 ENCOUNTER — Ambulatory Visit (HOSPITAL_COMMUNITY): Payer: Medicare Other

## 2018-04-04 ENCOUNTER — Other Ambulatory Visit: Payer: Self-pay

## 2018-04-04 ENCOUNTER — Ambulatory Visit (HOSPITAL_COMMUNITY): Payer: Medicare Other | Attending: Cardiology

## 2018-04-04 DIAGNOSIS — I480 Paroxysmal atrial fibrillation: Secondary | ICD-10-CM | POA: Diagnosis not present

## 2018-04-26 ENCOUNTER — Other Ambulatory Visit: Payer: Self-pay | Admitting: Cardiovascular Disease

## 2018-05-24 DIAGNOSIS — M7542 Impingement syndrome of left shoulder: Secondary | ICD-10-CM | POA: Diagnosis not present

## 2018-05-24 DIAGNOSIS — M25512 Pain in left shoulder: Secondary | ICD-10-CM | POA: Diagnosis not present

## 2018-06-07 ENCOUNTER — Other Ambulatory Visit: Payer: Self-pay | Admitting: Cardiovascular Disease

## 2018-07-06 DIAGNOSIS — H2513 Age-related nuclear cataract, bilateral: Secondary | ICD-10-CM | POA: Diagnosis not present

## 2018-07-22 ENCOUNTER — Other Ambulatory Visit: Payer: Self-pay | Admitting: Cardiovascular Disease

## 2018-07-22 NOTE — Telephone Encounter (Signed)
OV with DR Claiborne Billings on jan/2020 72yo Male 96.9kg Scr = 0.9 on 12/03/2017 ([facility] @ [place])

## 2018-07-24 ENCOUNTER — Other Ambulatory Visit: Payer: Self-pay | Admitting: Cardiovascular Disease

## 2018-10-12 ENCOUNTER — Other Ambulatory Visit: Payer: Self-pay

## 2018-10-28 ENCOUNTER — Other Ambulatory Visit: Payer: Self-pay | Admitting: Cardiovascular Disease

## 2018-11-25 DIAGNOSIS — Z23 Encounter for immunization: Secondary | ICD-10-CM | POA: Diagnosis not present

## 2018-12-06 DIAGNOSIS — E785 Hyperlipidemia, unspecified: Secondary | ICD-10-CM | POA: Diagnosis not present

## 2018-12-06 DIAGNOSIS — Z0000 Encounter for general adult medical examination without abnormal findings: Secondary | ICD-10-CM | POA: Diagnosis not present

## 2018-12-06 DIAGNOSIS — I2510 Atherosclerotic heart disease of native coronary artery without angina pectoris: Secondary | ICD-10-CM | POA: Diagnosis not present

## 2018-12-06 DIAGNOSIS — I10 Essential (primary) hypertension: Secondary | ICD-10-CM | POA: Diagnosis not present

## 2018-12-06 DIAGNOSIS — E8881 Metabolic syndrome: Secondary | ICD-10-CM | POA: Diagnosis not present

## 2018-12-06 DIAGNOSIS — Z79899 Other long term (current) drug therapy: Secondary | ICD-10-CM | POA: Diagnosis not present

## 2018-12-06 DIAGNOSIS — N400 Enlarged prostate without lower urinary tract symptoms: Secondary | ICD-10-CM | POA: Diagnosis not present

## 2019-01-22 ENCOUNTER — Other Ambulatory Visit: Payer: Self-pay | Admitting: Cardiovascular Disease

## 2019-01-24 ENCOUNTER — Ambulatory Visit (INDEPENDENT_AMBULATORY_CARE_PROVIDER_SITE_OTHER): Payer: Medicare Other | Admitting: Cardiovascular Disease

## 2019-01-24 ENCOUNTER — Encounter: Payer: Self-pay | Admitting: Cardiovascular Disease

## 2019-01-24 ENCOUNTER — Other Ambulatory Visit: Payer: Self-pay

## 2019-01-24 DIAGNOSIS — E785 Hyperlipidemia, unspecified: Secondary | ICD-10-CM

## 2019-01-24 DIAGNOSIS — I480 Paroxysmal atrial fibrillation: Secondary | ICD-10-CM | POA: Diagnosis not present

## 2019-01-24 DIAGNOSIS — E669 Obesity, unspecified: Secondary | ICD-10-CM | POA: Diagnosis not present

## 2019-01-24 DIAGNOSIS — I10 Essential (primary) hypertension: Secondary | ICD-10-CM

## 2019-01-24 DIAGNOSIS — Z7901 Long term (current) use of anticoagulants: Secondary | ICD-10-CM | POA: Diagnosis not present

## 2019-01-24 DIAGNOSIS — I2510 Atherosclerotic heart disease of native coronary artery without angina pectoris: Secondary | ICD-10-CM

## 2019-01-24 NOTE — Progress Notes (Signed)
Patient ID: CHANZ CAHALL, male   DOB: [birth year], 73 y.o.   MRN: [id number]    HPI:  Mr. Lamone Ferrelli is a 73 year old male who is a former patient of Dr. Terance Ice. 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  He has established CAD and in 1999 underwent a self-expanding radius stent to his LAD. His last nuclear perfusion study was in May 2013 which was low risk and without ischemia an echo Doppler study showed an ejection fraction in the 55-60% range. He had mild LA dilatation. The patient has a history of sick sinus syndrome with paroxysmal atrial fibrillation and remotely had been on amiodarone but developed skin toxicity secondary to do that secondary to this. Apparently he has undergone 2 radiofrequency ablations at [facility] by Dr. Ola Spurr initially in February 2009 and he required a second procedure in December 2009 for recurrent atrial fibrillation. He had developed a recurrent episode of atrial fibrillation several years ago at which time he underwent cardioversion and has been maintaining sinus rhythm with a rate coagulation therapy.  He last saw Dr. Rollene Fare in March 2014 at which time he was felt to be stable on his current medical regimen which consisted of Lopressor 25 twice a day, multaq 400 twice a day,  Xarelto 20 mg daily in addition to his atorvastatin 20 mg and fish oil for hyperlipidemia.   In 2015 he underwent colonoscopy.  He held his Xarelto for the procedure.  He was found of 6 polyps and will require repeat colonoscopy in 3 years.  When I saw him in 2017 he had experienced one episode of atrial fibrillation several weeks prior.  This occurred after working to significant exhaustion when he was straining, lifting sheet rock and remodeling a bathroom.    I saw him in November 2018 at which time he denied  any awareness of recurrent palpitations or atrial fibrillation.  He is retired from [employer] and has a part-time job as a Clinical biochemist.  However, he states he often is working at least 50 hours per week in this "part-time "position.  At that time he was on atorvastatin for hyperlipidemia, lisinopril 5 mg and metoprolol tartrate 25 mg twice a day for hypertension and PAF in addition to Xarelto anticoagulation.  Laboratory by Dr. Ann Held.  on 11/27/2016:  total cholesterol was 142, HDL 44, LDL 79, triglycerides 94.  Renal function was stable with a creatinine of 0.97.  LFTs were normal.  TSH was 1.3.    I last saw him in January 2020 at which time he had  remained stable.  However  2 weeks prior to that evaluation he began to notice some URI type symptoms. While getting ready to do a church service he felt his heart become irregular and he believes he was in atrial fibrillation for 28 hours duration with a rate averaging around 110 bpm.  He did not take any extra metoprolol.  Ultimately he reverted back to sinus rhythm.  He later saw his primary physician and was diagnosed with bronchitis and received antibiotic therapy in addition to Spiriva and cough medication.  When I saw him, he denied any recurrent episode of atrial fibrillation since that episode and he denied any chest pressure.  He continued to be on Xarelto.  Throughout the past year, he  has continued to be stable.  He denies any episodes of chest pain or recurrent arrhythmia.  He continues to be on lisinopril 10 mg daily and metoprolol 25 mg twice a day in addition to Xarelto 20 mg daily.  He is on atorvastatin 40 mg for hyperlipidemia and continues to take over-the-counter fish oil.  He is on aspirin.  He has been walking at least 5 days/week.  He remains asymptomatic without anginal symptoms or exertional dyspnea.  He presents for reevaluation.  Past Medical History:  Diagnosis  Date  . Atrial fibrillation ([place])   . CAD (coronary artery disease)    LAD stent in 1999  . Hyperlipidemia   . Hypertension   . PAF (paroxysmal atrial fibrillation) ([place])    cardioversion in 2013  . SSS (sick sinus syndrome) [facility])     Past Surgical History:  Procedure Laterality Date  . CARDIAC ELECTROPHYSIOLOGY STUDY AND ABLATION  2008, 2009   RF ablation (x4) [facility] - Dr. Ola Spurr)  . CARDIOVERSION  06/15/2011   Procedure: CARDIOVERSION;  Surgeon: Pixie Casino, MD;  Location: [facility];  Service: Cardiovascular;  Laterality: N/A;  . CORONARY ANGIOPLASTY WITH STENT PLACEMENT  10/29/1997   .0x20 self-expanding radius stent to LAD (Dr. Marella Chimes)  . NM MYOCAR PERF WALL MOTION  07/2011   bruce myoview; partially fixed apical defect, worse at rest than stress; fixed inferobasal diaphragmatic attenuation artifact; no reversible ischemia; post-stress EF 60%; short run of NSVT; low risk scan   . TRANSTHORACIC ECHOCARDIOGRAM  06/13/2011   EF 55-60%, mod conc LVH; LA mildly dilated    Allergies  Allergen Reactions  . Nitroglycerin Other (See Comments)    "Blood Pressure goes to zero"    Current Outpatient Medications  Medication Sig Dispense Refill  . aspirin 81 MG chewable tablet Chew 81 mg by mouth daily.    Marland Kitchen atorvastatin (LIPITOR) 40 MG tablet Take 1 tablet (40 mg total) by mouth daily at 6 PM. 90 tablet 1  . esomeprazole (NEXIUM) 20 MG capsule Take 20 mg by mouth daily at 12 noon.    . Garlic 5056 MG CAPS Take 1 capsule by mouth daily.    Marland Kitchen lisinopril (PRINIVIL,ZESTRIL) 10 MG tablet Take 10 mg by mouth daily.    . metoprolol tartrate (LOPRESSOR) 25 MG tablet Take 1 tablet by mouth twice daily 180 tablet 3  . Omega-3 Fatty Acids (OMEGA 3 PO) Take by mouth.    Alveda Reasons 20 MG TABS tablet TAKE 1 TABLET BY MOUTH ONCE DAILY WITH SUPPER 90 tablet 0   No current facility-administered medications for this visit.     Social history is notable in that he is married for 48  years. He has 3 children 10 grandchildren. 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  Family History  Problem Relation Age of Onset  . Coronary artery disease Father        MI  . Hypertension Father   . Diabetes Mother   . Hypertension Sister   . Atrial fibrillation Child   . Hypertension Child    ROS General: Negative; No fevers, chills, or night sweats; positive for weight gain HEENT: Remote history of photosensitivity when he was on amiodarone; No changes in vision or hearing, sinus congestion, difficulty swallowing Pulmonary: Recent bronchitis with mild wheezing Cardiovascular: See history of present illness; No chest pain, presyncope, syncope, palpitations GI: Negative; No nausea, vomiting, diarrhea, or abdominal pain GU: Negative; No dysuria, hematuria, or difficulty voiding Musculoskeletal: Negative;  no myalgias, joint pain, or weakness Hematologic/Oncology: Negative; no easy bruising, bleeding Endocrine: Negative; no heat/cold intolerance; no diabetes Neuro: Negative; no changes in balance, headaches Skin: Negative; No rashes or skin lesions Psychiatric: Negative; No behavioral problems, depression Sleep: Negative; No snoring, daytime sleepiness, hypersomnolence, bruxism, restless legs, hypnogognic hallucinations, no cataplexy Other comprehensive 14 point system review is negative.   PE BP 136/75   Pulse (!) 55   Ht 5' 6" (1.676 m)   Wt 191 lb 3.2 oz (86.7 kg)   SpO2 98%   BMI 30.86 kg/m    Repeat blood pressure by me 130/78  Wt Readings from Last 3 Encounters:  01/24/19 191 lb 3.2 oz (86.7 kg)  03/25/18 213 lb (96.6 kg)  01/27/17 206 lb 9.6 oz (93.7 kg)    General: Alert, oriented, no distress.  Skin: normal turgor, no rashes, warm and dry HEENT: Normocephalic, atraumatic. Pupils equal round and reactive to light; sclera anicteric; extraocular muscles intact;  Nose without nasal septal hypertrophy Mouth/Parynx benign; Mallinpatti scale 3 Neck: No JVD,  no carotid bruits; normal carotid upstroke Lungs: clear to ausculatation and percussion; no wheezing or rales Chest wall: without tenderness to palpitation Heart: PMI not displaced, RRR, s1 s2 normal, 1/6 systolic murmur, no diastolic murmur, no rubs, gallops, thrills, or heaves Abdomen: Mild central adiposity; soft, nontender; no hepatosplenomehaly, BS+; abdominal aorta nontender and not dilated by palpation. Back: no CVA tenderness Pulses 2+ Musculoskeletal: full range of motion, normal strength, no joint deformities Extremities: no clubbing cyanosis or edema, Homan's sign negative  Neurologic: grossly nonfocal; Cranial nerves grossly wnl Psychologic: Normal mood and affect   ECG (independently read by me): Sinus bradycardia 55 bpm.  No ectopy.  Normal intervals.  January 2020 ECG (independently read by me): Sinus bradycardia 55 bpm.  Normal intervals.  No ectopy.  November 2018 ECG (independently read by me): Sinus bradycardia 52 bpm.  Normal intervals.  No ST segment changes.  October 2017 ECG (independently read by me): Sinus rhythm at 59 bpm.  No significant ST-T changes.  QTc interval 461 ms.  August 2015 ECG (independently read by me): Sinus bradycardia 57 beats per minute.  QTc interval 455 ms.  January  2015 ECG (independently read by me):  Sinus bradycardia 54 beats per minute. Normal intervals. No ectopy.  Prior ECG of 01/05/2013: normal sinus rhythm at 62 beats per minute; PR interval 146 ms; QTc interval 468 ms.   LABS: BMP Latest Ref Rng & Units 04/30/2017 11/22/2014 08/31/2013  Glucose 65 - 99 mg/dL 106(H) 104(H) 104(H)  BUN 8 - 27 mg/dL _0 Creatinine 0.76 - 1.27 mg/dL 0.79 1.01 1.01  BUN/Creat Ratio 10 - 24 19 - -  Sodium 134 - 144 mmol/L 138 139 138  Potassium 3.5 - 5.2 mmol/L 4.3 4.6 4.3  Chloride 96 - 106 mmol/L 105 105 105  CO2 20 - 29 mmol/L _1 Calcium 8.6 - 10.2 mg/dL 9.0 8.9 8.8   Hepatic Function Latest Ref Rng & Units 04/30/2017 11/22/2014  08/31/2013  Total Protein 6.0 - 8.5 g/dL 6.5 6.4 6.7  Albumin 3.6 - 4.8 g/dL 4.3 4.1 4.1  AST 0 - 40 IU/L _2 ALT 0 - 44 IU/L 32 37 23  Alk Phosphatase 39 - 117 IU/L 58 58 58  Total Bilirubin 0.0 - 1.2 mg/dL 0.8 1.0 0.8   CBC Latest Ref Rng & Units 11/22/2014 08/31/2013 06/15/2011  WBC 4.0 - 10.5 K/uL 7.7 6.9 8.3  Hemoglobin 13.0 - 17.0 g/dL 14.4 14.0 14.1  Hematocrit 39.0 - 52.0 % 42.0 40.3 41.6  Platelets 150 - 400 K/uL 258 235 234   Lab Results  Component Value Date   MCV 91.5 11/22/2014   MCV 89.8 08/31/2013   MCV 91.4 06/15/2011   Lab Results  Component Value Date   TSH 1.303 11/22/2014   Lipid Panel     Component Value Date/Time   CHOL 125 04/30/2017 1046   TRIG 98 04/30/2017 1046   HDL 48 04/30/2017 1046   CHOLHDL 2.6 04/30/2017 1046   CHOLHDL 2.9 11/22/2014 0856   VLDL 29 11/22/2014 0856   LDLCALC 57 04/30/2017 1046   RADIOLOGY: No results found.  IMPRESSION: 1. Essential hypertension   2. PAF (paroxysmal atrial fibrillation) ([place])   3. Anticoagulation adequate   4. Coronary artery disease involving native coronary artery of native heart without angina pectoris: Radiau stent 1999 to LAD   5. Hyperlipidemia with target LDL less than 70   6. Mild obesity      ASSESSMENT AND PLAN: Mr. Salsgiver is a 73 year-old white male who has established CAD and underwent insertion of a self-expanding Radius stent to his LAD in 1999.  His last nuclear study in May 2013 was low risk without ischemia.  He is a history of sick sinus syndrome with paroxysmal atrial fibrillation and developed amiodarone skin toxicity.  He is status post 2 radiofrequency ablations.  When I saw him in January 2020 he had experienced one episode of recurrent atrial fibrillation during.  He has been without any recurrent episodes of atrial fibrillation for greater than 2 years but 2 weeks previous to that evaluation had experienced an episode of AF which he believes lasted from 11 AM on Sunday until 3 PM  on Monday with an average ventricular rate at 110 bpm.  This ultimately broke spontaneously and he has been in sinus rhythm since.  He has not had any recurrent AF since that time.  He continues to be on Xarelto for anticoagulation.  ECG today is stable and blood pressure is controlled on metoprolol tartrate 25 mg twice a day in addition to lisinopril 10 mg daily.  He continues to atorvastatin 40 mg and omega-3 fatty acid for hyperlipidemia.  I reviewed recent laboratory done by [facility] family physicians on December 06, 2018.  Labs were excellent.  Total cholesterol was 125, triglycerides 97, HDL 42, and LDL 64.  Hemoglobin and hematocrit were stable.  He had normal chemistry evaluation with normal LFTs and a BUN of 17 and creatinine of 0.9.  He continues to take Nexium for GERD which is controlled.  BMI is increased at 30.86 and consistent with mild obesity.  We discussed weight loss.  He is walking 5 days/week.  He will be seeing [facility] family physicians in 6 months.  I will see him in 1 year for reevaluation or sooner if problems arise.   Time spent: 25 minutes  Troy Sine, MD, [facility] 01/25/2019 12:59 PM

## 2019-01-24 NOTE — Patient Instructions (Addendum)
Follow-Up: IN 12 months Please call our office 2 months in advance, SEPT 2021 to schedule this NOV 2021 appointment. In Person You may see Shelva Majestic, MD or one of the following Advanced Practice Providers on your designated Care Team:  Almyra Deforest, PA-C  Fabian Sharp, PA-C or [place], [place].    Medication Instructions:  The current medical regimen is effective;  continue present plan and medications as directed. Please refer to the Current Medication list given to you today. If you need a refill on your cardiac medications before your next appointment, please call your pharmacy.  At [facility], you and your health needs are our priority.  As part of our continuing mission to provide you with exceptional heart care, we have created designated Provider Care Teams.  These Care Teams include your primary Cardiologist (physician) and Advanced Practice Providers (APPs -  Physician Assistants and Nurse Practitioners) who all work together to provide you with the care you need, when you need it.  Thank you for choosing [facility] at [facility]!!

## 2019-01-25 ENCOUNTER — Encounter: Payer: Self-pay | Admitting: Cardiovascular Disease

## 2019-04-20 ENCOUNTER — Other Ambulatory Visit: Payer: Self-pay | Admitting: Cardiovascular Disease

## 2019-04-26 ENCOUNTER — Other Ambulatory Visit: Payer: Self-pay | Admitting: Cardiovascular Disease

## 2019-06-05 ENCOUNTER — Other Ambulatory Visit: Payer: Self-pay | Admitting: Cardiovascular Disease

## 2019-07-19 ENCOUNTER — Other Ambulatory Visit: Payer: Self-pay | Admitting: Cardiovascular Disease

## 2019-07-19 NOTE — Telephone Encounter (Signed)
Prescription refill request for Xarelto received.   Last office visit: Timothy [name] 01/24/2019  Weight: 86.7 kg  Age: 74 y.o.  Scr: 0.9 02/06/2019  CrCl: 92 ml/min   Prescription refill sent.

## 2019-09-02 ENCOUNTER — Other Ambulatory Visit: Payer: Self-pay | Admitting: Cardiovascular Disease

## 2020-01-15 ENCOUNTER — Other Ambulatory Visit: Payer: Self-pay | Admitting: Cardiovascular Disease

## 2020-01-24 ENCOUNTER — Other Ambulatory Visit: Payer: Self-pay | Admitting: Cardiovascular Disease

## 2020-02-15 ENCOUNTER — Ambulatory Visit (INDEPENDENT_AMBULATORY_CARE_PROVIDER_SITE_OTHER): Payer: Medicare Other | Admitting: Cardiovascular Disease

## 2020-02-15 ENCOUNTER — Other Ambulatory Visit: Payer: Self-pay

## 2020-02-15 ENCOUNTER — Encounter: Payer: Self-pay | Admitting: Cardiovascular Disease

## 2020-02-15 DIAGNOSIS — I480 Paroxysmal atrial fibrillation: Secondary | ICD-10-CM

## 2020-02-15 DIAGNOSIS — I10 Essential (primary) hypertension: Secondary | ICD-10-CM | POA: Diagnosis not present

## 2020-02-15 DIAGNOSIS — E785 Hyperlipidemia, unspecified: Secondary | ICD-10-CM | POA: Diagnosis not present

## 2020-02-15 DIAGNOSIS — I2510 Atherosclerotic heart disease of native coronary artery without angina pectoris: Secondary | ICD-10-CM | POA: Diagnosis not present

## 2020-02-15 DIAGNOSIS — E669 Obesity, unspecified: Secondary | ICD-10-CM

## 2020-02-15 DIAGNOSIS — K219 Gastro-esophageal reflux disease without esophagitis: Secondary | ICD-10-CM

## 2020-02-15 LAB — COMPREHENSIVE METABOLIC PANEL
ALT: 58 IU/L — ABNORMAL HIGH (ref 0–44)
AST: 37 IU/L (ref 0–40)
Albumin/Globulin Ratio: 2 (ref 1.2–2.2)
Albumin: 4.3 g/dL (ref 3.7–4.7)
Alkaline Phosphatase: 63 IU/L (ref 44–121)
BUN/Creatinine Ratio: 17 (ref 10–24)
BUN: 15 mg/dL (ref 8–27)
Bilirubin Total: 0.9 mg/dL (ref 0.0–1.2)
CO2: 22 mmol/L (ref 20–29)
Calcium: 9 mg/dL (ref 8.6–10.2)
Chloride: 104 mmol/L (ref 96–106)
Creatinine, Ser: 0.9 mg/dL (ref 0.76–1.27)
GFR calc Af Amer: 98 mL/min/{1.73_m2} (ref >59)
GFR calc non Af Amer: 85 mL/min/{1.73_m2} (ref >59)
Globulin, Total: 2.2 g/dL (ref 1.5–4.5)
Glucose: 110 mg/dL — ABNORMAL HIGH (ref 65–99)
Potassium: 4.2 mmol/L (ref 3.5–5.2)
Sodium: 140 mmol/L (ref 134–144)
Total Protein: 6.5 g/dL (ref 6.0–8.5)

## 2020-02-15 LAB — LIPID PANEL
Chol/HDL Ratio: 2.8 ratio (ref 0.0–5.0)
Cholesterol, Total: 131 mg/dL (ref 100–199)
HDL: 47 mg/dL (ref >39)
LDL Chol Calc (NIH): 63 mg/dL (ref 0–99)
Triglycerides: 120 mg/dL (ref 0–149)
VLDL Cholesterol Cal: 21 mg/dL (ref 5–40)

## 2020-02-15 LAB — CBC
Hematocrit: 40.2 % (ref 37.5–51.0)
Hemoglobin: 14.2 g/dL (ref 13.0–17.7)
MCH: 31.3 pg (ref 26.6–33.0)
MCHC: 35.3 g/dL (ref 31.5–35.7)
MCV: 89 fL (ref 79–97)
Platelets: 240 10*3/uL (ref 150–450)
RBC: 4.54 x10E6/uL (ref 4.14–5.80)
RDW: 12.8 % (ref 11.6–15.4)
WBC: 7.3 10*3/uL (ref 3.4–10.8)

## 2020-02-15 LAB — TSH: TSH: 1.69 u[IU]/mL (ref 0.450–4.500)

## 2020-02-15 MED ORDER — OLMESARTAN MEDOXOMIL 20 MG PO TABS: 20.0000 mg | ORAL_TABLET | Freq: Every day | ORAL | 3 refills | Status: DC

## 2020-02-15 NOTE — Progress Notes (Signed)
Patient ID: TALLY MCKINNON, male   DOB: 15-Jul-1945, 74 y.o.   MRN: [id number]    HPI:  Mr. Goku Harb is a 74 year old male who is a former patient of Dr.Richard Rollene Fare. 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  He has established CAD and in 1999 underwent a self-expanding radius stent to his LAD. His last nuclear perfusion study was in May 2013 which was low risk and without ischemia an echo Doppler study showed an ejection fraction in the 55-60% range. He had mild LA dilatation. The patient has a history of sick sinus syndrome with paroxysmal atrial fibrillation and remotely had been on amiodarone but developed skin toxicity secondary to do that secondary to this. Apparently he has undergone 2 radiofrequency ablations at [facility] by Dr. Ola Spurr initially in February 2009 and he required a second procedure in December 2009 for recurrent atrial fibrillation. He had developed a recurrent episode of atrial fibrillation several years ago at which time he underwent cardioversion and has been maintaining sinus rhythm with a rate coagulation therapy.  He last saw Dr. Rollene Fare in March 2014 at which time he was felt to be stable on his current medical regimen which consisted of Lopressor 25 twice a day, multaq 400 twice a day,  Xarelto 20 mg daily in addition to his atorvastatin 20 mg and fish oil for hyperlipidemia.   In 2015 he underwent colonoscopy.  He held his Xarelto for the procedure.  He was found of 6 polyps and will require repeat colonoscopy in 3 years.  When I saw him in 2017 he had experienced one episode of atrial fibrillation several weeks prior.  This occurred after working to significant exhaustion when he was straining, lifting sheet rock and remodeling a bathroom.     I saw him in November 2018 at which time he denied  any awareness of recurrent palpitations or atrial fibrillation.  He is retired from [employer] and has a part-time job as a Clinical biochemist.  However, he states he often is working at least 50 hours per week in this "part-time "position.  At that time he was on atorvastatin for hyperlipidemia, lisinopril 5 mg and metoprolol tartrate 25 mg twice a day for hypertension and PAF in addition to Xarelto anticoagulation.  Laboratory by Dr. Ann Held.  on 11/27/2016:  total cholesterol was 142, HDL 44, LDL 79, triglycerides 94.  Renal function was stable with a creatinine of 0.97.  LFTs were normal.  TSH was 1.3.    I saw him in January 2020 at which time he had  remained stable.  However  2 weeks prior to that evaluation he began to notice some URI type symptoms. While getting ready to do a church service he felt his heart become irregular and he believes he was in atrial fibrillation for 28 hours duration with a rate averaging around 110 bpm.  He did not take any extra metoprolol.  Ultimately he reverted back to sinus rhythm.  He later saw his primary physician and was diagnosed with bronchitis and received antibiotic therapy in addition to Spiriva and cough medication.  When I saw him, he denied any recurrent episode of atrial fibrillation since that episode and he denied any chest pressure.  He continued to be on Xarelto.  I last saw him in November  2020.  At that time he admitted that he continued to be stable and he denied any episodes of chest pain, exertional dyspnea or recurrent arrhythmia.  He continued to be on lisinopril 10 mg daily and metoprolol 25 mg twice a day in addition to Xarelto 20 mg daily.  He was on atorvastatin 40 mg for hyperlipidemia and continues to take over-the-counter fish oil.  He is on aspirin.  He was walking at least 5 days/week.    Since I last saw him, continues to work as a Clinical biochemist at [employer].  He states his hours are 50 to 60 hours/week.  He admits to cough most likely secondary to lisinopril.  He denies any recurrent chest pain.  He admits to weight gain has not been exercising as he had in the past due to his increased work.  He presents for yearly evaluation.  Past Medical History:  Diagnosis Date  . Atrial fibrillation ([place])   . CAD (coronary artery disease)    LAD stent in 1999  . Hyperlipidemia   . Hypertension   . PAF (paroxysmal atrial fibrillation) ([place])    cardioversion in 2013  . SSS (sick sinus syndrome) [facility])     Past Surgical History:  Procedure Laterality Date  . CARDIAC ELECTROPHYSIOLOGY STUDY AND ABLATION  2008, 2009   RF ablation (x4) [facility] - Dr. Ola Spurr)  . CARDIOVERSION  06/15/2011   Procedure: CARDIOVERSION;  Surgeon: Pixie Casino, MD;  Location: [place];  Service: Cardiovascular;  Laterality: N/A;  . CORONARY ANGIOPLASTY WITH STENT PLACEMENT  10/29/1997   .0x20 self-expanding radius stent to LAD (Dr. Marella Chimes)  . NM MYOCAR PERF WALL MOTION  07/2011   bruce myoview; partially fixed apical defect, worse at rest than stress; fixed inferobasal diaphragmatic attenuation artifact; no reversible ischemia; post-stress EF 60%; short run of NSVT; low risk scan   . TRANSTHORACIC ECHOCARDIOGRAM  06/13/2011   EF 55-60%, mod conc LVH; LA mildly dilated    Allergies  Allergen Reactions  . Nitroglycerin Other (See Comments)    "Blood Pressure goes to zero"    Current Outpatient Medications  Medication Sig Dispense Refill  . aspirin 81 MG chewable tablet Chew 81 mg by mouth daily.    Marland Kitchen atorvastatin (LIPITOR) 40 MG tablet TAKE 1 TABLET BY MOUTH ONCE DAILY 6 IN THE EVENING 90 tablet 3  . esomeprazole (NEXIUM) 20 MG capsule Take 20 mg by mouth daily at 12 noon.    . Garlic 3559 MG CAPS Take 1 capsule by mouth daily.    . metoprolol tartrate (LOPRESSOR) 25 MG tablet Take 1 tablet by mouth twice daily 180 tablet 1  . Omega-3 Fatty Acids  (OMEGA 3 PO) Take 1,000 mg by mouth.     Alveda Reasons 20 MG TABS tablet TAKE 1 TABLET BY MOUTH ONCE DAILY WITH SUPPER 90 tablet 0  . olmesartan (BENICAR) 20 MG tablet Take 1 tablet (20 mg total) by mouth daily. 90 tablet 3   No current facility-administered medications for this visit.    Social history is notable in that he is married for 48 years. He has 3 children 10 grandchildren. 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  Family History  Problem Relation Age of Onset  . Coronary artery disease Father        MI  . Hypertension Father   . Diabetes Mother   . Hypertension Sister   . Atrial fibrillation Child   . Hypertension Child  ROS General: Negative; No fevers, chills, or night sweats; positive for weight gain HEENT: Remote history of photosensitivity when he was on amiodarone; No changes in vision or hearing, sinus congestion, difficulty swallowing Pulmonary: Recent bronchitis with mild wheezing Cardiovascular: See history of present illness; No chest pain, presyncope, syncope, palpitations GI: Negative; No nausea, vomiting, diarrhea, or abdominal pain GU: Negative; No dysuria, hematuria, or difficulty voiding Musculoskeletal: Negative; no myalgias, joint pain, or weakness Hematologic/Oncology: Negative; no easy bruising, bleeding Endocrine: Negative; no heat/cold intolerance; no diabetes Neuro: Negative; no changes in balance, headaches Skin: Negative; No rashes or skin lesions Psychiatric: Negative; No behavioral problems, depression Sleep: Negative; No snoring, daytime sleepiness, hypersomnolence, bruxism, restless legs, hypnogognic hallucinations, no cataplexy Other comprehensive 14 point system review is negative.   PE BP 132/90 (BP Location: Left Arm, Patient Position: Sitting)   Pulse 62   Ht _0  (1.676 m)   Wt 212 lb 3.2 oz (96.3 kg)   SpO2 98%   BMI 34.25 kg/m    Repeat blood pressure by me was 142/86  Wt Readings from Last 3 Encounters:  02/15/20  212 lb 3.2 oz (96.3 kg)  01/24/19 191 lb 3.2 oz (86.7 kg)  03/25/18 213 lb (96.6 kg)    General: Alert, oriented, no distress.  Skin: normal turgor, no rashes, warm and dry HEENT: Normocephalic, atraumatic. Pupils equal round and reactive to light; sclera anicteric; extraocular muscles intact;  Nose without nasal septal hypertrophy Mouth/Parynx benign; Mallinpatti scale 3 Neck: No JVD, no carotid bruits; normal carotid upstroke Lungs: clear to ausculatation and percussion; no wheezing or rales Chest wall: without tenderness to palpitation Heart: PMI not displaced, RRR, s1 s2 normal, 1/6 systolic murmur, no diastolic murmur, no rubs, gallops, thrills, or heaves Abdomen: soft, nontender; no hepatosplenomehaly, BS+; abdominal aorta nontender and not dilated by palpation. Back: no CVA tenderness Pulses 2+ Musculoskeletal: full range of motion, normal strength, no joint deformities Extremities: no clubbing cyanosis or edema, Homan's sign negative  Neurologic: grossly nonfocal; Cranial nerves grossly wnl Psychologic: Normal mood and affect   ECG (independently read by me): NSR at 62, normal intervals  January 24, 2019 ECG (independently read by me): Sinus bradycardia 55 bpm.  No ectopy.  Normal intervals.  January 2020 ECG (independently read by me): Sinus bradycardia 55 bpm.  Normal intervals.  No ectopy.  November 2018 ECG (independently read by me): Sinus bradycardia 52 bpm.  Normal intervals.  No ST segment changes.  October 2017 ECG (independently read by me): Sinus rhythm at 59 bpm.  No significant ST-T changes.  QTc interval 461 ms.  August 2015 ECG (independently read by me): Sinus bradycardia 57 beats per minute.  QTc interval 455 ms.  January  2015 ECG (independently read by me):  Sinus bradycardia 54 beats per minute. Normal intervals. No ectopy.  Prior ECG of 01/05/2013: normal sinus rhythm at 62 beats per minute; PR interval 146 ms; QTc interval 468 ms.   LABS: BMP  Latest Ref Rng & Units 04/30/2017 11/22/2014 08/31/2013  Glucose 65 - 99 mg/dL 106(H) 104(H) 104(H)  BUN 8 - 27 mg/dL _1 Creatinine 0.76 - 1.27 mg/dL 0.79 1.01 1.01  BUN/Creat Ratio 10 - 24 19 - -  Sodium 134 - 144 mmol/L 138 139 138  Potassium 3.5 - 5.2 mmol/L 4.3 4.6 4.3  Chloride 96 - 106 mmol/L 105 105 105  CO2 20 - 29 mmol/L _2 Calcium 8.6 - 10.2 mg/dL 9.0 8.9 8.8  Hepatic Function Latest Ref Rng & Units 04/30/2017 11/22/2014 08/31/2013  Total Protein 6.0 - 8.5 g/dL 6.5 6.4 6.7  Albumin 3.6 - 4.8 g/dL 4.3 4.1 4.1  AST 0 - 40 IU/L _0 ALT 0 - 44 IU/L 32 37 23  Alk Phosphatase 39 - 117 IU/L 58 58 58  Total Bilirubin 0.0 - 1.2 mg/dL 0.8 1.0 0.8   CBC Latest Ref Rng & Units 11/22/2014 08/31/2013 06/15/2011  WBC 4.0 - 10.5 K/uL 7.7 6.9 8.3  Hemoglobin 13.0 - 17.0 g/dL 14.4 14.0 14.1  Hematocrit 39 - 52 % 42.0 40.3 41.6  Platelets 150 - 400 K/uL 258 235 234   Lab Results  Component Value Date   MCV 91.5 11/22/2014   MCV 89.8 08/31/2013   MCV 91.4 06/15/2011   Lab Results  Component Value Date   TSH 1.303 11/22/2014   Lipid Panel     Component Value Date/Time   CHOL 125 04/30/2017 1046   TRIG 98 04/30/2017 1046   HDL 48 04/30/2017 1046   CHOLHDL 2.6 04/30/2017 1046   CHOLHDL 2.9 11/22/2014 0856   VLDL 29 11/22/2014 0856   LDLCALC 57 04/30/2017 1046   RADIOLOGY: No results found.  IMPRESSION: 1. Coronary artery disease involving native coronary artery of native heart without angina pectoris   2. PAF, long Hx of PAF, s/p prior RFA, past Amio intol (skin)   3. Hyperlipidemia with target LDL less than 70   4. Essential hypertension   5. Obesity (BMI 30.0-34.9)   6. Gastroesophageal reflux disease without esophagitis      ASSESSMENT AND PLAN: Mr. Dyment is a 74 year-old white male who has established CAD and underwent insertion of a self-expanding Radius stent to his LAD in 1999.  His last nuclear study in May 2013 was low risk without ischemia.  He is a  history of sick sinus syndrome with paroxysmal atrial fibrillation and developed amiodarone skin toxicity.  He is status post 2 radiofrequency ablations.  When I saw him in January 2020 he had experienced one episode of recurrent atrial fibrillation during.  He has been without any recurrent episodes of atrial fibrillation for greater than 2 years but 2 weeks previous to that evaluation had experienced an episode of AF which he believes lasted from 11 AM on Sunday until 3 PM on Monday with an average ventricular rate at 110 bpm.  This ultimately broke spontaneously and he has been in sinus rhythm since.  He has not had any recurrent AF since that time.  He continues to be on Xarelto for anticoagulation.  Presently, his blood pressure today is elevated.  He has been taking lisinopril 10 mg in addition to metoprolol tartrate 25 mg twice a day.  He also has experienced a cough which most likely is due to his lisinopril.  For this reason, I have recommended he discontinue lisinopril and will change him to olmesartan 20 mg daily which should provide additional blood pressure control without cough.  He has not had recent laboratory.  I am checking a comprehensive metabolic panel, CBC, TSH and lipid studies today.  Since I last saw him over 1 year ago he has gained 21 pounds.  We discussed weight loss and resumption of exercise.  I discussed Heart Association recommendation of at least 5 days/week for 30 minutes of moderate intensity if at all possible.  He continues to be on Nexium for GERD.  I will notify him regarding his laboratory and adjustments will be  made if necessary.  I will see him in 4 months for reevaluation.    Troy Sine, MD, [facility] 02/15/2020 12:05 PM

## 2020-02-15 NOTE — Patient Instructions (Signed)
Medication Instructions:  STOP taking lisinopril START taking olmesartan 20 mg by mouth once a day *If you need a refill on your cardiac medications before your next appointment, please call your pharmacy*   Lab Work: CBC, CMET, lipids, TSH in our office today. If you have labs (blood work) drawn today and your tests are completely normal, you will receive your results only by:  [place] (if you have MyChart) OR  A paper copy in the mail If you have any lab test that is abnormal or we need to change your treatment, we will call you to review the results.   Testing/Procedures: None ordered   Follow-Up: At [facility], you and your health needs are our priority.  As part of our continuing mission to provide you with exceptional heart care, we have created designated Provider Care Teams.  These Care Teams include your primary Cardiologist (physician) and Advanced Practice Providers (APPs -  Physician Assistants and Nurse Practitioners) who all work together to provide you with the care you need, when you need it.  We recommend signing up for the patient portal called "MyChart".  Sign up information is provided on this After Visit Summary.  MyChart is used to connect with patients for Virtual Visits (Telemedicine).  Patients are able to view lab/test results, encounter notes, upcoming appointments, etc.  Non-urgent messages can be sent to your provider as well.   To learn more about what you can do with MyChart, go to [email or web address].    Your next appointment:   4 month(s)  The format for your next appointment:   In Person  Provider:   Shelva Majestic, MD   Other Instructions None

## 2020-02-19 ENCOUNTER — Telehealth: Payer: Self-pay | Admitting: Cardiovascular Disease

## 2020-02-19 NOTE — Telephone Encounter (Signed)
Pt c/o medication issue:  1. Name of Medication:   olmesartan (BENICAR) 20 MG tablet    2. How are you currently taking this medication (dosage and times per day)? Has not started  3. Are you having a reaction (difficulty breathing--STAT)? no  4. What is your medication issue? Patient's wife states the pharmacy said their insurance would not cover the medication. She would like to know if there is another medication he can take.

## 2020-02-19 NOTE — Telephone Encounter (Signed)
Spoke with patient's wife. Patient's pharmacy contacted patient and stated Benicar is not covered by their insurance plan and recommended MD make changes. They do not know how expensive it is without insurance. Will route to MD and RN to assess medication changes. In the meantime patient is continuing his previous blood pressure medication, Lisinopril.

## 2020-02-20 ENCOUNTER — Other Ambulatory Visit: Payer: Self-pay

## 2020-02-20 DIAGNOSIS — Z79899 Other long term (current) drug therapy: Secondary | ICD-10-CM

## 2020-02-21 ENCOUNTER — Telehealth: Payer: Self-pay | Admitting: Cardiovascular Disease

## 2020-02-21 MED ORDER — VALSARTAN 80 MG PO TABS: 80.0000 mg | ORAL_TABLET | Freq: Every day | ORAL | 3 refills | Status: DC

## 2020-02-21 NOTE — Telephone Encounter (Signed)
Called pt to convey Dr. Evette Georges orders: olmesartan discontinued due to lack of insurance coverage. Valsartan 80 mg by mouth once a day ordered in its place. The patient verbalizes understanding and agreement with plan. Rx sent to new pharmacy per pt request.

## 2020-02-21 NOTE — Telephone Encounter (Signed)
Returned call to [place] with [facility], Dr. Ebony Hail office.    Requesting recent labs faxed to [phone].   Recent labs 12/2 by Dr. Baird Cancer to # requested (patient's PCP listed)

## 2020-02-21 NOTE — Telephone Encounter (Signed)
  Wife is calling back to follow up. She states he only has 2 pills left at this time. They also are changing pharmacies to [facility] in [place].

## 2020-02-21 NOTE — Telephone Encounter (Signed)
Morey Hummingbird from [facility] requesting the patient's recent lab results Fax: [phone]

## 2020-02-26 ENCOUNTER — Telehealth: Payer: Self-pay | Admitting: Cardiovascular Disease

## 2020-02-26 MED ORDER — METOPROLOL TARTRATE 25 MG PO TABS
25.0000 mg | ORAL_TABLET | Freq: Two times a day (BID) | ORAL | 3 refills | Status: DC
Start: 2020-02-26 — End: 2020-06-17

## 2020-02-26 NOTE — Telephone Encounter (Signed)
*  STAT* If patient is at the pharmacy, call can be transferred to refill team.   1. Which medications need to be refilled? (please list name of each medication and dose if known)  metoprolol tartrate (LOPRESSOR) 25 MG tablet   2. Which pharmacy/location (including street and city if local pharmacy) is medication to be sent to? [facility] - [place], [place] - [place] [address]  3. Do they need a 30 day or 90 day supply? 90 day

## 2020-03-14 DIAGNOSIS — I10 Essential (primary) hypertension: Secondary | ICD-10-CM | POA: Diagnosis not present

## 2020-03-14 DIAGNOSIS — I480 Paroxysmal atrial fibrillation: Secondary | ICD-10-CM | POA: Diagnosis not present

## 2020-03-14 DIAGNOSIS — E785 Hyperlipidemia, unspecified: Secondary | ICD-10-CM | POA: Diagnosis not present

## 2020-03-14 DIAGNOSIS — N400 Enlarged prostate without lower urinary tract symptoms: Secondary | ICD-10-CM | POA: Diagnosis not present

## 2020-03-19 ENCOUNTER — Other Ambulatory Visit: Payer: Self-pay | Admitting: Cardiovascular Disease

## 2020-03-20 NOTE — Telephone Encounter (Signed)
Prescription refill request for Xarelto received.  Indication:atrial fibrillation Last office visit:12/21 kell Weight:96.3 kg Age:75 Scr:0.9 12/21 CrCl:101.6 ml/min  Prescription refilled

## 2020-04-03 ENCOUNTER — Other Ambulatory Visit: Payer: Self-pay

## 2020-04-03 DIAGNOSIS — Z79899 Other long term (current) drug therapy: Secondary | ICD-10-CM | POA: Diagnosis not present

## 2020-04-04 LAB — HEPATIC FUNCTION PANEL
ALT: 44 IU/L (ref 0–44)
AST: 30 IU/L (ref 0–40)
Albumin: 4.3 g/dL (ref 3.7–4.7)
Alkaline Phosphatase: 55 IU/L (ref 44–121)
Bilirubin Total: 0.6 mg/dL (ref 0.0–1.2)
Bilirubin, Direct: 0.2 mg/dL (ref 0.00–0.40)
Total Protein: 6.6 g/dL (ref 6.0–8.5)

## 2020-05-13 DIAGNOSIS — I480 Paroxysmal atrial fibrillation: Secondary | ICD-10-CM | POA: Diagnosis not present

## 2020-05-13 DIAGNOSIS — I10 Essential (primary) hypertension: Secondary | ICD-10-CM | POA: Diagnosis not present

## 2020-05-13 DIAGNOSIS — N400 Enlarged prostate without lower urinary tract symptoms: Secondary | ICD-10-CM | POA: Diagnosis not present

## 2020-05-13 DIAGNOSIS — E785 Hyperlipidemia, unspecified: Secondary | ICD-10-CM | POA: Diagnosis not present

## 2020-06-12 DIAGNOSIS — I10 Essential (primary) hypertension: Secondary | ICD-10-CM | POA: Diagnosis not present

## 2020-06-12 DIAGNOSIS — E785 Hyperlipidemia, unspecified: Secondary | ICD-10-CM | POA: Diagnosis not present

## 2020-06-12 DIAGNOSIS — N400 Enlarged prostate without lower urinary tract symptoms: Secondary | ICD-10-CM | POA: Diagnosis not present

## 2020-06-12 DIAGNOSIS — I480 Paroxysmal atrial fibrillation: Secondary | ICD-10-CM | POA: Diagnosis not present

## 2020-06-17 ENCOUNTER — Telehealth: Payer: Self-pay | Admitting: *Deleted

## 2020-06-17 ENCOUNTER — Encounter: Payer: Self-pay | Admitting: Cardiovascular Disease

## 2020-06-17 ENCOUNTER — Ambulatory Visit (INDEPENDENT_AMBULATORY_CARE_PROVIDER_SITE_OTHER): Payer: Medicare Other | Admitting: Cardiovascular Disease

## 2020-06-17 ENCOUNTER — Other Ambulatory Visit: Payer: Self-pay

## 2020-06-17 ENCOUNTER — Other Ambulatory Visit: Payer: Self-pay | Admitting: Cardiovascular Disease

## 2020-06-17 VITALS — BP 120/80 | HR 64 | Wt 215.0 lb

## 2020-06-17 DIAGNOSIS — I10 Essential (primary) hypertension: Secondary | ICD-10-CM | POA: Diagnosis not present

## 2020-06-17 DIAGNOSIS — E785 Hyperlipidemia, unspecified: Secondary | ICD-10-CM

## 2020-06-17 DIAGNOSIS — I480 Paroxysmal atrial fibrillation: Secondary | ICD-10-CM

## 2020-06-17 DIAGNOSIS — I2510 Atherosclerotic heart disease of native coronary artery without angina pectoris: Secondary | ICD-10-CM

## 2020-06-17 DIAGNOSIS — Z7901 Long term (current) use of anticoagulants: Secondary | ICD-10-CM

## 2020-06-17 DIAGNOSIS — I4891 Unspecified atrial fibrillation: Secondary | ICD-10-CM

## 2020-06-17 DIAGNOSIS — G4733 Obstructive sleep apnea (adult) (pediatric): Secondary | ICD-10-CM | POA: Diagnosis not present

## 2020-06-17 MED ORDER — METOPROLOL TARTRATE 25 MG PO TABS
ORAL_TABLET | ORAL | 1 refills | Status: DC
Start: 2020-06-17 — End: 2021-01-28

## 2020-06-17 NOTE — Progress Notes (Signed)
Patient ID: [name], male   DOB: Sep 05, 1945, 76 y.o.   MRN: [id number]    HPI:  [name] is a 75 year old male who is a former patient of Dr.Richard Rollene [name]. 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  He has established CAD and in 1999 underwent a self-expanding radius stent to his LAD. His last nuclear perfusion study was in May 2013 which was low risk and without ischemia an echo Doppler study showed an ejection fraction in the 55-60% range. He had mild LA dilatation. The patient has a history of sick sinus syndrome with paroxysmal atrial fibrillation and remotely had been on amiodarone but developed skin toxicity secondary to do that secondary to this. Apparently he has undergone 2 radiofrequency ablations at [facility] by Dr. Ola [name] initially in February 2009 and he required a second procedure in December 2009 for recurrent atrial fibrillation. He had developed a recurrent episode of atrial fibrillation several years ago at which time he underwent cardioversion and has been maintaining sinus rhythm with a rate coagulation therapy.  He last saw Dr. Rollene [name] in March 2014 at which time he was felt to be stable on his current medical regimen which consisted of Lopressor 25 twice a day, multaq 400 twice a day,  Xarelto 20 mg daily in addition to his atorvastatin 20 mg and fish oil for hyperlipidemia.   In 2015 he underwent colonoscopy.  He [name] his Xarelto for the procedure.  He was found of 6 polyps and will require repeat colonoscopy in 3 years.  When I saw him in 2017 he had experienced one episode of atrial fibrillation several weeks prior.  This occurred after working to significant exhaustion when he was straining, lifting sheet rock and remodeling a bathroom.     I saw him in November 2018 at which time he denied  any awareness of recurrent palpitations or atrial fibrillation.  He is retired from [employer] and has a part-time job as a Clinical biochemist.  However, he states he often is working at least 50 hours per week in this "part-time "position.  At that time he was on atorvastatin for hyperlipidemia, lisinopril 5 mg and metoprolol tartrate 25 mg twice a day for hypertension and PAF in addition to Xarelto anticoagulation.  Laboratory by Dr. Ann [name].  on 11/27/2016:  total cholesterol was 142, HDL 44, LDL 79, triglycerides 94.  Renal function was stable with a creatinine of 0.97.  LFTs were normal.  TSH was 1.3.    I saw him in January 2020 at which time he had  remained stable.  However  2 weeks prior to that evaluation he began to notice some URI type symptoms. While getting ready to do a church service he felt his heart become irregular and he believes he was in atrial fibrillation for 28 hours duration with a rate averaging around 110 bpm.  He did not take any extra metoprolol.  Ultimately he reverted back to sinus rhythm.  He later saw his primary physician and was diagnosed with bronchitis and received antibiotic therapy in addition to Spiriva and cough medication.  When I saw him, he denied any recurrent episode of atrial fibrillation since that episode and he denied any chest pressure.  He continued to be on Xarelto.  I last saw him in November  2020.  At that time he admitted that he continued to be stable and he denied any episodes of chest pain, exertional dyspnea or recurrent arrhythmia.  He continued to be on lisinopril 10 mg daily and metoprolol 25 mg twice a day in addition to Xarelto 20 mg daily.  He was on atorvastatin 40 mg for hyperlipidemia and continues to take over-the-counter fish oil.  He is on aspirin.  He was walking at least 5 days/week.     I last saw him in December 202.  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  He states his hours are 50 to 60 hours/week.  At that time he was having a significant cough which I felt was most likely due to lisinopril and as result I discontinued ACE inhibition and initiated valsartan 80 mg daily.  His cough has improved with this medication adjustment.  He has not been very successful with weight loss.  His only, he has been having difficulty with sleeping.  He typically goes to bed between 930 and 10 approximately 6:30 AM.  He has nocturia 1 time per night and he does snore.  Last week he believes he had gone back into atrial fibrillation on Thursday evening and ultimately converted back to sinus rhythm after approximately 3 days on Monday.  He has experienced some ankle swelling when he has gone into atrial fibrillation.  He continues to be on Xarelto.  He presents for reevaluation.    Past Medical History:  Diagnosis Date  . Atrial fibrillation ([place])   . CAD (coronary artery disease)    LAD stent in 1999  . Hyperlipidemia   . Hypertension   . PAF (paroxysmal atrial fibrillation) ([place])    cardioversion in 2013  . SSS (sick sinus syndrome) [facility])     Past Surgical History:  Procedure Laterality Date  . CARDIAC ELECTROPHYSIOLOGY STUDY AND ABLATION  2008, 2009   RF ablation (x4) [facility] - Dr. Ola [name])  . CARDIOVERSION  06/15/2011   Procedure: CARDIOVERSION;  Surgeon: Timothy Casino, MD;  Location: [place];  Service: Cardiovascular;  Laterality: N/A;  . CORONARY ANGIOPLASTY WITH STENT PLACEMENT  10/29/1997   .0x20 self-expanding radius stent to LAD (Dr. Marella [name])  . NM MYOCAR PERF WALL MOTION  07/2011   bruce myoview; partially fixed apical defect, worse at rest than stress; fixed inferobasal diaphragmatic attenuation artifact; no reversible ischemia; post-stress EF 60%; short run of NSVT; low risk scan   . TRANSTHORACIC ECHOCARDIOGRAM  06/13/2011   EF 55-60%, mod conc LVH; LA mildly dilated    Allergies  Allergen Reactions  .  Nitroglycerin Other (See Comments)    "Blood Pressure goes to zero"    Current Outpatient Medications  Medication Sig Dispense Refill  . aspirin 81 MG chewable tablet Chew 81 mg by mouth daily.    Marland Kitchen atorvastatin (LIPITOR) 40 MG tablet TAKE 1 TABLET BY MOUTH ONCE DAILY 6 IN THE EVENING 90 tablet 3  . Cholecalciferol (VITAMIN D3) 1.25 MG (50000 UT) TABS Take by mouth. Patient taking 1000 units    . esomeprazole (NEXIUM) 20 MG capsule Take 20 mg by mouth daily at 12 noon.    . Garlic 4827 MG CAPS Take 1 capsule by mouth daily.    . Omega-3 Fatty Acids (OMEGA 3 PO) Take 1,000 mg by mouth.     . valsartan (DIOVAN) 80 MG tablet Take 1 tablet (80 mg total) by mouth daily. 90 tablet 3  .  XARELTO 20 MG TABS tablet TAKE ONE TABLET BY MOUTH EVERY EVENING 60 tablet 5  . metoprolol tartrate (LOPRESSOR) 25 MG tablet Take 2 tablets (50 mg) in the morning, and 1 tablet (25 mg) in the evening. 360 tablet 1   No current facility-administered medications for this visit.    Social history is notable in that he is married for 48 years. He has 3 children 10 grandchildren. 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  Family History  Problem Relation Age of Onset  . Coronary artery disease Father        MI  . Hypertension Father   . Diabetes Mother   . Hypertension Sister   . Atrial fibrillation Child   . Hypertension Child    ROS General: Negative; No fevers, chills, or night sweats; positive for weight gain HEENT: Remote history of photosensitivity when he was on amiodarone; No changes in vision or hearing, sinus congestion, difficulty swallowing Pulmonary: Recent bronchitis with mild wheezing Cardiovascular: See history of present illness; No chest pain, presyncope, syncope, palpitations GI: Negative; No nausea, vomiting, diarrhea, or abdominal pain GU: Negative; No dysuria, hematuria, or difficulty voiding Musculoskeletal: Negative; no myalgias, joint pain, or weakness Hematologic/Oncology:  Negative; no easy bruising, bleeding Endocrine: Negative; no heat/cold intolerance; no diabetes Neuro: Negative; no changes in balance, headaches Skin: Negative; No rashes or skin lesions Psychiatric: Negative; No behavioral problems, depression Sleep: Positive for snoring, nodaytime sleepiness, hypersomnolence, bruxism, restless legs, hypnogognic hallucinations, no cataplexy Other comprehensive 14 point system review is negative.   PE BP 120/80 (BP Location: Left Arm)   Pulse 64   Wt 215 lb (97.5 kg)   SpO2 96%   BMI 34.70 kg/m    Repeat blood pressure by me was 142/86  Wt Readings from Last 3 Encounters:  06/17/20 215 lb (97.5 kg)  02/15/20 212 lb 3.2 oz (96.3 kg)  01/24/19 191 lb 3.2 oz (86.7 kg)   General: Alert, oriented, no distress.  Skin: normal turgor, no rashes, warm and dry HEENT: Normocephalic, atraumatic. Pupils equal round and reactive to light; sclera anicteric; extraocular muscles intact;  Nose without nasal septal hypertrophy Mouth/Parynx benign; Mallinpatti scale 4 Neck: No JVD, no carotid bruits; normal carotid upstroke Lungs: clear to ausculatation and percussion; no wheezing or rales Chest wall: without tenderness to palpitation Heart: PMI not displaced, RRR, s1 s2 normal, 1/6 systolic murmur, no diastolic murmur, no rubs, gallops, thrills, or heaves Abdomen: soft, nontender; no hepatosplenomehaly, BS+; abdominal aorta nontender and not dilated by palpation. Back: no CVA tenderness Pulses 2+ Musculoskeletal: full range of motion, normal strength, no joint deformities Extremities: no clubbing cyanosis or edema, Homan's sign negative  Neurologic: grossly nonfocal; Cranial nerves grossly wnl Psychologic: Normal mood and affect   ECG (independently read by me): Normal sinus rhythm at 64 bpm, PVC, QTc interval 449 ms.  December 2021ECG (independently read by me): NSR at 62, normal intervals  January 24, 2019 ECG (independently read by me): Sinus  bradycardia 55 bpm.  No ectopy.  Normal intervals.  January 2020 ECG (independently read by me): Sinus bradycardia 55 bpm.  Normal intervals.  No ectopy.  November 2018 ECG (independently read by me): Sinus bradycardia 52 bpm.  Normal intervals.  No ST segment changes.  October 2017 ECG (independently read by me): Sinus rhythm at 59 bpm.  No significant ST-T changes.  QTc interval 461 ms.  August 2015 ECG (independently read by me): Sinus bradycardia 57 beats per minute.  QTc interval 455 ms.  January  2015 ECG (independently read by me):  Sinus bradycardia 54 beats per minute. Normal intervals. No ectopy.  Prior ECG of 01/05/2013: normal sinus rhythm at 62 beats per minute; PR interval 146 ms; QTc interval 468 ms.   LABS: BMP Latest Ref Rng & Units 02/15/2020 04/30/2017 11/22/2014  Glucose 65 - 99 mg/dL 110(H) 106(H) 104(H)  BUN 8 - 27 mg/dL $Remove'15 15 15  'mRsnCCM$ Creatinine 0.76 - 1.27 mg/dL 0.90 0.79 1.01  BUN/Creat Ratio 10 - $Re'24 17 19 'fHX$ -  Sodium 134 - 144 mmol/L 140 138 139  Potassium 3.5 - 5.2 mmol/L 4.2 4.3 4.6  Chloride 96 - 106 mmol/L 104 105 105  CO2 20 - 29 mmol/L $RemoveB'22 22 24  'tdyunsjm$ Calcium 8.6 - 10.2 mg/dL 9.0 9.0 8.9   Hepatic Function Latest Ref Rng & Units 04/03/2020 02/15/2020 04/30/2017  Total Protein 6.0 - 8.5 g/dL 6.6 6.5 6.5  Albumin 3.7 - 4.7 g/dL 4.3 4.3 4.3  AST 0 - 40 IU/L 30 37 23  ALT 0 - 44 IU/L 44 58(H) 32  Alk Phosphatase 44 - 121 IU/L 55 63 58  Total Bilirubin 0.0 - 1.2 mg/dL 0.6 0.9 0.8  Bilirubin, Direct 0.00 - 0.40 mg/dL 0.20 - -   CBC Latest Ref Rng & Units 02/15/2020 11/22/2014 08/31/2013  WBC 3.4 - 10.8 x10E3/uL 7.3 7.7 6.9  Hemoglobin 13.0 - 17.7 g/dL 14.2 14.4 14.0  Hematocrit 37.5 - 51.0 % 40.2 42.0 40.3  Platelets 150 - 450 x10E3/uL 240 258 235   Lab Results  Component Value Date   MCV 89 02/15/2020   MCV 91.5 11/22/2014   MCV 89.8 08/31/2013   Lab Results  Component Value Date   TSH 1.690 02/15/2020   Lipid Panel     Component Value Date/Time   CHOL 131  02/15/2020 0935   TRIG 120 02/15/2020 0935   HDL 47 02/15/2020 0935   CHOLHDL 2.8 02/15/2020 0935   CHOLHDL 2.9 11/22/2014 0856   VLDL 29 11/22/2014 0856   LDLCALC 63 02/15/2020 0935   RADIOLOGY: No results found.  IMPRESSION: 1. PAF (paroxysmal atrial fibrillation) ([place])   2. Essential hypertension   3. Coronary artery disease involving native coronary artery of native heart without angina pectoris   4. Hyperlipidemia with target LDL less than 70   5. Anticoagulation adequate   6. Evaluate for OSA (obstructive sleep apnea)      ASSESSMENT AND PLAN: [name] is a 75 year-old white male who has established CAD and underwent insertion of a self-expanding Radius stent to his LAD in 1999.  His last nuclear study in May 2013 was low risk without ischemia.  He has a history of sick sinus syndrome with paroxysmal atrial fibrillation and developed amiodarone skin toxicity.  He is status post 2 radiofrequency ablations.  When I saw him in January 2020 he had experienced one episode of recurrent atrial fibrillation.  He ha been without any recurrent episodes of atrial fibrillation for greater than 2 years but 2 weeks previous to that evaluation had experienced an episode of AF which he believes lasted from 11 AM on Sunday until 3 PM on Monday with an average ventricular rate at 110 bpm.  Similarly, last week he again experienced an episode of atrial fibrillation in the evening on Thursday night and ultimately converted back to sinus rhythm 3 days later.  Presently, I have recommended titration of metoprolol to 50 mg in the morning and 25 mg at night.  I am concerned that  he may have obstructive sleep apnea which may potentially be contributing to his recurrent AF episodes.  On exam his Mallampati scale is a 4.  He admits to snoring.  I have recommended he undergo a sleep study for further evaluation.  I am also scheduling him for follow-up echo Doppler study for reassessment of LV systolic and  diastolic function.  His blood pressure today is stable and his cough resolved with discontinuance of lisinopril and he continues to be on valsartan 80 mg daily.  He is on atorvastatin 40 mg for hyperlipidemia and takes baby aspirin in addition to his Xarelto for CAD.    Troy Sine, MD, [facility] 06/20/2020 6:20 PM

## 2020-06-17 NOTE — Patient Instructions (Signed)
Medication Instructions:  Increase Metoprolol Tartrate to 50 mg in the morning, and 25 mg in the evening.  *If you need a refill on your cardiac medications before your next appointment, please call your pharmacy*   Testing/Procedures: Echocardiogram - Your physician has requested that you have an echocardiogram. Echocardiography is a painless test that uses sound waves to create images of your heart. It provides your doctor with information about the size and shape of your heart and how well your heart's chambers and valves are working. This procedure takes approximately one hour. There are no restrictions for this procedure. This will be performed at our [facility] location - [address], [address].  Your physician has recommended that you have a sleep study. This test records several body functions during sleep, including: brain activity, eye movement, oxygen and carbon dioxide blood levels, heart rate and rhythm, breathing rate and rhythm, the flow of air through your mouth and nose, snoring, body muscle movements, and chest and belly movement.    Follow-Up: At [facility], you and your health needs are our priority.  As part of our continuing mission to provide you with exceptional heart care, we have created designated Provider Care Teams.  These Care Teams include your primary Cardiologist (physician) and Advanced Practice Providers (APPs -  Physician Assistants and Nurse Practitioners) who all work together to provide you with the care you need, when you need it.  We recommend signing up for the patient portal called "MyChart".  Sign up information is provided on this After Visit Summary.  MyChart is used to connect with patients for Virtual Visits (Telemedicine).  Patients are able to view lab/test results, encounter notes, upcoming appointments, etc.  Non-urgent messages can be sent to your provider as well.   To learn more about what you can do with MyChart, go to  [email or web address].    Your next appointment:   3 month(s)  The format for your next appointment:   In Person  Provider:   You may see Shelva Majestic, MD or one of the following Advanced Practice Providers on your designated Care Team:    Almyra Deforest, PA-C  Fabian Sharp, PA-C or   Roby Lofts, [place]

## 2020-06-17 NOTE — Telephone Encounter (Signed)
Patient notified of June 1 sleep study appointment.

## 2020-06-20 ENCOUNTER — Encounter: Payer: Self-pay | Admitting: Cardiovascular Disease

## 2020-07-17 ENCOUNTER — Other Ambulatory Visit: Payer: Self-pay

## 2020-07-17 ENCOUNTER — Ambulatory Visit (HOSPITAL_COMMUNITY): Payer: Medicare Other | Attending: Cardiology

## 2020-07-17 DIAGNOSIS — I480 Paroxysmal atrial fibrillation: Secondary | ICD-10-CM | POA: Insufficient documentation

## 2020-07-17 DIAGNOSIS — I10 Essential (primary) hypertension: Secondary | ICD-10-CM | POA: Diagnosis not present

## 2020-07-17 LAB — ECHOCARDIOGRAM COMPLETE
Area-P 1/2: 3.2 cm2
P 1/2 time: 626 msec
S' Lateral: 3.6 cm

## 2020-07-25 ENCOUNTER — Telehealth: Payer: Self-pay | Admitting: Cardiovascular Disease

## 2020-07-25 NOTE — Telephone Encounter (Signed)
Pt updated with ECHO results and verbalized understanding.  

## 2020-07-25 NOTE — Telephone Encounter (Signed)
Follow up:     Patient returning a call concering some results.

## 2020-07-29 DIAGNOSIS — S29012A Strain of muscle and tendon of back wall of thorax, initial encounter: Secondary | ICD-10-CM | POA: Diagnosis not present

## 2020-07-29 DIAGNOSIS — I119 Hypertensive heart disease without heart failure: Secondary | ICD-10-CM | POA: Diagnosis not present

## 2020-07-29 DIAGNOSIS — D6869 Other thrombophilia: Secondary | ICD-10-CM | POA: Diagnosis not present

## 2020-07-29 DIAGNOSIS — I480 Paroxysmal atrial fibrillation: Secondary | ICD-10-CM | POA: Diagnosis not present

## 2020-08-12 DIAGNOSIS — E785 Hyperlipidemia, unspecified: Secondary | ICD-10-CM | POA: Diagnosis not present

## 2020-08-12 DIAGNOSIS — N400 Enlarged prostate without lower urinary tract symptoms: Secondary | ICD-10-CM | POA: Diagnosis not present

## 2020-08-12 DIAGNOSIS — I480 Paroxysmal atrial fibrillation: Secondary | ICD-10-CM | POA: Diagnosis not present

## 2020-08-12 DIAGNOSIS — I10 Essential (primary) hypertension: Secondary | ICD-10-CM | POA: Diagnosis not present

## 2020-08-14 ENCOUNTER — Ambulatory Visit (HOSPITAL_BASED_OUTPATIENT_CLINIC_OR_DEPARTMENT_OTHER): Payer: Medicare Other | Attending: Cardiovascular Disease | Admitting: Cardiovascular Disease

## 2020-08-14 ENCOUNTER — Other Ambulatory Visit: Payer: Self-pay

## 2020-08-14 DIAGNOSIS — I10 Essential (primary) hypertension: Secondary | ICD-10-CM | POA: Insufficient documentation

## 2020-08-14 DIAGNOSIS — G4733 Obstructive sleep apnea (adult) (pediatric): Secondary | ICD-10-CM | POA: Diagnosis not present

## 2020-08-14 DIAGNOSIS — I480 Paroxysmal atrial fibrillation: Secondary | ICD-10-CM | POA: Insufficient documentation

## 2020-09-02 DIAGNOSIS — J209 Acute bronchitis, unspecified: Secondary | ICD-10-CM | POA: Diagnosis not present

## 2020-09-06 ENCOUNTER — Encounter (HOSPITAL_BASED_OUTPATIENT_CLINIC_OR_DEPARTMENT_OTHER): Payer: Self-pay | Admitting: Cardiovascular Disease

## 2020-09-06 NOTE — Procedures (Signed)
Patient Name: Timothy [name], Timothy [name] Date: 08/14/2020 Gender: Male D.O.B: 1945-04-08 Age (years): 74 Referring Provider: Shelva Majestic MD, ABSM Height (inches): 66 Interpreting Physician: Shelva Majestic MD, ABSM Weight (lbs): 210 RPSGT: Carolin Coy BMI: 34 MRN: [id number] Neck Size: 17.50  CLINICAL INFORMATION Sleep Study Type: Split Night CPAP  Indication for sleep study: Fatigue, Hypertension, Obesity, Snoring  Epworth Sleepiness Score: 2  SLEEP STUDY TECHNIQUE As per the AASM Manual for the Scoring of Sleep and Associated Events v2.3 (April 2016) with a hypopnea requiring 4% desaturations.  The channels recorded and monitored were frontal, central and occipital EEG, electrooculogram (EOG), submentalis EMG (chin), nasal and oral airflow, thoracic and abdominal wall motion, anterior tibialis EMG, snore microphone, electrocardiogram, and pulse oximetry. Continuous positive airway pressure (CPAP) was initiated when the patient met split night criteria and was titrated according to treat sleep-disordered breathing.  MEDICATIONS aspirin 81 MG chewable tablet atorvastatin (LIPITOR) 40 MG tablet Cholecalciferol (VITAMIN D3) 1.25 MG (50000 UT) TABS esomeprazole (NEXIUM) 20 MG capsule Garlic 0865 MG CAPS metoprolol tartrate (LOPRESSOR) 25 MG tablet Omega-3 Fatty Acids (OMEGA 3 PO) valsartan (DIOVAN) 80 MG tablet XARELTO 20 MG TABS tablet Medications self-administered by patient taken the night of the study : N/A  RESPIRATORY PARAMETERS Diagnostic  Total AHI (/hr): 13.7 RDI (/hr): 97.2 OA Index (/hr): 4 CA Index (/hr): 0.4 REM AHI (/hr): N/A NREM AHI (/hr): 13.7 Supine AHI (/hr): 0.0 Non-supine AHI (/hr): 14.3 Min O2 Sat (%): 90.0 Mean O2 (%): 94.7 Time below 88% (min): 0  Titration Optimal Pressure (cm): 16 AHI at Optimal Pressure (/hr): 0 Min O2 at Optimal Pressure (%): 94.0 Supine % at Optimal (%): 100 Sleep % at Optimal (%): 98   SLEEP ARCHITECTURE The recording  time for the entire night was 370.4 minutes.  During a baseline period of 200.0 minutes, the patient slept for 166.7 minutes in REM and nonREM, yielding a sleep efficiency of 83.3%%. Sleep onset after lights out was 9.8 minutes with a REM latency of N/A minutes. The patient spent 20.7%% of the night in stage N1 sleep, 79.3%% in stage N2 sleep, 0.0%% in stage N3 and 0% in REM.  During the titration period of 165.8 minutes, the patient slept for 135.5 minutes in REM and nonREM, yielding a sleep efficiency of 81.7%%. Sleep onset after CPAP initiation was 29.8 minutes with a REM latency of 8.5 minutes. The patient spent 3.0%% of the night in stage N1 sleep, 68.3%% in stage N2 sleep, 0.0%% in stage N3 and 28.8% in REM.  CARDIAC DATA The 2 lead EKG demonstrated sinus rhythm. The mean heart rate was 100.0 beats per minute. Other EKG findings include: PVCs.  LEG MOVEMENT DATA The total Periodic Limb Movements of Sleep (PLMS) were 0. The PLMS index was 0.0 .  IMPRESSIONS - Mild obstructive sleep apnea overall during the diagnostic portion of the study (AHI  13.7 /h; however, RDI 92.2/h) with absent REM sleep. CPAP was initiated at 6 cm and was titrated to optimal PAP pressure of 16 cm of water. (AHI  0; RDI 2.1/h; O2 nadir 94%).  Probable REM rebound (28.8%) with CPAP therapy. - No significant central sleep apnea occurred during the diagnostic portion of the study (CAI = 0.4/hour). - The patient had minimal or no oxygen desaturation during the diagnostic portion of the study (Min O2 = 90.0%) - The patient snored with moderate snoring volume during the diagnostic portion of the study. - EKG findings include PVCs. - Clinically significant  periodic limb movements did not occur during sleep.  DIAGNOSIS - Obstructive Sleep Apnea (G47.33)  RECOMMENDATIONS - Recommend an initial trial of CPAP therapy with EPR at 16 cm H2O with heated humidification.  A Medium size Fisher&Paykel Full Face Mask Simplus mask  was used for the titration. - Effort should be made to optimize nasal and oropharyngeal patency. - Avoid alcohol, sedatives and other CNS depressants that may worsen sleep apnea and disrupt normal sleep architecture. - Sleep hygiene should be reviewed to assess factors that may improve sleep quality. - Weight management and regular exercise should be initiated or continued. - Recommend a download in 30 days and sleep Center evaluation after 4 weeks of therapy.  [Electronically signed] 09/06/2020 03:10 PM  Shelva Majestic MD, [facility], ABSM Diplomate, American Board of Sleep Medicine   NPI: [id number]  [place] PH: [phone]   FX: [phone] [place]

## 2020-09-11 DIAGNOSIS — Z6835 Body mass index (BMI) 35.0-35.9, adult: Secondary | ICD-10-CM | POA: Diagnosis not present

## 2020-09-11 DIAGNOSIS — J040 Acute laryngitis: Secondary | ICD-10-CM | POA: Diagnosis not present

## 2020-09-11 DIAGNOSIS — I119 Hypertensive heart disease without heart failure: Secondary | ICD-10-CM | POA: Diagnosis not present

## 2020-10-07 ENCOUNTER — Other Ambulatory Visit: Payer: Self-pay

## 2020-10-07 ENCOUNTER — Ambulatory Visit (INDEPENDENT_AMBULATORY_CARE_PROVIDER_SITE_OTHER): Payer: Medicare Other | Admitting: Cardiovascular Disease

## 2020-10-07 ENCOUNTER — Encounter: Payer: Self-pay | Admitting: Cardiovascular Disease

## 2020-10-07 DIAGNOSIS — Z7901 Long term (current) use of anticoagulants: Secondary | ICD-10-CM

## 2020-10-07 DIAGNOSIS — I2510 Atherosclerotic heart disease of native coronary artery without angina pectoris: Secondary | ICD-10-CM | POA: Diagnosis not present

## 2020-10-07 DIAGNOSIS — E669 Obesity, unspecified: Secondary | ICD-10-CM | POA: Diagnosis not present

## 2020-10-07 DIAGNOSIS — I480 Paroxysmal atrial fibrillation: Secondary | ICD-10-CM | POA: Diagnosis not present

## 2020-10-07 DIAGNOSIS — E785 Hyperlipidemia, unspecified: Secondary | ICD-10-CM

## 2020-10-07 DIAGNOSIS — I10 Essential (primary) hypertension: Secondary | ICD-10-CM

## 2020-10-07 DIAGNOSIS — G4733 Obstructive sleep apnea (adult) (pediatric): Secondary | ICD-10-CM | POA: Diagnosis not present

## 2020-10-07 NOTE — Progress Notes (Signed)
Patient ID: Timothy [name], male   DOB: 05/10/1945, 75 y.o.   MRN: [id number]    HPI:  [name] is a 75 year old male who is a former patient of Dr.Richard Rollene [name]. 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  He has established CAD and in 1999 underwent a self-expanding radius stent to his LAD. His last nuclear perfusion study was in May 2013 which was low risk and without ischemia an echo Doppler study showed an ejection fraction in the 55-60% range. He had mild LA dilatation. The patient has a history of sick sinus syndrome with paroxysmal atrial fibrillation and remotely had been on amiodarone but developed skin toxicity secondary to do that secondary to this. Apparently he has undergone 2 radiofrequency ablations at [facility] by Dr. Ola [name] initially in February 2009 and he required a second procedure in December 2009 for recurrent atrial fibrillation. He had developed a recurrent episode of atrial fibrillation several years ago at which time he underwent cardioversion and has been maintaining sinus rhythm with a rate coagulation therapy.  He last saw Dr. Rollene [name] in March 2014 at which time he was felt to be stable on his current medical regimen which consisted of Lopressor 25 twice a day, multaq 400 twice a day,  Xarelto 20 mg daily in addition to his atorvastatin 20 mg and fish oil for hyperlipidemia.   In 2015 he underwent colonoscopy.  He [name] his Xarelto for the procedure.  He was found of 6 polyps and will require repeat colonoscopy in 3 years.  When I saw him in 2017 he had experienced one episode of atrial fibrillation several weeks prior.  This occurred after working to significant exhaustion when he was straining, lifting sheet rock and remodeling a bathroom.     I saw him in November 2018 at which time he denied  any awareness of recurrent palpitations or atrial fibrillation.  He is retired from [employer] and has a part-time job as a Clinical biochemist.  However, he states he often is working at least 50 hours per week in this "part-time "position.  At that time he was on atorvastatin for hyperlipidemia, lisinopril 5 mg and metoprolol tartrate 25 mg twice a day for hypertension and PAF in addition to Xarelto anticoagulation.  Laboratory by Dr. Ann [name].  on 11/27/2016:  total cholesterol was 142, HDL 44, LDL 79, triglycerides 94.  Renal function was stable with a creatinine of 0.97.  LFTs were normal.  TSH was 1.3.    I saw him in January 2020 at which time he had  remained stable.  However  2 weeks prior to that evaluation he began to notice some URI type symptoms. While getting ready to do a church service he felt his heart become irregular and he believes he was in atrial fibrillation for 28 hours duration with a rate averaging around 110 bpm.  He did not take any extra metoprolol.  Ultimately he reverted back to sinus rhythm.  He later saw his primary physician and was diagnosed with bronchitis and received antibiotic therapy in addition to Spiriva and cough medication.  When I saw him, he denied any recurrent episode of atrial fibrillation since that episode and he denied any chest pressure.  He continued to be on Xarelto.  When I saw him in November  2020 he admitted that he continued to be stable and he denied any episodes of chest pain, exertional dyspnea or recurrent arrhythmia.  He continued to be on lisinopril 10 mg daily and metoprolol 25 mg twice a day in addition to Xarelto 20 mg daily.  He was on atorvastatin 40 mg for hyperlipidemia and continues to take over-the-counter fish oil.  He is on aspirin.  He was walking at least 5 days/week.    I saw him in December 2021.  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   He states his hours are 50 to 60 hours/week.  At that time he was having a significant cough which I felt was most likely due to lisinopril and as result I discontinued ACE inhibition and initiated valsartan 80 mg daily.    I last saw him in June 17, 2020.  His cough had improved with his medication adjustment.  He has not been very successful with weight loss.  At that time he was having  difficulty with sleeping.  He typically goes to bed between 9:30 and 10 pm and wakes up at approximately 6:30 AM.  He has nocturia 1 time per night and he does snore.  Last week he believes he had gone back into atrial fibrillation on Thursday evening and ultimately converted back to sinus rhythm after approximately 3 days on Monday.  He has experienced some ankle swelling when he has gone into atrial fibrillation.  He continued to be on Xarelto.  During that evaluation, his ECG showed sinus rhythm at 64 bpm with isolated PVC.  I was concerned that he may have obstructive sleep apnea which may be potentially contributing to his recurrent atrial fibrillation episode.  I recommended he undergo a follow-up echo Doppler study and also recommended a sleep evaluation.  On Jul 17, 2020 an echo Doppler study showed normal systolic function with EF at 60 to 65% with mild concentric LVH, and grade 2 diastolic dysfunction.  This was not significantly changed from his prior echo in January 2020.  He underwent a sleep study on August 14, 2020 and met split-night criteria.  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  CPAP was initiated at 6 cm and was titrated to optimal pressure at 16 cm of water.  He did have occasional PVCs during the study.  Presently, he has not yet been set up with his CPAP device due to supply chain issues.  I will try to have our nurse coordinator contact the DME  company to see where he stands with reference to receiving his equipment.  There had been up to a 4 to 53-month delay.  Presently he is going to bed between 10 and 10:30 at night and waking up at 630.  He denies chest pain.  He continues to be on valsartan 80 mg and metoprolol titrate 50 mg in the morning and 25 mg in the evening for hypertension and his PAF.  He is on atorvastatin 40 mg daily for hyperlipidemia.  He continues to take Xarelto for anticoagulation and is on Nexium for GERD.  He presents for evaluation.  Past Medical History:  Diagnosis Date   Atrial fibrillation ([place])    CAD (coronary artery disease)    LAD stent in 1999   Hyperlipidemia    Hypertension  PAF (paroxysmal atrial fibrillation) ([place])    cardioversion in 2013   SSS (sick sinus syndrome) [facility])     Past Surgical History:  Procedure Laterality Date   CARDIAC ELECTROPHYSIOLOGY STUDY AND ABLATION  2008, 2009   RF ablation (x4) [facility] - Dr. Ola [name])   CARDIOVERSION  06/15/2011   Procedure: CARDIOVERSION;  Surgeon: Pixie Casino, MD;  Location: [facility];  Service: Cardiovascular;  Laterality: N/A;   CORONARY ANGIOPLASTY WITH STENT PLACEMENT  10/29/1997   .0x20 self-expanding radius stent to LAD (Dr. Marella Chimes)   [place]  07/2011   bruce myoview; partially fixed apical defect, worse at rest than stress; fixed inferobasal diaphragmatic attenuation artifact; no reversible ischemia; post-stress EF 60%; short run of NSVT; low risk scan    TRANSTHORACIC ECHOCARDIOGRAM  06/13/2011   EF 55-60%, mod conc LVH; LA mildly dilated    Allergies  Allergen Reactions   Nitroglycerin Other (See Comments)    "Blood Pressure goes to zero"    Current Outpatient Medications  Medication Sig Dispense Refill   aspirin 81 MG chewable tablet Chew 81 mg by mouth daily.     atorvastatin (LIPITOR) 40 MG tablet TAKE 1 TABLET BY MOUTH ONCE DAILY 6 IN THE EVENING 90 tablet 3   Cholecalciferol (VITAMIN D3) 1.25  MG (50000 UT) TABS Take by mouth daily. Patient taking 1000 units     esomeprazole (NEXIUM) 20 MG capsule Take 20 mg by mouth daily at 12 noon.     Garlic 7619 MG CAPS Take 1 capsule by mouth daily.     metoprolol tartrate (LOPRESSOR) 25 MG tablet Take 2 tablets (50 mg) in the morning, and 1 tablet (25 mg) in the evening. 360 tablet 1   Omega-3 Fatty Acids (OMEGA 3 PO) Take 1,000 mg by mouth.      valsartan (DIOVAN) 80 MG tablet Take 1 tablet (80 mg total) by mouth daily. 90 tablet 3   XARELTO 20 MG TABS tablet TAKE ONE TABLET BY MOUTH EVERY EVENING 60 tablet 5   acetaminophen-codeine (TYLENOL #3) 300-30 MG tablet Take 1 tablet by mouth every 4 (four) hours as needed. (Patient not taking: Reported on 10/07/2020)     No current facility-administered medications for this visit.    Social history is notable in that he is married for 48 years. He has 3 children 10 grandchildren. 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  Family History  Problem Relation Age of Onset   Coronary artery disease Father        MI   Hypertension Father    Diabetes Mother    Hypertension Sister    Atrial fibrillation Child    Hypertension Child    ROS General: Negative; No fevers, chills, or night sweats; positive for weight gain HEENT: Remote history of photosensitivity when he was on amiodarone; No changes in vision or hearing, sinus congestion, difficulty swallowing Pulmonary: Recent bronchitis with mild wheezing Cardiovascular: See history of present illness; No chest pain, presyncope, syncope, palpitations GI: Negative; No nausea, vomiting, diarrhea, or abdominal pain GU: Negative; No dysuria, hematuria, or difficulty voiding Musculoskeletal: Negative; no myalgias, joint pain, or weakness Hematologic/Oncology: Negative; no easy bruising, bleeding Endocrine: Negative; no heat/cold intolerance; no diabetes Neuro: Negative; no changes in balance, headaches Skin: Negative; No rashes or skin  lesions Psychiatric: Negative; No behavioral problems, depression Sleep: Positive for snoring, nodaytime sleepiness, hypersomnolence, bruxism, restless legs, hypnogognic hallucinations, no cataplexy Other comprehensive 14 point system review is negative.  PE BP 138/76 (BP Location: Left Arm, Patient Position: Sitting, Cuff Size: Large)   Pulse (!) 57   Ht 5' 6.5" (1.689 m)   Wt 217 lb 3.2 oz (98.5 kg)   SpO2 97%   BMI 34.53 kg/m    Repeat blood pressure by me was 138-140/78  Wt Readings from Last 3 Encounters:  10/07/20 217 lb 3.2 oz (98.5 kg)  08/14/20 210 lb (95.3 kg)  06/17/20 215 lb (97.5 kg)   General: Alert, oriented, no distress.  Skin: normal turgor, no rashes, warm and dry HEENT: Normocephalic, atraumatic. Pupils equal round and reactive to light; sclera anicteric; extraocular muscles intact;  Nose without nasal septal hypertrophy Mouth/Parynx benign; Mallinpatti scale 4 Neck: No JVD, no carotid bruits; normal carotid upstroke Lungs: clear to ausculatation and percussion; no wheezing or rales Chest wall: without tenderness to palpitation Heart: PMI not displaced, RRR, s1 s2 normal, 1/6 systolic murmur, no diastolic murmur, no rubs, gallops, thrills, or heaves Abdomen: soft, nontender; no hepatosplenomehaly, BS+; abdominal aorta nontender and not dilated by palpation. Back: no CVA tenderness Pulses 2+ Musculoskeletal: full range of motion, normal strength, no joint deformities Extremities: no clubbing cyanosis or edema, Homan's sign negative  Neurologic: grossly nonfocal; Cranial nerves grossly wnl Psychologic: Normal mood and affect   July 25, 2022ECG (independently read by me): Sinus Bradycardia at 57; no ectopy, normal intervals  April 2022 ECG (independently read by me): Normal sinus rhythm at 64 bpm, PVC, QTc interval 449 ms.  December 2021 ECG (independently read by me): NSR at 62, normal intervals  January 24, 2019 ECG (independently read by me): Sinus  bradycardia 55 bpm.  No ectopy.  Normal intervals.  January 2020 ECG (independently read by me): Sinus bradycardia 55 bpm.  Normal intervals.  No ectopy.  November 2018 ECG (independently read by me): Sinus bradycardia 52 bpm.  Normal intervals.  No ST segment changes.  October 2017 ECG (independently read by me): Sinus rhythm at 59 bpm.  No significant ST-T changes.  QTc interval 461 ms.  August 2015 ECG (independently read by me): Sinus bradycardia 57 beats per minute.  QTc interval 455 ms.  January  2015 ECG (independently read by me):  Sinus bradycardia 54 beats per minute. Normal intervals. No ectopy.  Prior ECG of 01/05/2013: normal sinus rhythm at 62 beats per minute; PR interval 146 ms; QTc interval 468 ms.   LABS: BMP Latest Ref Rng & Units 02/15/2020 04/30/2017 11/22/2014  Glucose 65 - 99 mg/dL 110(H) 106(H) 104(H)  BUN 8 - 27 mg/dL $Remove'15 15 15  'GpeOggX$ Creatinine 0.76 - 1.27 mg/dL 0.90 0.79 1.01  BUN/Creat Ratio 10 - $Re'24 17 19 'sJZ$ -  Sodium 134 - 144 mmol/L 140 138 139  Potassium 3.5 - 5.2 mmol/L 4.2 4.3 4.6  Chloride 96 - 106 mmol/L 104 105 105  CO2 20 - 29 mmol/L $RemoveB'22 22 24  'cHsRKwdp$ Calcium 8.6 - 10.2 mg/dL 9.0 9.0 8.9   Hepatic Function Latest Ref Rng & Units 04/03/2020 02/15/2020 04/30/2017  Total Protein 6.0 - 8.5 g/dL 6.6 6.5 6.5  Albumin 3.7 - 4.7 g/dL 4.3 4.3 4.3  AST 0 - 40 IU/L 30 37 23  ALT 0 - 44 IU/L 44 58(H) 32  Alk Phosphatase 44 - 121 IU/L 55 63 58  Total Bilirubin 0.0 - 1.2 mg/dL 0.6 0.9 0.8  Bilirubin, Direct 0.00 - 0.40 mg/dL 0.20 - -   CBC Latest Ref Rng & Units 02/15/2020 11/22/2014 08/31/2013  WBC 3.4 - 10.8 x10E3/uL 7.3 7.7  6.9  Hemoglobin 13.0 - 17.7 g/dL 14.2 14.4 14.0  Hematocrit 37.5 - 51.0 % 40.2 42.0 40.3  Platelets 150 - 450 x10E3/uL 240 258 235   Lab Results  Component Value Date   MCV 89 02/15/2020   MCV 91.5 11/22/2014   MCV 89.8 08/31/2013   Lab Results  Component Value Date   TSH 1.690 02/15/2020   Lipid Panel     Component Value Date/Time   CHOL 131  02/15/2020 0935   TRIG 120 02/15/2020 0935   HDL 47 02/15/2020 0935   CHOLHDL 2.8 02/15/2020 0935   CHOLHDL 2.9 11/22/2014 0856   VLDL 29 11/22/2014 0856   LDLCALC 63 02/15/2020 0935   RADIOLOGY: No results found.  IMPRESSION: 1. OSA (obstructive sleep apnea)   2. PAF (paroxysmal atrial fibrillation) ([place])   3. Anticoagulation adequate   4. Essential hypertension   5. Hyperlipidemia with target LDL less than 70   6. Obesity (BMI 30.0-34.9)     ASSESSMENT AND PLAN: Mr. Sinning is a 75 year-old white male who has established CAD and underwent insertion of a self-expanding Radius stent to his LAD in 1999.  His last nuclear study in May 2013 was low risk without ischemia.  He has a history of sick sinus syndrome with paroxysmal atrial fibrillation and developed amiodarone skin toxicity.  He is status post 2 radiofrequency ablations.  When I saw him in January 2020 he had experienced one episode of recurrent atrial fibrillation.  He had been without any recurrent episodes of atrial fibrillation for greater than 2 years experienced an episode of AF which he believes lasted from 11 AM on Sunday until 3 PM on Monday with an average ventricular rate at 110 bpm and  again experienced an episode of atrial fibrillation in the evening on Thursday night and ultimately converted back to sinus rhythm 3 days later at the end of March/early April 2022.  When I saw him on June 17, 2020 I recommended titration of his metoprolol dose to 50 mg in the morning and 25 mg at night.  This has stabilized his heart rhythm.  His ECG G today shows sinus rhythm and he is not aware of any recurrent atrial fibrillation.  Due to concerns for obstructive sleep apnea I referred him for a split-night sleep study.  I reviewed his sleep study with him in detail today.  On the diagnostic portion of the study, he was unable to achieve any REM sleep.  He had overall mild to moderate sleep apnea but his RDI index was significantly  increased in excess of 90.  He was titrated up to 16 cm water pressure with excellent response.  Unfortunately, due to supply chain issues, he has not yet been able to receive a new ResMed air sense 11 CPAP auto unit.  We will contact the DME company to see where this stands with reference to the back order which had been 4 to 6 months.  Presently, his ECG is stable.  Blood pressure is slightly increased in the upper 888B systolically and normal diastolically.  He is on valsartan 80 mg in addition to his metoprolol.  I anticipate slight improvement in blood pressure with CPAP initiation.  He continues to be on atorvastatin 40 mg daily for hyperlipidemia.  LDL cholesterol in December 2021 was excellent at 22.  Renal function is stable at 0.9 creatinine.  He continues to be on Nexium for GERD.  BMI is 34.53 consistent with obesity.  Weight loss and increased  exercise was recommended.  I will see him within 3 months once he gets his CPAP machine for follow-up evaluation.     Troy Sine, MD, [facility] 10/12/2020 4:10 PM

## 2020-10-07 NOTE — Patient Instructions (Signed)
Medication Instructions:  Your physician recommends that you continue on your current medications as directed. Please refer to the Current Medication list given to you today.  *If you need a refill on your cardiac medications before your next appointment, please call your pharmacy*   Lab Work: None ordered.     Testing/Procedures: None ordered.    Follow-Up: At [facility], you and your health needs are our priority.  As part of our continuing mission to provide you with exceptional heart care, we have created designated Provider Care Teams.  These Care Teams include your primary Cardiologist (physician) and Advanced Practice Providers (APPs -  Physician Assistants and Nurse Practitioners) who all work together to provide you with the care you need, when you need it.  We recommend signing up for the patient portal called "MyChart".  Sign up information is provided on this After Visit Summary.  MyChart is used to connect with patients for Virtual Visits (Telemedicine).  Patients are able to view lab/test results, encounter notes, upcoming appointments, etc.  Non-urgent messages can be sent to your provider as well.   To learn more about what you can do with MyChart, go to [email or web address].    Your next appointment:   5 month(s)  The format for your next appointment:   In Person  Provider:   Shelva Majestic, MD

## 2020-10-12 ENCOUNTER — Encounter: Payer: Self-pay | Admitting: Cardiovascular Disease

## 2020-10-31 ENCOUNTER — Other Ambulatory Visit: Payer: Self-pay | Admitting: Cardiovascular Disease

## 2020-11-04 ENCOUNTER — Telehealth: Payer: Self-pay | Admitting: Cardiovascular Disease

## 2020-11-04 NOTE — Telephone Encounter (Signed)
[facility] called in regards to medication atorvastatin (LIPITOR) 40 MG tablet. Want to know when last time patient received medication.

## 2020-11-04 NOTE — Telephone Encounter (Signed)
Returned call to [employer] w/[facility]. She is calling to confirm that atorvastatin Rx was sent in to [facility]. Advised was sent in on 10/31/20

## 2020-11-04 NOTE — Telephone Encounter (Signed)
Left message to call back  

## 2020-11-04 NOTE — Telephone Encounter (Signed)
Timothy [name] is returning call.

## 2020-11-13 DIAGNOSIS — E785 Hyperlipidemia, unspecified: Secondary | ICD-10-CM | POA: Diagnosis not present

## 2020-11-13 DIAGNOSIS — I480 Paroxysmal atrial fibrillation: Secondary | ICD-10-CM | POA: Diagnosis not present

## 2020-11-13 DIAGNOSIS — I10 Essential (primary) hypertension: Secondary | ICD-10-CM | POA: Diagnosis not present

## 2020-11-13 DIAGNOSIS — N400 Enlarged prostate without lower urinary tract symptoms: Secondary | ICD-10-CM | POA: Diagnosis not present

## 2020-11-13 DIAGNOSIS — H2513 Age-related nuclear cataract, bilateral: Secondary | ICD-10-CM | POA: Diagnosis not present

## 2021-01-13 DIAGNOSIS — I119 Hypertensive heart disease without heart failure: Secondary | ICD-10-CM | POA: Diagnosis not present

## 2021-01-13 DIAGNOSIS — I480 Paroxysmal atrial fibrillation: Secondary | ICD-10-CM | POA: Diagnosis not present

## 2021-01-13 DIAGNOSIS — E785 Hyperlipidemia, unspecified: Secondary | ICD-10-CM | POA: Diagnosis not present

## 2021-01-27 ENCOUNTER — Other Ambulatory Visit: Payer: Self-pay | Admitting: Cardiovascular Disease

## 2021-01-28 ENCOUNTER — Other Ambulatory Visit: Payer: Self-pay

## 2021-01-28 MED ORDER — RIVAROXABAN 20 MG PO TABS: ORAL_TABLET | ORAL | 2 refills | Status: DC

## 2021-01-28 NOTE — Addendum Note (Signed)
Addended by: Johny Shock B on: 01/28/2021 01:52 PM   Modules accepted: Orders

## 2021-01-28 NOTE — Telephone Encounter (Addendum)
Prescription refill request for Xarelto received.  Indication: afib  Last office visit: Claiborne Billings 10/07/2020 Weight: 98.5 kg  Age: 75 yo  Scr: 0.90, 02/15/2020 CrCl: 174ml/min    Refill sent for Xarelto 20mg  daily- 30 tabs (1 month supply,  3 refills).   Previous xarelto refill was sent in by other staff member.

## 2021-02-12 DIAGNOSIS — I480 Paroxysmal atrial fibrillation: Secondary | ICD-10-CM | POA: Diagnosis not present

## 2021-02-12 DIAGNOSIS — I119 Hypertensive heart disease without heart failure: Secondary | ICD-10-CM | POA: Diagnosis not present

## 2021-02-12 DIAGNOSIS — E785 Hyperlipidemia, unspecified: Secondary | ICD-10-CM | POA: Diagnosis not present

## 2021-02-18 ENCOUNTER — Other Ambulatory Visit: Payer: Self-pay

## 2021-02-18 ENCOUNTER — Encounter: Payer: Self-pay | Admitting: Internal Medicine

## 2021-02-18 ENCOUNTER — Ambulatory Visit (INDEPENDENT_AMBULATORY_CARE_PROVIDER_SITE_OTHER): Payer: Medicare Other | Admitting: Internal Medicine

## 2021-02-18 ENCOUNTER — Telehealth: Payer: Self-pay | Admitting: Cardiovascular Disease

## 2021-02-18 VITALS — BP 130/77 | HR 71 | Ht 66.0 in | Wt 223.6 lb

## 2021-02-18 DIAGNOSIS — I480 Paroxysmal atrial fibrillation: Secondary | ICD-10-CM

## 2021-02-18 MED ORDER — METOPROLOL TARTRATE 50 MG PO TABS: 50.0000 mg | ORAL_TABLET | Freq: Two times a day (BID) | ORAL | 3 refills | Status: DC

## 2021-02-18 NOTE — Patient Instructions (Signed)
Medication Instructions:  PLEASE TAKE METOPROLOL 50mg  (ONE TABLET) TWICE DAILY  *If you need a refill on your cardiac medications before your next appointment, please call your pharmacy*  Follow-Up: At [facility], you and your health needs are our priority.  As part of our continuing mission to provide you with exceptional heart care, we have created designated Provider Care Teams.  These Care Teams include your primary Cardiologist (physician) and Advanced Practice Providers (APPs -  Physician Assistants and Nurse Practitioners) who all work together to provide you with the care you need, when you need it.  Your next appointment:   AS SCHEDULED   The format for your next appointment:   In Person  Provider:   Dr. Claiborne Billings

## 2021-02-18 NOTE — Telephone Encounter (Signed)
Timothy [name] c/o Palpitations:  High priority if Timothy [name] c/o lightheadedness, shortness of breath, or chest pain  How long have you had palpitations/irregular HR/ Afib? Are you having the symptoms now? 5 days since Thursday night, yes  Are you currently experiencing lightheadedness, SOB or CP? Some chest pain and a little bit of SOB, but not now  Do you have a history of afib (atrial fibrillation) or irregular heart rhythm? Yes, afib  Have you checked your BP or HR? (document readings if available): HR 140 at times  Are you experiencing any other symptoms? No   Timothy [name]'s wife states the Timothy [name] has been in afib for 5 days and he has never had it last this long. She says he has been getting chest pain and SOB as well.

## 2021-02-18 NOTE — Telephone Encounter (Signed)
Spoke with pt's wife she states he has been in a-fib for 5 days, chest pain when his heart is racing with SOB as well.Right now pt is up and dressed, not feeling well. Heart rate currently 110, with chest pain and SOB. Pt states normally when he has an episode it takes about 2 days and he will revert. "This has been going on since Thursday." Pt encouraged to go to the Emergency Department for the current symptoms, he does not want to go. Pt added to DOD schedule, it was reiterated he may have to go to the Emergency Department from the office. He verbalized understanding.  DOD made aware of the situation.  Pt states can you tell Dr. Claiborne Billings I still have not gotten my CPAP. He prescribed it back in June or July.

## 2021-02-18 NOTE — Progress Notes (Addendum)
Patient ID: DALANTE MINUS, male   DOB: [birth year], 75 y.o.   MRN: [id number]    HPI:  [name] is a 75 year old male who is a former patient of Dr.Richard Rollene Fare. 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  He has established CAD and in 1999 underwent a self-expanding radius stent to his LAD. His last nuclear perfusion study was in May 2013 which was low risk and without ischemia an echo Doppler study showed an ejection fraction in the 55-60% range. He had mild LA dilatation. The patient has a history of sick sinus syndrome with paroxysmal atrial fibrillation and remotely had been on amiodarone but developed skin toxicity secondary to do that secondary to this. Apparently he has undergone 2 radiofrequency ablations at [facility] by Dr. Ola Spurr initially in February 2009 and he required a second procedure in December 2009 for recurrent atrial fibrillation. He had developed a recurrent episode of atrial fibrillation several years ago at which time he underwent cardioversion and has been maintaining sinus rhythm with a rate coagulation therapy.  He last saw Dr. Rollene Fare in March 2014 at which time he was felt to be stable on his current medical regimen which consisted of Lopressor 25 twice a day, multaq 400 twice a day,  Xarelto 20 mg daily in addition to his atorvastatin 20 mg and fish oil for hyperlipidemia.   In 2015 he underwent colonoscopy.  He held his Xarelto for the procedure.  He was found of 6 polyps and will require repeat colonoscopy in 3 years.  When I saw him in 2017 he had experienced one episode of atrial fibrillation several weeks prior.  This occurred after working to significant exhaustion when he was straining, lifting sheet rock and remodeling a bathroom.     I saw him in November 2018 at which time he denied  any awareness of recurrent palpitations or atrial fibrillation.  He is retired from [employer] and has a part-time job as a Clinical biochemist.  However, he states he often is working at least 50 hours per week in this "part-time "position.  At that time he was on atorvastatin for hyperlipidemia, lisinopril 5 mg and metoprolol tartrate 25 mg twice a day for hypertension and PAF in addition to Xarelto anticoagulation.  Laboratory by Dr. Ann Held.  on 11/27/2016:  total cholesterol was 142, HDL 44, LDL 79, triglycerides 94.  Renal function was stable with a creatinine of 0.97.  LFTs were normal.  TSH was 1.3.    I saw him in January 2020 at which time he had  remained stable.  However  2 weeks prior to that evaluation he began to notice some URI type symptoms. While getting ready to do a church service he felt his heart become irregular and he believes he was in atrial fibrillation for 28 hours duration with a rate averaging around 110 bpm.  He did not take any extra metoprolol.  Ultimately he reverted back to sinus rhythm.  He later saw his primary physician and was diagnosed with bronchitis and received antibiotic therapy in addition to Spiriva and cough medication.  When I saw him, he denied any recurrent episode of atrial fibrillation since that episode and he denied any chest pressure.  He continued to be on Xarelto.  When I saw him in November  2020 he admitted that he continued to be stable and he denied any episodes of chest pain, exertional dyspnea or recurrent arrhythmia.  He continued to be on lisinopril 10 mg daily and metoprolol 25 mg twice a day in addition to Xarelto 20 mg daily.  He was on atorvastatin 40 mg for hyperlipidemia and continues to take over-the-counter fish oil.  He is on aspirin.  He was walking at least 5 days/week.    I saw him in December 2021.  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   He states his hours are 50 to 60 hours/week.  At that time he was having a significant cough which I felt was most likely due to lisinopril and as result I discontinued ACE inhibition and initiated valsartan 80 mg daily.    I last saw him in June 17, 2020.  His cough had improved with his medication adjustment.  He has not been very successful with weight loss.  At that time he was having  difficulty with sleeping.  He typically goes to bed between 9:30 and 10 pm and wakes up at approximately 6:30 AM.  He has nocturia 1 time per night and he does snore.  Last week he believes he had gone back into atrial fibrillation on Thursday evening and ultimately converted back to sinus rhythm after approximately 3 days on Monday.  He has experienced some ankle swelling when he has gone into atrial fibrillation.  He continued to be on Xarelto.  During that evaluation, his ECG showed sinus rhythm at 64 bpm with isolated PVC.  I was concerned that he may have obstructive sleep apnea which may be potentially contributing to his recurrent atrial fibrillation episode.  I recommended he undergo a follow-up echo Doppler study and also recommended a sleep evaluation.  On Jul 17, 2020 an echo Doppler study showed normal systolic function with EF at 60 to 65% with mild concentric LVH, and grade 2 diastolic dysfunction.  This was not significantly changed from his prior echo in January 2020.  He underwent a sleep study on August 14, 2020 and met split-night criteria.  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  CPAP was initiated at 6 cm and was titrated to optimal pressure at 16 cm of water.  He did have occasional PVCs during the study.  Per his last visit in in July 2020 :He has not yet been set up with his CPAP device due to supply chain issues.  I will try to have our nurse  coordinator contact the DME company to see where he stands with reference to receiving his equipment.  There had been up to a 4 to 63-month delay.  Presently he is going to bed between 10 and 10:30 at night and waking up at 630.  He denies chest pain.  He continues to be on valsartan 80 mg and metoprolol titrate 50 mg in the morning and 25 mg in the evening for hypertension and his PAF.  He is on atorvastatin 40 mg daily for hyperlipidemia.  He continues to take Xarelto for anticoagulation and is on Nexium for GERD.  He presents for evaluation.  Interim Hx: Patient comes in for an urgent visit. He felt his heart rates were high. He states he was in afib since Thursday night. It was  rapid and beating hard. On Friday he worked for [employer] and Saturday he has symptoms again. He was fatigued and felt CP. He feels much better today. He is typically in rhythm and has been for ~ 1 year per patient. He has energy. He does tartrate 50 mg in the AM, and 25 mg at night. He was on flecainide in the past. He has had 6 DCCVs. He had two PVIs. By Dr. Ola Spur[facility] at [facility].  He was on amiodarone in the past. Noted thyroid dysfunction on amio. Last TSH 02/15/2020 was normal 1.6  Past Medical History:  Diagnosis Date   Atrial fibrillation ([place])    CAD (coronary artery disease)    LAD stent in 1999   Hyperlipidemia    Hypertension    PAF (paroxysmal atrial fibrillation) ([place])    cardioversion in 2013   SSS (sick sinus syndrome) [facility])     Past Surgical History:  Procedure Laterality Date   CARDIAC ELECTROPHYSIOLOGY STUDY AND ABLATION  2008, 2009   RF ablation (x4) [facility] - Dr. Ola Spurr)   CARDIOVERSION  06/15/2011   Procedure: CARDIOVERSION;  Surgeon: Pixie Casino, MD;  Location: [place];  Service: Cardiovascular;  Laterality: N/A;   CORONARY ANGIOPLASTY WITH STENT PLACEMENT  10/29/1997   .0x20 self-expanding radius stent to LAD (Dr. Marella Chimes)   [place]  07/2011   bruce  myoview; partially fixed apical defect, worse at rest than stress; fixed inferobasal diaphragmatic attenuation artifact; no reversible ischemia; post-stress EF 60%; short run of NSVT; low risk scan    TRANSTHORACIC ECHOCARDIOGRAM  06/13/2011   EF 55-60%, mod conc LVH; LA mildly dilated    Allergies  Allergen Reactions   Nitroglycerin Other (See Comments)    "Blood Pressure goes to zero"    Current Outpatient Medications  Medication Sig Dispense Refill   aspirin 81 MG chewable tablet Chew 81 mg by mouth daily.     atorvastatin (LIPITOR) 40 MG tablet TAKE ONE TABLET BY MOUTH EVERYDAY AT BEDTIME 90 tablet 2   Cholecalciferol (VITAMIN D3) 1.25 MG (50000 UT) TABS Take by mouth daily. Patient taking 1000 units     esomeprazole (NEXIUM) 20 MG capsule Take 20 mg by mouth daily at 12 noon.     Garlic 6195 MG CAPS Take 1 capsule by mouth daily.     metoprolol tartrate (LOPRESSOR) 25 MG tablet TAKE TWO TABLETS BY MOUTH EVERY MORNING and TAKE ONE TABLET BY MOUTH EVERY EVENING 360 tablet 1   Omega-3 Fatty Acids (OMEGA 3 PO) Take 1,000 mg by mouth.      rivaroxaban (XARELTO) 20 MG TABS tablet TAKE ONE TABLET BY MOUTH EVERYDAY AT BEDTIME 30 tablet 2   valsartan (DIOVAN) 80 MG tablet TAKE ONE TABLET BY MOUTH EVERY MORNING 90 tablet 3   acetaminophen-codeine (TYLENOL #3) 300-30 MG tablet Take 1 tablet by mouth every 4 (four) hours as needed. (Patient not taking: Reported on 02/18/2021)     No current facility-administered medications for this visit.    Social history is notable in that he is married for 48 years. He has 3 children 10 grandchildren. 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  Family History  Problem Relation Age of Onset   Coronary artery disease Father        MI   Hypertension Father    Diabetes Mother    Hypertension Sister    Atrial fibrillation Child    Hypertension Child    ROS General: Negative;  No fevers, chills, or night sweats; positive for weight gain HEENT: Remote  history of photosensitivity when he was on amiodarone; No changes in vision or hearing, sinus congestion, difficulty swallowing Pulmonary: Recent bronchitis with mild wheezing Cardiovascular: See history of present illness; No chest pain, presyncope, syncope, palpitations GI: Negative; No nausea, vomiting, diarrhea, or abdominal pain GU: Negative; No dysuria, hematuria, or difficulty voiding Musculoskeletal: Negative; no myalgias, joint pain, or weakness Hematologic/Oncology: Negative; no easy bruising, bleeding Endocrine: Negative; no heat/cold intolerance; no diabetes Neuro: Negative; no changes in balance, headaches Skin: Negative; No rashes or skin lesions Psychiatric: Negative; No behavioral problems, depression Sleep: Positive for snoring, nodaytime sleepiness, hypersomnolence, bruxism, restless legs, hypnogognic hallucinations, no cataplexy Other comprehensive 14 point system review is negative.   PE Ht $Rem'5\' 6"'wTQQ$  (1.676 m)   Wt 223 lb 9.6 oz (101.4 kg)   BMI 36.09 kg/m    Repeat blood pressure by me was 138-140/78  Wt Readings from Last 3 Encounters:  02/18/21 223 lb 9.6 oz (101.4 kg)  10/07/20 217 lb 3.2 oz (98.5 kg)  08/14/20 210 lb (95.3 kg)   General: Alert, oriented, no distress.  Skin: normal turgor, no rashes, warm and dry HEENT: Normocephalic, atraumatic. Pupils equal round and reactive to light; sclera anicteric; extraocular muscles intact;  Nose without nasal septal hypertrophy Mouth/Parynx benign; Mallinpatti scale 4 Neck: No JVD, no carotid bruits; normal carotid upstroke Lungs: clear to ausculatation and percussion; no wheezing or rales Chest wall: without tenderness to palpitation Heart: PMI not displaced, RRR, s1 s2 normal, 1/6 systolic murmur, no diastolic murmur, no rubs, gallops, thrills, or heaves Abdomen: soft, nontender; no hepatosplenomehaly, BS+; abdominal aorta nontender and not dilated by palpation. Back: no CVA tenderness Pulses 2+ Musculoskeletal:  full range of motion, normal strength, no joint deformities Extremities: no clubbing cyanosis or edema, Homan's sign negative  Neurologic: grossly nonfocal; Cranial nerves grossly wnl Psychologic: Normal mood and affect   July 25, 2022ECG (independently read by me): Sinus Bradycardia at 57; no ectopy, normal intervals  April 2022 ECG (independently read by me): Normal sinus rhythm at 64 bpm, PVC, QTc interval 449 ms.  December 2021 ECG (independently read by me): NSR at 62, normal intervals  January 24, 2019 ECG (independently read by me): Sinus bradycardia 55 bpm.  No ectopy.  Normal intervals.  January 2020 ECG (independently read by me): Sinus bradycardia 55 bpm.  Normal intervals.  No ectopy.  November 2018 ECG (independently read by me): Sinus bradycardia 52 bpm.  Normal intervals.  No ST segment changes.  October 2017 ECG (independently read by me): Sinus rhythm at 59 bpm.  No significant ST-T changes.  QTc interval 461 ms.  August 2015 ECG (independently read by me): Sinus bradycardia 57 beats per minute.  QTc interval 455 ms.  January  2015 ECG (independently read by me):  Sinus bradycardia 54 beats per minute. Normal intervals. No ectopy.  Prior ECG of 01/05/2013: normal sinus rhythm at 62 beats per minute; PR interval 146 ms; QTc interval 468 ms.  EKG 02/18/2021: Sinus rhythm HR 73 bpm, PACS. Qtc 464 ms   LABS: BMP Latest Ref Rng & Units 02/15/2020 04/30/2017 11/22/2014  Glucose 65 - 99 mg/dL 110(H) 106(H) 104(H)  BUN 8 - 27 mg/dL $Remove'15 15 15  'LizvZGr$ Creatinine 0.76 - 1.27 mg/dL 0.90 0.79 1.01  BUN/Creat Ratio 10 - $Re'24 17 19 'Tkt$ -  Sodium 134 - 144 mmol/L 140 138 139  Potassium 3.5 - 5.2 mmol/L 4.2 4.3 4.6  Chloride 96 -  106 mmol/L 104 105 105  CO2 20 - 29 mmol/L $RemoveB'22 22 24  'FZinpuDI$ Calcium 8.6 - 10.2 mg/dL 9.0 9.0 8.9   Hepatic Function Latest Ref Rng & Units 04/03/2020 02/15/2020 04/30/2017  Total Protein 6.0 - 8.5 g/dL 6.6 6.5 6.5  Albumin 3.7 - 4.7 g/dL 4.3 4.3 4.3  AST 0 - 40 IU/L 30 37 23   ALT 0 - 44 IU/L 44 58(H) 32  Alk Phosphatase 44 - 121 IU/L 55 63 58  Total Bilirubin 0.0 - 1.2 mg/dL 0.6 0.9 0.8  Bilirubin, Direct 0.00 - 0.40 mg/dL 0.20 - -   CBC Latest Ref Rng & Units 02/15/2020 11/22/2014 08/31/2013  WBC 3.4 - 10.8 x10E3/uL 7.3 7.7 6.9  Hemoglobin 13.0 - 17.7 g/dL 14.2 14.4 14.0  Hematocrit 37.5 - 51.0 % 40.2 42.0 40.3  Platelets 150 - 450 x10E3/uL 240 258 235   Lab Results  Component Value Date   MCV 89 02/15/2020   MCV 91.5 11/22/2014   MCV 89.8 08/31/2013   Lab Results  Component Value Date   TSH 1.690 02/15/2020   Lipid Panel     Component Value Date/Time   CHOL 131 02/15/2020 0935   TRIG 120 02/15/2020 0935   HDL 47 02/15/2020 0935   CHOLHDL 2.8 02/15/2020 0935   CHOLHDL 2.9 11/22/2014 0856   VLDL 29 11/22/2014 0856   LDLCALC 63 02/15/2020 0935   RADIOLOGY: No results found.  IMPRESSION: No diagnosis found.   ASSESSMENT AND PLAN: Timothy [name] is a 75 year-old white male who has established CAD and underwent insertion of a self-expanding Radius stent to his LAD in 1999.  His last nuclear study in May 2013 was low risk without ischemia.  He has a history of sick sinus syndrome with paroxysmal atrial fibrillation and developed amiodarone skin toxicity.  He is status post 2 radiofrequency ablations.  When I saw him in January 2020 he had experienced one episode of recurrent atrial fibrillation.  He had been without any recurrent episodes of atrial fibrillation for greater than 2 years experienced an episode of AF which he believes lasted from 11 AM on Sunday until 3 PM on Monday with an average ventricular rate at 110 bpm and  again experienced an episode of atrial fibrillation in the evening on Thursday night and ultimately converted back to sinus rhythm 3 days later at the end of March/early April 2022.  When I saw him on June 17, 2020 I recommended titration of his metoprolol dose to 50 mg in the morning and 25 mg at night.  This has stabilized his heart  rhythm.  His ECG G today shows sinus rhythm and he is not aware of any recurrent atrial fibrillation.  Due to concerns for obstructive sleep apnea I referred him for a split-night sleep study.  I reviewed his sleep study with him in detail today.  On the diagnostic portion of the study, he was unable to achieve any REM sleep.  He had overall mild to moderate sleep apnea but his RDI index was significantly increased in excess of 90.  He was titrated up to 16 cm water pressure with excellent response.  Unfortunately, due to supply chain issues, he has not yet been able to receive a new ResMed air sense 11 CPAP auto unit.  We will contact the DME company to see where this stands with reference to the back order which had been 4 to 6 months.  Presently, his ECG is stable.  Blood pressure is slightly  increased in the upper 919T systolically and normal diastolically.  He is on valsartan 80 mg in addition to his metoprolol.  I anticipate slight improvement in blood pressure with CPAP initiation.  He continues to be on atorvastatin 40 mg daily for hyperlipidemia.  LDL cholesterol in December 2021 was excellent at 55.  Renal function is stable at 0.9 creatinine.  He continues to be on Nexium for GERD.  BMI is 34.53 consistent with obesity.  Weight loss and increased exercise was recommended.  I will see him within 3 months once he gets his CPAP machine for follow-up evaluation.  Today, for DOD visit for episode of afib that he experienced over the weekend. He converted to sinus rhythm with PACs. HR 73. We decided to increase his metop tartrate to 50 mg BID. He mentioned that he discussed a contingency plan with Dr. Claiborne Billings that if he were to have more persistent afib, that he may need to be seen at [facility]. He has an appointment with his primary cardiologist Dr. Claiborne Billings 03/18/2021   Janina Mayo

## 2021-03-13 DIAGNOSIS — Z79899 Other long term (current) drug therapy: Secondary | ICD-10-CM | POA: Diagnosis not present

## 2021-03-13 DIAGNOSIS — E785 Hyperlipidemia, unspecified: Secondary | ICD-10-CM | POA: Diagnosis not present

## 2021-03-13 DIAGNOSIS — N401 Enlarged prostate with lower urinary tract symptoms: Secondary | ICD-10-CM | POA: Diagnosis not present

## 2021-03-13 DIAGNOSIS — E8881 Metabolic syndrome: Secondary | ICD-10-CM | POA: Diagnosis not present

## 2021-03-13 DIAGNOSIS — I480 Paroxysmal atrial fibrillation: Secondary | ICD-10-CM | POA: Diagnosis not present

## 2021-03-13 DIAGNOSIS — Z0000 Encounter for general adult medical examination without abnormal findings: Secondary | ICD-10-CM | POA: Diagnosis not present

## 2021-03-13 DIAGNOSIS — N138 Other obstructive and reflux uropathy: Secondary | ICD-10-CM | POA: Diagnosis not present

## 2021-03-13 DIAGNOSIS — D6869 Other thrombophilia: Secondary | ICD-10-CM | POA: Diagnosis not present

## 2021-03-18 ENCOUNTER — Other Ambulatory Visit: Payer: Self-pay

## 2021-03-18 ENCOUNTER — Encounter: Payer: Self-pay | Admitting: Cardiovascular Disease

## 2021-03-18 ENCOUNTER — Ambulatory Visit (INDEPENDENT_AMBULATORY_CARE_PROVIDER_SITE_OTHER): Payer: Medicare Other | Admitting: Cardiovascular Disease

## 2021-03-18 VITALS — BP 132/70 | HR 49 | Ht 66.0 in | Wt 220.8 lb

## 2021-03-18 DIAGNOSIS — E785 Hyperlipidemia, unspecified: Secondary | ICD-10-CM | POA: Diagnosis not present

## 2021-03-18 DIAGNOSIS — Z7901 Long term (current) use of anticoagulants: Secondary | ICD-10-CM

## 2021-03-18 DIAGNOSIS — E668 Other obesity: Secondary | ICD-10-CM | POA: Diagnosis not present

## 2021-03-18 DIAGNOSIS — I2510 Atherosclerotic heart disease of native coronary artery without angina pectoris: Secondary | ICD-10-CM

## 2021-03-18 DIAGNOSIS — G4733 Obstructive sleep apnea (adult) (pediatric): Secondary | ICD-10-CM | POA: Diagnosis not present

## 2021-03-18 DIAGNOSIS — I480 Paroxysmal atrial fibrillation: Secondary | ICD-10-CM | POA: Diagnosis not present

## 2021-03-18 DIAGNOSIS — E782 Mixed hyperlipidemia: Secondary | ICD-10-CM

## 2021-03-18 DIAGNOSIS — Z79899 Other long term (current) drug therapy: Secondary | ICD-10-CM | POA: Diagnosis not present

## 2021-03-18 NOTE — Patient Instructions (Signed)
Medication Instructions:  The current medical regimen is effective;  continue present plan and medications as directed. Please refer to the Current Medication list given to you today.  *If you need a refill on your cardiac medications before your next appointment, please call your pharmacy*  Lab Work: [facility], [facility] AND TSH TODAY If you have labs (blood work) drawn today and your tests are completely normal, you will receive your results only by:  [place] (if you have MyChart) OR A paper copy in the mail.  If you have any lab test that is abnormal or we need to change your treatment, we will call you to review the results. You may go to any [facility] that is convenient for you however, we do have a lab in our office that is able to assist you. You DO NOT need an appointment for our lab. The lab is open 8:00am and closes at 4:00pm. Lunch 12:45 - 1:45pm.  Special Instructions WE WILL CHANGE YOU CPAP SETTINGS. CALL IF ANY ISSUES  Follow-Up: Your next appointment:  2 month(s) In Person with DR Claiborne Billings At [facility], you and your health needs are our priority.  As part of our continuing mission to provide you with exceptional heart care, we have created designated Provider Care Teams.  These Care Teams include your primary Cardiologist (physician) and Advanced Practice Providers (APPs -  Physician Assistants and Nurse Practitioners) who all work together to provide you with the care you need, when you need it.

## 2021-03-18 NOTE — Progress Notes (Addendum)
Patient ID: Timothy [name], male   DOB: 31-Mar-1945, 76 y.o.   MRN: [id number]    HPI:  Timothy [name] is a 76 year old male who is a former patient of Dr.Richard Rollene Fare. 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  He has established CAD and in 1999 underwent a self-expanding radius stent to his LAD. His last nuclear perfusion study was in May 2013 which was low risk and without ischemia an echo Doppler study showed an ejection fraction in the 55-60% range. He had mild LA dilatation. The patient has a history of sick sinus syndrome with paroxysmal atrial fibrillation and remotely had been on amiodarone but developed skin toxicity secondary to do that secondary to this. Apparently he has undergone 2 radiofrequency ablations at [facility] by Dr. Ola Spurr initially in February 2009 and he required a second procedure in December 2009 for recurrent atrial fibrillation. He had developed a recurrent episode of atrial fibrillation several years ago at which time he underwent cardioversion and has been maintaining sinus rhythm with a rate coagulation therapy.  He last saw Dr. Rollene Fare in March 2014 at which time he was felt to be stable on his current medical regimen which consisted of Lopressor 25 twice a day, multaq 400 twice a day,  Xarelto 20 mg daily in addition to his atorvastatin 20 mg and fish oil for hyperlipidemia.   In 2015 he underwent colonoscopy.  He held his Xarelto for the procedure.  He was found of 6 polyps and will require repeat colonoscopy in 3 years.  When I saw him in 2017 he had experienced one episode of atrial fibrillation several weeks prior.  This occurred after working to significant exhaustion when he was straining, lifting sheet rock and remodeling a bathroom.    I  saw him in November 2018 at which time he denied  any awareness of recurrent palpitations or atrial fibrillation.  He is retired from [employer] and has a part-time job as a Clinical biochemist.  However, he states he often is working at least 50 hours per week in this "part-time "position.  At that time he was on atorvastatin for hyperlipidemia, lisinopril 5 mg and metoprolol tartrate 25 mg twice a day for hypertension and PAF in addition to Xarelto anticoagulation.  Laboratory by Dr. Ann Held.  on 11/27/2016:  total cholesterol was 142, HDL 44, LDL 79, triglycerides 94.  Renal function was stable with a creatinine of 0.97.  LFTs were normal.  TSH was 1.3.    I saw him in January 2020 at which time he had  remained stable.  However  2 weeks prior to that evaluation he began to notice some URI type symptoms. While getting ready to do a church service he felt his heart become irregular and he believes he was in atrial fibrillation for 28 hours duration with a rate averaging around 110 bpm.  He did not take any extra metoprolol.  Ultimately he reverted back to sinus rhythm.  He later saw his primary physician and was diagnosed with bronchitis and received antibiotic therapy in addition to Spiriva and cough medication.  When I saw him, he denied any recurrent episode of atrial fibrillation since that episode and he denied any chest pressure.  He continued to be on Xarelto.  When I saw him in November 2020  he admitted that he continued to be stable and he denied any episodes of chest pain, exertional dyspnea or recurrent arrhythmia.  He continued to be on lisinopril 10 mg daily and metoprolol 25 mg twice a day in addition to Xarelto 20 mg daily.  He was on atorvastatin 40 mg for hyperlipidemia and continues to take over-the-counter fish oil.  He is on aspirin.  He was walking at least 5 days/week.    I saw him in December 2021.  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  He  states his hours are 50 to 60 hours/week.  At that time he was having a significant cough which I felt was most likely due to lisinopril and as result I discontinued ACE inhibition and initiated valsartan 80 mg daily.    I saw him in June 17, 2020.  His cough had improved with his medication adjustment.  He has not been very successful with weight loss.  At that time he was having  difficulty with sleeping.  He typically goes to bed between 9:30 and 10 pm and wakes up at approximately 6:30 AM.  He has nocturia 1 time per night and he does snore.  Last week he believes he had gone back into atrial fibrillation on Thursday evening and ultimately converted back to sinus rhythm after approximately 3 days on Monday.  He has experienced some ankle swelling when he has gone into atrial fibrillation.  He continued to be on Xarelto.  During that evaluation, his ECG showed sinus rhythm at 64 bpm with isolated PVC.  I was concerned that he may have obstructive sleep apnea which may be potentially contributing to his recurrent atrial fibrillation episode.  I recommended he undergo a follow-up echo Doppler study and also recommended a sleep evaluation.  On Jul 17, 2020 an echo Doppler study showed normal systolic function with EF at 60 to 65% with mild concentric LVH, and grade 2 diastolic dysfunction.  This was not significantly changed from his prior echo in January 2020.  He underwent a sleep study on August 14, 2020 and met split-night criteria.  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  CPAP was initiated at 6 cm and was titrated to optimal pressure at 16 cm of water.  He did have occasional PVCs during the study.  I last saw him on October 07, 2020.  At that time, he had not yet been set up with CPAP due to supply chain issues resulting in at least a 95-month delay.  He  is going to bed between 10 and 10:30 at night and waking up at 630.  He denies chest pain.  He continues to be on valsartan 80 mg and metoprolol titrate 50 mg in the morning and 25 mg in the evening for hypertension and his PAF.  He is on atorvastatin 40 mg daily for hyperlipidemia.  He continues to take Xarelto for anticoagulation and is on Nexium for GERD.    Since I last saw him, he states he had a brief episode of atrial fibrillation the week before Thanksgiving.  He is now taking metoprolol tartrate 50 mg twice a day in addition to his valsartan 80 mg daily for blood pressure.  He is on Xarelto for anticoagulation.  He continues to be on Lipitor 40 mg for  hyperlipidemia and Nexium for GERD.  He receives his CPAP machine on March 06, 2021 and has been on treatment for 11 days.  Presently he is set at a set pressure of 16 cm.  His average use is 6 hours and 36 minutes.  AHI is elevated at 11.3 and he had increased central events at 9.7/h.  He has been using a full facemask.  He presents for evaluation.  Past Medical History:  Diagnosis Date   Atrial fibrillation ([place])    CAD (coronary artery disease)    LAD stent in 1999   Hyperlipidemia    Hypertension    PAF (paroxysmal atrial fibrillation) ([place])    cardioversion in 2013   SSS (sick sinus syndrome) [facility])     Past Surgical History:  Procedure Laterality Date   CARDIAC ELECTROPHYSIOLOGY STUDY AND ABLATION  2008, 2009   RF ablation (x4) [facility] - Dr. Ola Spurr)   CARDIOVERSION  06/15/2011   Procedure: CARDIOVERSION;  Surgeon: Pixie Casino, MD;  Location: [place];  Service: Cardiovascular;  Laterality: N/A;   CORONARY ANGIOPLASTY WITH STENT PLACEMENT  10/29/1997   .0x20 self-expanding radius stent to LAD (Dr. Marella Chimes)   [place]  07/2011   bruce myoview; partially fixed apical defect, worse at rest than stress; fixed inferobasal diaphragmatic attenuation artifact; no reversible ischemia; post-stress EF  60%; short run of NSVT; low risk scan    TRANSTHORACIC ECHOCARDIOGRAM  06/13/2011   EF 55-60%, mod conc LVH; LA mildly dilated    Allergies  Allergen Reactions   Nitroglycerin Other (See Comments)    "Blood Pressure goes to zero"    Current Outpatient Medications  Medication Sig Dispense Refill   aspirin 81 MG chewable tablet Chew 81 mg by mouth daily.     atorvastatin (LIPITOR) 40 MG tablet TAKE ONE TABLET BY MOUTH EVERYDAY AT BEDTIME 90 tablet 2   Cholecalciferol (VITAMIN D3) 1.25 MG (50000 UT) TABS Take by mouth daily. Patient taking 1000 units     esomeprazole (NEXIUM) 20 MG capsule Take 20 mg by mouth daily at 12 noon.     Garlic 1610 MG CAPS Take 1 capsule by mouth daily.     metoprolol tartrate (LOPRESSOR) 50 MG tablet Take 1 tablet (50 mg total) by mouth 2 (two) times daily. PLEASE TAKE $RemoveBef'50mg'dcGqotOgHg$  (1) TABLET TWICE DAILY 60 tablet 3   Omega-3 Fatty Acids (OMEGA 3 PO) Take 1,000 mg by mouth.      rivaroxaban (XARELTO) 20 MG TABS tablet TAKE ONE TABLET BY MOUTH EVERYDAY AT BEDTIME 30 tablet 2   valsartan (DIOVAN) 80 MG tablet TAKE ONE TABLET BY MOUTH EVERY MORNING 90 tablet 3   No current facility-administered medications for this visit.    Social history is notable in that he is married for 48 years. He has 3 children 10 grandchildren. 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  Family History  Problem Relation Age of Onset   Coronary artery disease Father        MI   Hypertension Father    Diabetes Mother    Hypertension Sister    Atrial fibrillation Child    Hypertension Child    ROS General: Negative; No fevers, chills, or night sweats; positive for weight gain HEENT: Remote history of photosensitivity when he was on amiodarone; No changes in vision or hearing, sinus congestion, difficulty swallowing Pulmonary: Recent bronchitis with mild wheezing Cardiovascular: See history of present illness; No chest pain, presyncope, syncope, palpitations GI:  Negative; No nausea,  vomiting, diarrhea, or abdominal pain GU: Negative; No dysuria, hematuria, or difficulty voiding Musculoskeletal: Negative; no myalgias, joint pain, or weakness Hematologic/Oncology: Negative; no easy bruising, bleeding Endocrine: Negative; no heat/cold intolerance; no diabetes Neuro: Negative; no changes in balance, headaches Skin: Negative; No rashes or skin lesions Psychiatric: Negative; No behavioral problems, depression Sleep: Positive for snoring, nodaytime sleepiness, hypersomnolence; he received a 6 ResMed air sense 11 AutoSet CPAP unit on March 06, 2021.  No Bruxism, restless legs, hypnogognic hallucinations, no cataplexy Other comprehensive 14 point system review is negative.   PE BP 132/70    Pulse (!) 49    Ht $R'5\' 6"'cQ$  (1.676 m)    Wt 220 lb 12.8 oz (100.2 kg)    SpO2 98%    BMI 35.64 kg/m    Repeat blood pressure by me was 130/70  Wt Readings from Last 3 Encounters:  03/18/21 220 lb 12.8 oz (100.2 kg)  02/18/21 223 lb 9.6 oz (101.4 kg)  10/07/20 217 lb 3.2 oz (98.5 kg)   General: Alert, oriented, no distress.  Skin: normal turgor, no rashes, warm and dry HEENT: Normocephalic, atraumatic. Pupils equal round and reactive to light; sclera anicteric; extraocular muscles intact; Nose without nasal septal hypertrophy Mouth/Parynx benign; Mallinpatti scale 4 Neck: No JVD, no carotid bruits; normal carotid upstroke Lungs: clear to ausculatation and percussion; no wheezing or rales Chest wall: without tenderness to palpitation Heart: PMI not displaced, RRR, s1 s2 normal, 1/6 systolic murmur, no diastolic murmur, no rubs, gallops, thrills, or heaves Abdomen: soft, nontender; no hepatosplenomehaly, BS+; abdominal aorta nontender and not dilated by palpation. Back: no CVA tenderness Pulses 2+ Musculoskeletal: full range of motion, normal strength, no joint deformities Extremities: no clubbing cyanosis or edema, Homan's sign negative  Neurologic: grossly nonfocal; Cranial nerves  grossly wnl Psychologic: Normal mood and affect   ECG (independently read by me):  Sinus bradycardia at 49, no ectopy  October 07, 2020 ECG (independently read by me): Sinus Bradycardia at 57; no ectopy, normal intervals  April 2022 ECG (independently read by me): Normal sinus rhythm at 64 bpm, PVC, QTc interval 449 ms.  December 2021 ECG (independently read by me): NSR at 62, normal intervals  January 24, 2019 ECG (independently read by me): Sinus bradycardia 55 bpm.  No ectopy.  Normal intervals.  January 2020 ECG (independently read by me): Sinus bradycardia 55 bpm.  Normal intervals.  No ectopy.  November 2018 ECG (independently read by me): Sinus bradycardia 52 bpm.  Normal intervals.  No ST segment changes.  October 2017 ECG (independently read by me): Sinus rhythm at 59 bpm.  No significant ST-T changes.  QTc interval 461 ms.  August 2015 ECG (independently read by me): Sinus bradycardia 57 beats per minute.  QTc interval 455 ms.  January  2015 ECG (independently read by me):  Sinus bradycardia 54 beats per minute. Normal intervals. No ectopy.  Prior ECG of 01/05/2013: normal sinus rhythm at 62 beats per minute; PR interval 146 ms; QTc interval 468 ms.   LABS: BMP Latest Ref Rng & Units 03/18/2021 02/15/2020 04/30/2017  Glucose 70 - 99 mg/dL 97 110(H) 106(H)  BUN 8 - 27 mg/dL $Remove'15 15 15  'PVSumsi$ Creatinine 0.76 - 1.27 mg/dL 0.89 0.90 0.79  BUN/Creat Ratio 10 - $Re'24 17 17 19  'vyl$ Sodium 134 - 144 mmol/L 139 140 138  Potassium 3.5 - 5.2 mmol/L 4.4 4.2 4.3  Chloride 96 - 106 mmol/L 103 104 105  CO2 20 - 29  mmol/L '22 22 22  '$ Calcium 8.6 - 10.2 mg/dL 9.2 9.0 9.0   Hepatic Function Latest Ref Rng & Units 03/18/2021 04/03/2020 02/15/2020  Total Protein 6.0 - 8.5 g/dL 6.3 6.6 6.5  Albumin 3.7 - 4.7 g/dL 4.3 4.3 4.3  AST 0 - 40 IU/L 45(H) 30 37  ALT 0 - 44 IU/L 59(H) 44 58(H)  Alk Phosphatase 44 - 121 IU/L 63 55 63  Total Bilirubin 0.0 - 1.2 mg/dL 1.0 0.6 0.9  Bilirubin, Direct 0.00 - 0.40 mg/dL -  0.20 -   CBC Latest Ref Rng & Units 03/18/2021 02/15/2020 11/22/2014  WBC 3.4 - 10.8 x10E3/uL 7.3 7.3 7.7  Hemoglobin 13.0 - 17.7 g/dL 14.2 14.2 14.4  Hematocrit 37.5 - 51.0 % 41.9 40.2 42.0  Platelets 150 - 450 x10E3/uL 225 240 258   Lab Results  Component Value Date   MCV 91 03/18/2021   MCV 89 02/15/2020   MCV 91.5 11/22/2014   Lab Results  Component Value Date   TSH 1.130 03/18/2021   Lipid Panel     Component Value Date/Time   CHOL 134 03/18/2021 1225   TRIG 114 03/18/2021 1225   HDL 44 03/18/2021 1225   CHOLHDL 3.0 03/18/2021 1225   CHOLHDL 2.9 11/22/2014 0856   VLDL 29 11/22/2014 0856   LDLCALC 69 03/18/2021 1225   RADIOLOGY: No results found.  IMPRESSION: 1. OSA (obstructive sleep apnea)   2. CAD in native artery   3. PAF (paroxysmal atrial fibrillation) ([place])   4. Hyperlipidemia with target LDL less than 70   5. Anticoagulation adequate   6. Moderate obesity   7. Medication management     ASSESSMENT AND PLAN: Mr. Middlebrooks is a 76 year-old white male who has established CAD and underwent insertion of a self-expanding Radius stent to his LAD in 1999.  His last nuclear study in May 2013 was low risk without ischemia.  He has a history of sick sinus syndrome with paroxysmal atrial fibrillation and developed amiodarone skin toxicity.  He is status post 2 radiofrequency ablations.  When I saw him in January 2020 he had experienced one episode of recurrent atrial fibrillation.  He had been without any recurrent episodes of atrial fibrillation for greater than 2 years experienced an episode of AF which he believes lasted from 11 AM on a Sunday until 3 PM on Monday with an average ventricular rate at 110 bpm and  again experienced an episode of atrial fibrillation in the evening on Thursday night and ultimately converted back to sinus rhythm 3 days later at the end of March/early April 2022.  When I saw him on June 17, 2020 I recommended titration of his metoprolol dose to 50 mg  in the morning and 25 mg at night.  This has stabilized his heart rhythm.  Due to concerns for obstructive sleep apnea potentially contributing to his PAF, he underwent a sleep study..  On the diagnostic portion of the study, he was unable to achieve any REM sleep.  He had overall mild to moderate sleep apnea but his RDI index was significantly increased in excess of 90.  He was titrated up to 16 cm water pressure with excellent response.  Unfortunately due to supply chain issues he was unable to receive his CPAP machine until March 06, 2021.  Of note, the week before Thanksgiving when he was still not on treatment, he again had an episode of recurrent AF and since that time has increased his metoprolol to 50 mg  twice a day.  His ECG today shows sinus bradycardia at 49 bpm.  His blood pressure today is stable and on repeat by me was 130/70.  He has been on CPAP therapy for for 11 days.  His unit has been set at a set pressure of 16 cm.  His most recent download demonstrates 100% use for the last 11 days since his set up with average use at 6 hours and 36 minutes.  AHI is elevated at 11.3 with a central index of 9.7.  As result, I am changing his settings.  I will increase his ramp start pressure to 6.  I am changing him from a fixed set 16 cm pressure to an auto mode of 10 to 18 cm of water.  I am rechecking laboratory with a comprehensive metabolic panel, TSH, lipid studies, LP(a), as well as CBC.  I have recommended he continue taking the metoprolol at the 50 mg twice daily dose.  I will see him in 2 months for reevaluation and further recommendations will be made at that time.    Troy Sine, MD, [facility] 03/22/2021 10:51 AM

## 2021-03-19 LAB — COMPREHENSIVE METABOLIC PANEL
ALT: 59 IU/L — ABNORMAL HIGH (ref 0–44)
AST: 45 IU/L — ABNORMAL HIGH (ref 0–40)
Albumin/Globulin Ratio: 2.2 (ref 1.2–2.2)
Albumin: 4.3 g/dL (ref 3.7–4.7)
Alkaline Phosphatase: 63 IU/L (ref 44–121)
BUN/Creatinine Ratio: 17 (ref 10–24)
BUN: 15 mg/dL (ref 8–27)
Bilirubin Total: 1 mg/dL (ref 0.0–1.2)
CO2: 22 mmol/L (ref 20–29)
Calcium: 9.2 mg/dL (ref 8.6–10.2)
Chloride: 103 mmol/L (ref 96–106)
Creatinine, Ser: 0.89 mg/dL (ref 0.76–1.27)
Globulin, Total: 2 g/dL (ref 1.5–4.5)
Glucose: 97 mg/dL (ref 70–99)
Potassium: 4.4 mmol/L (ref 3.5–5.2)
Sodium: 139 mmol/L (ref 134–144)
Total Protein: 6.3 g/dL (ref 6.0–8.5)
eGFR: 90 mL/min/{1.73_m2} (ref >59)

## 2021-03-19 LAB — CBC
Hematocrit: 41.9 % (ref 37.5–51.0)
Hemoglobin: 14.2 g/dL (ref 13.0–17.7)
MCH: 30.8 pg (ref 26.6–33.0)
MCHC: 33.9 g/dL (ref 31.5–35.7)
MCV: 91 fL (ref 79–97)
Platelets: 225 10*3/uL (ref 150–450)
RBC: 4.61 x10E6/uL (ref 4.14–5.80)
RDW: 13.1 % (ref 11.6–15.4)
WBC: 7.3 10*3/uL (ref 3.4–10.8)

## 2021-03-19 LAB — LIPID PANEL
Chol/HDL Ratio: 3 ratio (ref 0.0–5.0)
Cholesterol, Total: 134 mg/dL (ref 100–199)
HDL: 44 mg/dL (ref >39)
LDL Chol Calc (NIH): 69 mg/dL (ref 0–99)
Triglycerides: 114 mg/dL (ref 0–149)
VLDL Cholesterol Cal: 21 mg/dL (ref 5–40)

## 2021-03-19 LAB — TSH: TSH: 1.13 u[IU]/mL (ref 0.450–4.500)

## 2021-03-22 ENCOUNTER — Encounter: Payer: Self-pay | Admitting: Cardiovascular Disease

## 2021-05-06 ENCOUNTER — Other Ambulatory Visit: Payer: Self-pay

## 2021-05-06 ENCOUNTER — Ambulatory Visit (INDEPENDENT_AMBULATORY_CARE_PROVIDER_SITE_OTHER): Payer: Medicare Other | Admitting: Cardiovascular Disease

## 2021-05-06 ENCOUNTER — Encounter: Payer: Self-pay | Admitting: Cardiovascular Disease

## 2021-05-06 VITALS — BP 134/72 | HR 69 | Ht 66.0 in | Wt 222.0 lb

## 2021-05-06 DIAGNOSIS — I480 Paroxysmal atrial fibrillation: Secondary | ICD-10-CM | POA: Diagnosis not present

## 2021-05-06 DIAGNOSIS — E785 Hyperlipidemia, unspecified: Secondary | ICD-10-CM

## 2021-05-06 DIAGNOSIS — E669 Obesity, unspecified: Secondary | ICD-10-CM | POA: Diagnosis not present

## 2021-05-06 DIAGNOSIS — I2510 Atherosclerotic heart disease of native coronary artery without angina pectoris: Secondary | ICD-10-CM | POA: Diagnosis not present

## 2021-05-06 DIAGNOSIS — Z7901 Long term (current) use of anticoagulants: Secondary | ICD-10-CM | POA: Diagnosis not present

## 2021-05-06 DIAGNOSIS — G4733 Obstructive sleep apnea (adult) (pediatric): Secondary | ICD-10-CM | POA: Diagnosis not present

## 2021-05-06 NOTE — Progress Notes (Signed)
Patient ID: Timothy [name], male   DOB: 28-Jun-1945, 76 y.o.   MRN: [id number]    HPI:  [name] is a 76 year old male who is a former patient of Dr.Richard Rollene Fare. 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  He has established CAD and in 1999 underwent a self-expanding radius stent to his LAD. His last nuclear perfusion study was in May 2013 which was low risk and without ischemia an echo Doppler study showed an ejection fraction in the 55-60% range. He had mild LA dilatation. The patient has a history of sick sinus syndrome with paroxysmal atrial fibrillation and remotely had been on amiodarone but developed skin toxicity secondary to do that secondary to this. Apparently he has undergone 2 radiofrequency ablations at [facility] by Dr. Ola Spurr initially in February 2009 and he required a second procedure in December 2009 for recurrent atrial fibrillation. He had developed a recurrent episode of atrial fibrillation several years ago at which time he underwent cardioversion and has been maintaining sinus rhythm with a rate coagulation therapy.  He last saw Dr. Rollene Fare in March 2014 at which time he was felt to be stable on his current medical regimen which consisted of Lopressor 25 twice a day, multaq 400 twice a day,  Xarelto 20 mg daily in addition to his atorvastatin 20 mg and fish oil for hyperlipidemia.   In 2015 he underwent colonoscopy.  He held his Xarelto for the procedure.  He was found of 6 polyps and will require repeat colonoscopy in 3 years.  When I saw him in 2017 he had experienced one episode of atrial fibrillation several weeks prior.  This occurred after working to significant exhaustion when he was straining, lifting sheet rock and remodeling a bathroom.     I saw him in November 2018 at which time he denied  any awareness of recurrent palpitations or atrial fibrillation.  He is retired from [employer] and has a part-time job as a Clinical biochemist.  However, he states he often is working at least 50 hours per week in this "part-time "position.  At that time he was on atorvastatin for hyperlipidemia, lisinopril 5 mg and metoprolol tartrate 25 mg twice a day for hypertension and PAF in addition to Xarelto anticoagulation.  Laboratory by Dr. Ann Held.  on 11/27/2016:  total cholesterol was 142, HDL 44, LDL 79, triglycerides 94.  Renal function was stable with a creatinine of 0.97.  LFTs were normal.  TSH was 1.3.    I saw him in January 2020 at which time he had  remained stable.  However  2 weeks prior to that evaluation he began to notice some URI type symptoms. While getting ready to do a church service he felt his heart become irregular and he believes he was in atrial fibrillation for 28 hours duration with a rate averaging around 110 bpm.  He did not take any extra metoprolol.  Ultimately he reverted back to sinus rhythm.  He later saw his primary physician and was diagnosed with bronchitis and received antibiotic therapy in addition to Spiriva and cough medication.  When I saw him, he denied any recurrent episode of atrial fibrillation since that episode and he denied any chest pressure.  He continued to be on Xarelto.  When I saw him in  November 2020 he admitted that he continued to be stable and he denied any episodes of chest pain, exertional dyspnea or recurrent arrhythmia.  He continued to be on lisinopril 10 mg daily and metoprolol 25 mg twice a day in addition to Xarelto 20 mg daily.  He was on atorvastatin 40 mg for hyperlipidemia and continues to take over-the-counter fish oil.  He is on aspirin.  He was walking at least 5 days/week.    I saw him in December 2021.  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   He states his hours are 50 to 60 hours/week.  At that time he was having a significant cough which I felt was most likely due to lisinopril and as result I discontinued ACE inhibition and initiated valsartan 80 mg daily.    I saw him in June 17, 2020.  His cough had improved with his medication adjustment.  He has not been very successful with weight loss.  At that time he was having  difficulty with sleeping.  He typically goes to bed between 9:30 and 10 pm and wakes up at approximately 6:30 AM.  He has nocturia 1 time per night and he does snore.  Last week he believes he had gone back into atrial fibrillation on Thursday evening and ultimately converted back to sinus rhythm after approximately 3 days on Monday.  He has experienced some ankle swelling when he has gone into atrial fibrillation.  He continued to be on Xarelto.  During that evaluation, his ECG showed sinus rhythm at 64 bpm with isolated PVC.  I was concerned that he may have obstructive sleep apnea which may be potentially contributing to his recurrent atrial fibrillation episode.  I recommended he undergo a follow-up echo Doppler study and also recommended a sleep evaluation.  On Jul 17, 2020 an echo Doppler study showed normal systolic function with EF at 60 to 65% with mild concentric LVH, and grade 2 diastolic dysfunction.  This was not significantly changed from his prior echo in January 2020.  He underwent a sleep study on August 14, 2020 and met split-night criteria.  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  CPAP was initiated at 6 cm and was titrated to optimal pressure at 16 cm of water.  He did have occasional PVCs during the study.  I saw him on October 07, 2020.  At that time, he had not yet been set up with CPAP due to supply chain issues resulting in at least a 86-monthdelay.  He is  going to bed between 10 and 10:30 at night and waking up at 630.  He denies chest pain.  He continues to be on valsartan 80 mg and metoprolol titrate 50 mg in the morning and 25 mg in the evening for hypertension and his PAF.  He is on atorvastatin 40 mg daily for hyperlipidemia.  He continues to take Xarelto for anticoagulation and is on Nexium for GERD.    I last saw him on March 18, 2021.  Since his prior evaluation he had a brief episode of atrial fibrillation the week before Thanksgiving.  He is now taking metoprolol tartrate 50 mg twice a day in addition to his valsartan 80 mg daily for blood pressure.  He is on Xarelto for anticoagulation.  He continues  to be on Lipitor 40 mg for hyperlipidemia and Nexium for GERD.  He receives his CPAP machine on March 06, 2021 and has been on treatment for 11 days.  Presently he is set at a set pressure of 16 cm.  His average use is 6 hours and 36 minutes.  AHI is elevated at 11.3 and he had increased central events at 9.7/h.  He has been using a full facemask.  During that evaluation, I made some adjustments to his CPAP machine and increased his ramp start pressure to 6 and changed him from a fixed set pressure of 16 to an auto mode pressure range of 10 to 18 cm of water.  Since I last saw him, he has felt improved on CPAP.  A download was obtained from January 23 through February 21.  During this time he had a stomach virus and was afraid of throwing up and did not use CPAP for slightly over a weeks duration.  He has resumed CPAP since.  His initial mask was torn and he has now been using the full facemask from his original sleep study.  He typically goes to bed between 930 and 10 and wakes up at 6:30 AM.  His most recent download at his pressure range of 10 to 18 reveals an AHI of 9.5.  His 95th percentile pressure is 12.5 with maximum average pressure 13.5.  He is having both apneic as well as central events with a central index of 6.8.  He denies any chest pain  or significant shortness of breath.  He presents for reevaluation.  Past Medical History:  Diagnosis Date   Atrial fibrillation ([place])    CAD (coronary artery disease)    LAD stent in 1999   Hyperlipidemia    Hypertension    PAF (paroxysmal atrial fibrillation) ([place])    cardioversion in 2013   SSS (sick sinus syndrome) [facility])     Past Surgical History:  Procedure Laterality Date   CARDIAC ELECTROPHYSIOLOGY STUDY AND ABLATION  2008, 2009   RF ablation (x4) [facility] - Dr. Ola Spurr)   CARDIOVERSION  06/15/2011   Procedure: CARDIOVERSION;  Surgeon: Pixie Casino, MD;  Location: [facility];  Service: Cardiovascular;  Laterality: N/A;   CORONARY ANGIOPLASTY WITH STENT PLACEMENT  10/29/1997   .0x20 self-expanding radius stent to LAD (Dr. Marella Chimes)   [place]  07/2011   bruce myoview; partially fixed apical defect, worse at rest than stress; fixed inferobasal diaphragmatic attenuation artifact; no reversible ischemia; post-stress EF 60%; short run of NSVT; low risk scan    TRANSTHORACIC ECHOCARDIOGRAM  06/13/2011   EF 55-60%, mod conc LVH; LA mildly dilated    Allergies  Allergen Reactions   Nitroglycerin Other (See Comments)    "Blood Pressure goes to zero"    Current Outpatient Medications  Medication Sig Dispense Refill   aspirin 81 MG chewable tablet Chew 81 mg by mouth daily.     atorvastatin (LIPITOR) 40 MG tablet TAKE ONE TABLET BY MOUTH EVERYDAY AT BEDTIME 90 tablet 2   Cholecalciferol (VITAMIN D3) 1.25 MG (50000 UT) TABS Take by mouth daily. Patient taking 1000 units     esomeprazole (NEXIUM) 20 MG capsule Take 20 mg by mouth daily at 12 noon.     Garlic 5993 MG CAPS Take 1 capsule by mouth daily.     metoprolol tartrate (LOPRESSOR) 50 MG tablet Take 1 tablet (50 mg total) by mouth 2 (two) times daily. PLEASE TAKE 74m (  1) TABLET TWICE DAILY 60 tablet 3   Omega-3 Fatty Acids (OMEGA 3 PO) Take 1,000 mg by mouth.      rivaroxaban (XARELTO) 20 MG TABS  tablet TAKE ONE TABLET BY MOUTH EVERYDAY AT BEDTIME 30 tablet 2   valsartan (DIOVAN) 80 MG tablet TAKE ONE TABLET BY MOUTH EVERY MORNING 90 tablet 3   No current facility-administered medications for this visit.    Social history is notable in that he is married for 48 years. He has 3 children 10 grandchildren. 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  Family History  Problem Relation Age of Onset   Coronary artery disease Father        MI   Hypertension Father    Diabetes Mother    Hypertension Sister    Atrial fibrillation Child    Hypertension Child    ROS General: Negative; No fevers, chills, or night sweats; positive for weight gain HEENT: Remote history of photosensitivity when he was on amiodarone; No changes in vision or hearing, sinus congestion, difficulty swallowing Pulmonary: Recent bronchitis with mild wheezing Cardiovascular: See history of present illness; No chest pain, presyncope, syncope, palpitations GI: Negative; No nausea, vomiting, diarrhea, or abdominal pain GU: Negative; No dysuria, hematuria, or difficulty voiding Musculoskeletal: Negative; no myalgias, joint pain, or weakness Hematologic/Oncology: Negative; no easy bruising, bleeding Endocrine: Negative; no heat/cold intolerance; no diabetes Neuro: Negative; no changes in balance, headaches Skin: Negative; No rashes or skin lesions Psychiatric: Negative; No behavioral problems, depression Sleep: Positive for snoring, nodaytime sleepiness, hypersomnolence; he received a 6 ResMed air sense 11 AutoSet CPAP unit on March 06, 2021.  No Bruxism, restless legs, hypnogognic hallucinations, no cataplexy Other comprehensive 14 point system review is negative.   PE BP 134/72    Pulse 69    Ht _0  (1.676 m)    Wt 222 lb (100.7 kg)    SpO2 97%    BMI 35.83 kg/m    Repeat blood pressure by me 122/68  Wt Readings from Last 3 Encounters:  05/06/21 222 lb (100.7 kg)  03/18/21 220 lb 12.8 oz (100.2 kg)   02/18/21 223 lb 9.6 oz (101.4 kg)   General: Alert, oriented, no distress.  Skin: normal turgor, no rashes, warm and dry HEENT: Normocephalic, atraumatic. Pupils equal round and reactive to light; sclera anicteric; extraocular muscles intact;  Nose without nasal septal hypertrophy Mouth/Parynx benign; Mallinpatti scale 4 Neck: No JVD, no carotid bruits; normal carotid upstroke Lungs: clear to ausculatation and percussion; no wheezing or rales Chest wall: without tenderness to palpitation Heart: PMI not displaced, RRR, rare ectopy, s1 s2 normal, 1/6 systolic murmur, no diastolic murmur, no rubs, gallops, thrills, or heaves Abdomen: soft, nontender; no hepatosplenomehaly, BS+; abdominal aorta nontender and not dilated by palpation. Back: no CVA tenderness Pulses 2+ Musculoskeletal: full range of motion, normal strength, no joint deformities Extremities: no clubbing cyanosis or edema, Homan's sign negative  Neurologic: grossly nonfocal; Cranial nerves grossly wnl Psychologic: Normal mood and affect  May 06, 2021 ECG (independently read by me): SInus rhythm at 69, PVCs, nonspecific T wave abnormality  March 18, 2021 ECG (independently read by me):  Sinus bradycardia at 49, no ectopy  October 07, 2020 ECG (independently read by me): Sinus Bradycardia at 57; no ectopy, normal intervals  April 2022 ECG (independently read by me): Normal sinus rhythm at 64 bpm, PVC, QTc interval 449 ms.  December 2021 ECG (independently read by me): NSR at 62, normal intervals  January 24, 2019  ECG (independently read by me): Sinus bradycardia 55 bpm.  No ectopy.  Normal intervals.  January 2020 ECG (independently read by me): Sinus bradycardia 55 bpm.  Normal intervals.  No ectopy.  November 2018 ECG (independently read by me): Sinus bradycardia 52 bpm.  Normal intervals.  No ST segment changes.  October 2017 ECG (independently read by me): Sinus rhythm at 59 bpm.  No significant ST-T changes.  QTc  interval 461 ms.  August 2015 ECG (independently read by me): Sinus bradycardia 57 beats per minute.  QTc interval 455 ms.  January  2015 ECG (independently read by me):  Sinus bradycardia 54 beats per minute. Normal intervals. No ectopy.  Prior ECG of 01/05/2013: normal sinus rhythm at 62 beats per minute; PR interval 146 ms; QTc interval 468 ms.   LABS: BMP Latest Ref Rng & Units 03/18/2021 02/15/2020 04/30/2017  Glucose 70 - 99 mg/dL 97 110(H) 106(H)  BUN 8 - 27 mg/dL _0 Creatinine 0.76 - 1.27 mg/dL 0.89 0.90 0.79  BUN/Creat Ratio 10 - _1 Sodium 134 - 144 mmol/L 139 140 138  Potassium 3.5 - 5.2 mmol/L 4.4 4.2 4.3  Chloride 96 - 106 mmol/L 103 104 105  CO2 20 - 29 mmol/L _2 Calcium 8.6 - 10.2 mg/dL 9.2 9.0 9.0   Hepatic Function Latest Ref Rng & Units 03/18/2021 04/03/2020 02/15/2020  Total Protein 6.0 - 8.5 g/dL 6.3 6.6 6.5  Albumin 3.7 - 4.7 g/dL 4.3 4.3 4.3  AST 0 - 40 IU/L 45(H) 30 37  ALT 0 - 44 IU/L 59(H) 44 58(H)  Alk Phosphatase 44 - 121 IU/L 63 55 63  Total Bilirubin 0.0 - 1.2 mg/dL 1.0 0.6 0.9  Bilirubin, Direct 0.00 - 0.40 mg/dL - 0.20 -   CBC Latest Ref Rng & Units 03/18/2021 02/15/2020 11/22/2014  WBC 3.4 - 10.8 x10E3/uL 7.3 7.3 7.7  Hemoglobin 13.0 - 17.7 g/dL 14.2 14.2 14.4  Hematocrit 37.5 - 51.0 % 41.9 40.2 42.0  Platelets 150 - 450 x10E3/uL 225 240 258   Lab Results  Component Value Date   MCV 91 03/18/2021   MCV 89 02/15/2020   MCV 91.5 11/22/2014   Lab Results  Component Value Date   TSH 1.130 03/18/2021   Lipid Panel     Component Value Date/Time   CHOL 134 03/18/2021 1225   TRIG 114 03/18/2021 1225   HDL 44 03/18/2021 1225   CHOLHDL 3.0 03/18/2021 1225   CHOLHDL 2.9 11/22/2014 0856   VLDL 29 11/22/2014 0856   LDLCALC 69 03/18/2021 1225   RADIOLOGY: No results found.  IMPRESSION: 1. OSA (obstructive sleep apnea)   2. CAD in native artery   3. PAF (paroxysmal atrial fibrillation) ([place])   4. Anticoagulation adequate   5.  Obesity, Class II, BMI 35-39.9   6. Hyperlipidemia with target LDL less than 70     ASSESSMENT AND PLAN: [name] is a 76 year-old white male who has established CAD and underwent insertion of a self-expanding Radius stent to his LAD in 1999.  His last nuclear study in May 2013 was low risk without ischemia.  He has a history of sick sinus syndrome with paroxysmal atrial fibrillation and developed amiodarone skin toxicity.  He is status post 2 radiofrequency ablations.  When I saw him in January 2020 he had experienced one episode of recurrent atrial fibrillation.  He had been without any recurrent episodes of atrial fibrillation for greater than 2  years but experienced an episode of AF which he believes lasted from 69 AM on a Sunday until 3 PM on Monday with an average ventricular rate at 110 bpm and  again experienced an episode of atrial fibrillation in the evening on Thursday night and ultimately converted back to sinus rhythm 3 days later at the end of March/early April 2022.  When I saw him on June 17, 2020 I recommended titration of his metoprolol dose to 50 mg in the morning and 25 mg at night.  This has stabilized his heart rhythm.  Due to concerns for obstructive sleep apnea potentially contributing to his PAF, he underwent a sleep study. On the diagnostic portion of the study, he was unable to achieve any REM sleep.  He had overall mild to moderate sleep apnea but his RDI index was significantly increased in excess of 90.  He was titrated up to 16 cm water pressure with excellent response.  Unfortunately due to supply chain issues he was unable to receive his CPAP machine until March 06, 2021.  Of note, the week before Thanksgiving when he was still not on treatment, he again had an episode of recurrent AF and since that time has increased his metoprolol to 50 mg twice a day.  When evaluated on March 18, 2021 he was maintaining sinus rhythm with bradycardia.  His initial CPAP therapy was  really reviewed but he had only been on treatment for 11 days prior to that evaluation.  He was noted to have both obstructive and central apneic events.  During that evaluation I changed him from a 16 cm set CPAP mode to an auto mode of 11 to 18 cm.  He was using therapy consistently but unfortunately developed a severe stomach virus resulting in vomiting and during this time.  He did not use CPAP.  He also had issues with his initial mask and is now using the full facemask from his sleep study.  His most recent download shows average use of 6 hours and 45 minutes per night.  He continues to have both central and obstructive events and most recent AHI is 9.5.  I will make slight adjustment to his pressure range changed to 11 to 18 cm; however, if his AHI continues to be increased with predominant central events we will need to switch him to BiPAP therapy.  I am also changing his ramp start pressure to 7 from 4 cm of water.  I will ask that a download be obtained in 3 to 4 weeks for further evaluation.  Presently, his blood pressure is stable and a repeat by me was 122/68 on metoprolol tartrate 50 mg twice a day, valsartan 80 mg daily.  He is on Xarelto 20 mg for anticoagulation with his PAF history.  He continues to be on atorvastatin 40 mg daily at bedtime and takes Nexium for GERD.  If he remains stable, I will see him in 6 months for reevaluation but if needed we will see him as soon as possible if changes need to be made regarding his obstructive sleep apnea.     Troy Sine, MD, [facility] 05/11/2021 10:48 AM

## 2021-05-06 NOTE — Patient Instructions (Signed)
Medication Instructions:  Your physician recommends that you continue on your current medications as directed. Please refer to the Current Medication list given to you today.  *If you need a refill on your cardiac medications before your next appointment, please call your pharmacy*  Follow-Up: At [facility], you and your health needs are our priority.  As part of our continuing mission to provide you with exceptional heart care, we have created designated Provider Care Teams.  These Care Teams include your primary Cardiologist (physician) and Advanced Practice Providers (APPs -  Physician Assistants and Nurse Practitioners) who all work together to provide you with the care you need, when you need it.  We recommend signing up for the patient portal called "MyChart".  Sign up information is provided on this After Visit Summary.  MyChart is used to connect with patients for Virtual Visits (Telemedicine).  Patients are able to view lab/test results, encounter notes, upcoming appointments, etc.  Non-urgent messages can be sent to your provider as well.   To learn more about what you can do with MyChart, go to [email or web address].    Your next appointment:   6 months with Dr. Claiborne Billings

## 2021-05-11 ENCOUNTER — Encounter: Payer: Self-pay | Admitting: Cardiovascular Disease

## 2021-05-13 DIAGNOSIS — I480 Paroxysmal atrial fibrillation: Secondary | ICD-10-CM | POA: Diagnosis not present

## 2021-05-13 DIAGNOSIS — E785 Hyperlipidemia, unspecified: Secondary | ICD-10-CM | POA: Diagnosis not present

## 2021-05-13 DIAGNOSIS — I119 Hypertensive heart disease without heart failure: Secondary | ICD-10-CM | POA: Diagnosis not present

## 2021-05-21 ENCOUNTER — Ambulatory Visit: Payer: Medicare Other | Admitting: Cardiovascular Disease

## 2021-08-08 ENCOUNTER — Other Ambulatory Visit: Payer: Self-pay

## 2021-08-08 MED ORDER — METOPROLOL TARTRATE 50 MG PO TABS: 50.0000 mg | ORAL_TABLET | Freq: Two times a day (BID) | ORAL | 10 refills | Status: DC

## 2021-08-12 DIAGNOSIS — S39012A Strain of muscle, fascia and tendon of lower back, initial encounter: Secondary | ICD-10-CM | POA: Diagnosis not present

## 2021-08-12 DIAGNOSIS — Z6836 Body mass index (BMI) 36.0-36.9, adult: Secondary | ICD-10-CM | POA: Diagnosis not present

## 2021-08-12 DIAGNOSIS — J069 Acute upper respiratory infection, unspecified: Secondary | ICD-10-CM | POA: Diagnosis not present

## 2021-09-11 ENCOUNTER — Ambulatory Visit (INDEPENDENT_AMBULATORY_CARE_PROVIDER_SITE_OTHER): Payer: Medicare Other | Admitting: Physician Assistant

## 2021-09-11 ENCOUNTER — Encounter: Payer: Self-pay | Admitting: Physician Assistant

## 2021-09-11 VITALS — BP 110/70 | HR 137 | Ht 66.0 in | Wt 219.8 lb

## 2021-09-11 DIAGNOSIS — I4891 Unspecified atrial fibrillation: Secondary | ICD-10-CM

## 2021-09-11 DIAGNOSIS — I2510 Atherosclerotic heart disease of native coronary artery without angina pectoris: Secondary | ICD-10-CM | POA: Diagnosis not present

## 2021-09-11 DIAGNOSIS — E785 Hyperlipidemia, unspecified: Secondary | ICD-10-CM | POA: Diagnosis not present

## 2021-09-11 DIAGNOSIS — I10 Essential (primary) hypertension: Secondary | ICD-10-CM

## 2021-09-11 DIAGNOSIS — Z01818 Encounter for other preprocedural examination: Secondary | ICD-10-CM | POA: Diagnosis not present

## 2021-09-11 MED ORDER — METOPROLOL TARTRATE 75 MG PO TABS: 75.0000 mg | ORAL_TABLET | Freq: Two times a day (BID) | ORAL | 3 refills | Status: DC

## 2021-09-11 NOTE — Progress Notes (Signed)
Cardiology Office Note:    Date:  09/13/2021   ID:  Timothy [name], DOB 01-Jan-1946, MRN [id number]  PCP:  Street, [place], [place] [facility] Cardiologist:  Shelva Majestic, MD     Referring MD: Street, Sharon Mt, *   Chief Complaint  Patient presents with   Follow-up    Seen for Dr. Claiborne Billings    History of Present Illness:    Timothy [name] is a 76 y.o. male with a hx of CAD, hypertension, hyperlipidemia, sick sinus syndrome and PAF.  Patient underwent stenting of his LAD in 1999.  Myoview in May 2013 was low risk.  Echocardiogram at the time showed EF 55 to 60%, mild left atrial dilatation.  He was on amiodarone remotely however developed skin toxicity secondary to the medication.  He has undergone 2 radiofrequency ablation at [facility] by Dr. Ola Spurr in February 2009 and December 2009.  Echocardiogram in Aug 02, 2020 showed EF 60 to 65%, mild LVH, grade 1 DD.  He was found to have mild obstructive sleep apnea based on the previous sleep study in June 2022.  Patient was last seen by Dr. Gretta Began in December 2022 due to elevated heart rate.  By the time he was seen he was already back in sinus rhythm metoprolol tartrate was increased to 50 mg twice a day.  Patient presents today for follow-up.  He reportedly has gone back into atrial fibrillation since the end of May.  Heart rate on arrival was elevated in the 130s.  He has been feeling more short of breath and more fatigued.  On physical exam, he appears to be euvolemic.  I recommend to increase metoprolol tartrate to 75 mg twice a day temporarily.  He has a history of bradycardia in sinus rhythm on higher dose of beta-blocker.  My hope is to switch him back to metoprolol 50 mg twice a day prior to cardioverting him.  I discussed the case with Dr. Percival Spanish DOD and the patient, I recommend proceed with outpatient DC cardioversion.  He has not missed any doses of anticoagulation therapy for the past 3  weeks.  He mentions that Dr. Ola Spurr, his old EP physician has moved to [facility].  He has 2 previous ablations and the may require another ablation given recurrence of A-fib.  We will refer the patient to EP service after cardioversion.   Past Medical History:  Diagnosis Date   Atrial fibrillation ([place])    CAD (coronary artery disease)    LAD stent in 1999   Hyperlipidemia    Hypertension    PAF (paroxysmal atrial fibrillation) ([place])    cardioversion in 2013   SSS (sick sinus syndrome) [facility])     Past Surgical History:  Procedure Laterality Date   CARDIAC ELECTROPHYSIOLOGY STUDY AND ABLATION  2008, 2009   RF ablation (x4) [facility] - Dr. Ola Spurr)   CARDIOVERSION  06/15/2011   Procedure: CARDIOVERSION;  Surgeon: Pixie Casino, MD;  Location: [place];  Service: Cardiovascular;  Laterality: N/A;   CORONARY ANGIOPLASTY WITH STENT PLACEMENT  10/29/1997   .0x20 self-expanding radius stent to LAD (Dr. Marella Chimes)   [place]  07/2011   bruce myoview; partially fixed apical defect, worse at rest than stress; fixed inferobasal diaphragmatic attenuation artifact; no reversible ischemia; post-stress EF 60%; short run of NSVT; low risk scan    TRANSTHORACIC ECHOCARDIOGRAM  06/13/2011   EF 55-60%, mod conc LVH; LA mildly dilated  Current Medications: Current Meds  Medication Sig   aspirin 81 MG chewable tablet Chew 81 mg by mouth daily.   atorvastatin (LIPITOR) 40 MG tablet TAKE ONE TABLET BY MOUTH EVERYDAY AT BEDTIME   esomeprazole (NEXIUM) 20 MG capsule Take 20 mg by mouth daily.   Garlic 2111 MG CAPS Take 1 capsule by mouth daily.   metoprolol tartrate (LOPRESSOR) 50 MG tablet Take 1 tablet (50 mg total) by mouth 2 (two) times daily. (Patient taking differently: Take 50 mg by mouth 2 (two) times daily. Take with 25 mg to equal 75 mg twice daily)   Omega-3 Fatty Acids (OMEGA 3 PO) Take 1,000 mg by mouth.    rivaroxaban (XARELTO) 20 MG TABS tablet TAKE ONE  TABLET BY MOUTH EVERYDAY AT BEDTIME   valsartan (DIOVAN) 80 MG tablet TAKE ONE TABLET BY MOUTH EVERY MORNING   [DISCONTINUED] Cholecalciferol (VITAMIN D3) 1.25 MG (50000 UT) TABS Take by mouth daily. Patient taking 1000 units   [DISCONTINUED] metoprolol tartrate (LOPRESSOR) 50 MG tablet Take 1 tablet (50 mg total) by mouth 2 (two) times daily. PLEASE TAKE '50mg'$  (1) TABLET TWICE DAILY     Allergies:   Nitroglycerin   Social History   Socioeconomic History   Marital status: Married    Spouse name: Not on file   Number of children: 3   Years of education: Not on file   Highest education level: Not on file  Occupational History   Not on file  Tobacco Use   Smoking status: Former    Packs/day: 1.00    Types: Cigarettes    Quit date: 04/07/1975    Years since quitting: 46.4   Smokeless tobacco: Former   Tobacco comments:    quit smoking 1973. quit smokeless about 30 years ago.  Substance and Sexual Activity   Alcohol use: Yes   Drug use: No   Sexual activity: Not on file  Other Topics Concern   Not on file  Social History Narrative   Not on file   Social Determinants of Health   Financial Resource Strain: Not on file  Food Insecurity: Not on file  Transportation Needs: Not on file  Physical Activity: Not on file  Stress: Not on file  Social Connections: Not on file     Family History: The patient's family history includes Atrial fibrillation in his child; Coronary artery disease in his father; Diabetes in his mother; Hypertension in his child, father, and sister.  ROS:   Please see the history of present illness.     All other systems reviewed and are negative.  EKGs/Labs/Other Studies Reviewed:    The following studies were reviewed today:  Echo 07/17/2020  1. Left ventricular ejection fraction, by estimation, is 60 to 65%. The  left ventricle has normal function. The left ventricle has no regional  wall motion abnormalities. There is mild concentric left  ventricular  hypertrophy. Left ventricular diastolic  parameters are consistent with Grade II diastolic dysfunction  (pseudonormalization). The average left ventricular global longitudinal  strain is -21.8 %. The global longitudinal strain is normal.   2. Right ventricular systolic function is normal. The right ventricular  size is normal. Tricuspid regurgitation signal is inadequate for assessing  PA pressure.   3. The mitral valve is normal in structure. Trivial mitral valve  regurgitation. No evidence of mitral stenosis.   4. The aortic valve is tricuspid. Aortic valve regurgitation is trivial.  No aortic stenosis is present.   5. Aortic dilatation noted. There  is mild dilatation of the ascending  aorta, measuring 41 mm.   6. The inferior vena cava is normal in size with greater than 50%  respiratory variability, suggesting right atrial pressure of 3 mmHg.   Comparison(s): Compared to prior echo in 03/2018, there is no significant  change.   EKG:  EKG is ordered today.  The ekg ordered today demonstrates atrial fibrillation with RVR  Recent Labs: 03/18/2021: ALT 59; BUN 15; Creatinine, Ser 0.89; Hemoglobin 14.2; Platelets 225; Potassium 4.4; Sodium 139; TSH 1.130  Recent Lipid Panel    Component Value Date/Time   CHOL 134 03/18/2021 1225   TRIG 114 03/18/2021 1225   HDL 44 03/18/2021 1225   CHOLHDL 3.0 03/18/2021 1225   CHOLHDL 2.9 11/22/2014 0856   VLDL 29 11/22/2014 0856   LDLCALC 69 03/18/2021 1225     Risk Assessment/Calculations:    CHA2DS2-VASc Score = 3   This indicates a 3.2% annual risk of stroke. The patient's score is based upon: CHF History: 0 HTN History: 1 Diabetes History: 0 Stroke History: 0 Vascular Disease History: 1 Age Score: 1 Gender Score: 0          Physical Exam:    VS:  BP 110/70   Pulse (!) 137   Ht '5\' 6"'$  (1.676 m)   Wt 219 lb 12.8 oz (99.7 kg)   SpO2 95%   BMI 35.48 kg/m     Wt Readings from Last 3 Encounters:  09/11/21 219  lb 12.8 oz (99.7 kg)  05/06/21 222 lb (100.7 kg)  03/18/21 220 lb 12.8 oz (100.2 kg)     GEN:  Well nourished, well developed in no acute distress HEENT: Normal NECK: No JVD; No carotid bruits LYMPHATICS: No lymphadenopathy CARDIAC: Irregularly irregular, tachycardic, no murmurs, rubs, gallops RESPIRATORY:  Clear to auscultation without rales, wheezing or rhonchi  ABDOMEN: Soft, non-tender, non-distended MUSCULOSKELETAL:  No edema; No deformity  SKIN: Warm and dry NEUROLOGIC:  Alert and oriented x 3 PSYCHIATRIC:  Normal affect   ASSESSMENT:    1. Atrial fibrillation with RVR ([place])   2. Preprocedural examination   3. Coronary artery disease involving native coronary artery of native heart without angina pectoris   4. Primary hypertension   5. Hyperlipidemia LDL goal <70    PLAN:    In order of problems listed above:  Atrial fibrillation with RVR: Case discussed with both DOD Dr. Percival Spanish and the patient.  We will proceed with outpatient cardioversion.  Heart rate is in the 130s in the office, he has been in A-fib with RVR for the past month.  We will increase metoprolol tartrate temporarily to 75 mg twice a day.  He has a history of bradycardia in sinus rhythm on higher dose of rate control medication, in the morning of the cardioversion, he will go back to 50 mg twice a day of metoprolol thereafter.  We will refer the patient to EP service after the cardioversion.  He has 2 previous episodes of ablation, however his old EP physician has moved to [place], he would be interested in another ablation and wished to discuss with EP service.  Once he completed cardioversion, we can consider repeating echocardiogram on the next visit, given how long he has been in A-fib, I would not be too surprised if his EF is now lower.  CAD: Denies any recent chest pain  Hypertension: Blood pressure stable  Hyperlipidemia: On Lipitor      Shared Decision Making/Informed Consent The risks  (stroke,  cardiac arrhythmias rarely resulting in the need for a temporary or permanent pacemaker, skin irritation or burns and complications associated with conscious sedation including aspiration, arrhythmia, respiratory failure and death), benefits (restoration of normal sinus rhythm) and alternatives of a direct current cardioversion were explained in detail to Mr. Kiss and he agrees to proceed.      Medication Adjustments/Labs and Tests Ordered: Current medicines are reviewed at length with the patient today.  Concerns regarding medicines are outlined above.  Orders Placed This Encounter  Procedures   Basic metabolic panel   CBC   Ambulatory referral to Cardiac Electrophysiology   EKG 12-Lead   Meds ordered this encounter  Medications   DISCONTD: metoprolol tartrate 75 MG TABS    Sig: Take 75 mg by mouth 2 (two) times daily. PLEASE TAKE '50mg'$  (1) TABLET TWICE DAILY    Dispense:  180 tablet    Refill:  3    Dose change new Rx   metoprolol tartrate (LOPRESSOR) 50 MG tablet    Sig: Take 1 tablet (50 mg total) by mouth 2 (two) times daily.    Dispense:  180 tablet    Refill:  3    Patient Instructions  Medication Instructions:  INCREASE Metoprolol Tartrate to 75 mg 2 times a day temporarily, then resume previous dose 50 mg 2 times a day on the day.  *If you need a refill on your cardiac medications before your next appointment, please call your pharmacy*  Lab Work: Your physician recommends that you return for lab work the morning of Cardioversion:  BMET CBC  If you have labs (blood work) drawn today and your tests are completely normal, you will receive your results only by: MyChart Message (if you have MyChart) OR A paper copy in the mail If you have any lab test that is abnormal or we need to change your treatment, we will call you to review the results.  Testing/Procedures: You have been referred to Cardiac Electrophysiology   Your physician has recommended that you  have a Cardioversion (DCCV). Electrical Cardioversion uses a jolt of electricity to your heart either through paddles or wired patches attached to your chest. This is a controlled, usually prescheduled, procedure. Defibrillation is done under light anesthesia in the hospital, and you usually go home the day of the procedure. This is done to get your heart back into a normal rhythm. You are not awake for the procedure. Please see the instruction sheet given to you today.   Follow-Up: At [facility], you and your health needs are our priority.  As part of our continuing mission to provide you with exceptional heart care, we have created designated Provider Care Teams.  These Care Teams include your primary Cardiologist (physician) and Advanced Practice Providers (APPs -  Physician Assistants and Nurse Practitioners) who all work together to provide you with the care you need, when you need it.   Your next appointment:   2 weeks after Cardioversion  The format for your next appointment:   In Person  Provider:   Almyra Deforest, PA-C        Other Instructions Dear Pat Patrick,  You are scheduled for a TEE/Cardioversion/TEE Cardioversion on Monday 09/15/21 with Dr. Marlou Porch.  Please arrive at the [facility] (Main Entrance A) at [facility]: [address] [place], [place] [address] at 9:00 AM. (1 hour prior to procedure unless lab work is needed; if lab work is needed arrive 1.5 hours ahead)  DIET: Nothing to  eat or drink after midnight except a sip of water with medications (see medication instructions below)  FYI: For your safety, and to allow [place] to monitor your vital signs accurately during the surgery/procedure we request that   if you have artificial nails, gel coating, SNS etc. Please have those removed prior to your surgery/procedure. Not having the nail coverings /polish removed may result in cancellation or delay of your surgery/procedure.   Medication Instructions:  Continue  your anticoagulant: Xarelto  You will need to continue your anticoagulant after your procedure until you  are told by your  Provider that it is safe to stop   Labs: If patient is on Coumadin, patient needs pt/INR, CBC, BMET within 3 days (No pt/INR needed for patients taking Xarelto, Eliquis, Pradaxa) For patients receiving anesthesia for TEE and all Cardioversion patients: BMET, CBC within 1 week  Your lab work will be done at the hospital prior to your procedure - you will need to arrive 1  hours ahead of your procedure  You must have a responsible person to drive you home and stay in the waiting area during your procedure. Failure to do so could result in cancellation.  Bring your insurance cards.  *Special Note: Every effort is made to have your procedure done on time. Occasionally there are emergencies that occur at the hospital that may cause delays. Please be patient if a delay does occur.   Important Information About Sugar         Hilbert Corrigan, [place]  09/13/2021 12:02 AM    [place]

## 2021-09-11 NOTE — Patient Instructions (Addendum)
Medication Instructions:  INCREASE Metoprolol Tartrate to 75 mg 2 times a day temporarily, then resume previous dose 50 mg 2 times a day on the day.  *If you need a refill on your cardiac medications before your next appointment, please call your pharmacy*  Lab Work: Your physician recommends that you return for lab work the morning of Cardioversion:  BMET CBC  If you have labs (blood work) drawn today and your tests are completely normal, you will receive your results only by: MyChart Message (if you have MyChart) OR A paper copy in the mail If you have any lab test that is abnormal or we need to change your treatment, we will call you to review the results.  Testing/Procedures: You have been referred to Cardiac Electrophysiology   Your physician has recommended that you have a Cardioversion (DCCV). Electrical Cardioversion uses a jolt of electricity to your heart either through paddles or wired patches attached to your chest. This is a controlled, usually prescheduled, procedure. Defibrillation is done under light anesthesia in the hospital, and you usually go home the day of the procedure. This is done to get your heart back into a normal rhythm. You are not awake for the procedure. Please see the instruction sheet given to you today.   Follow-Up: At [facility], you and your health needs are our priority.  As part of our continuing mission to provide you with exceptional heart care, we have created designated Provider Care Teams.  These Care Teams include your primary Cardiologist (physician) and Advanced Practice Providers (APPs -  Physician Assistants and Nurse Practitioners) who all work together to provide you with the care you need, when you need it.   Your next appointment:   2 weeks after Cardioversion  The format for your next appointment:   In Person  Provider:   Almyra Deforest, PA-C        Other Instructions Dear Timothy [name],  You are scheduled for a  TEE/Cardioversion/TEE Cardioversion on Monday 09/15/21 with Dr. Marlou Porch.  Please arrive at the [facility] (Main Entrance A) at [facility]: [address] [place], [place] [address] at 9:00 AM. (1 hour prior to procedure unless lab work is needed; if lab work is needed arrive 1.5 hours ahead)  DIET: Nothing to eat or drink after midnight except a sip of water with medications (see medication instructions below)  FYI: For your safety, and to allow [place] to monitor your vital signs accurately during the surgery/procedure we request that   if you have artificial nails, gel coating, SNS etc. Please have those removed prior to your surgery/procedure. Not having the nail coverings /polish removed may result in cancellation or delay of your surgery/procedure.   Medication Instructions:  Continue your anticoagulant: Xarelto  You will need to continue your anticoagulant after your procedure until you  are told by your  Provider that it is safe to stop   Labs: If patient is on Coumadin, patient needs pt/INR, CBC, BMET within 3 days (No pt/INR needed for patients taking Xarelto, Eliquis, Pradaxa) For patients receiving anesthesia for TEE and all Cardioversion patients: BMET, CBC within 1 week  Your lab work will be done at the hospital prior to your procedure - you will need to arrive 1  hours ahead of your procedure  You must have a responsible person to drive you home and stay in the waiting area during your procedure. Failure to do so could result in cancellation.  Bring your insurance cards.  *  Special Note: Every effort is made to have your procedure done on time. Occasionally there are emergencies that occur at the hospital that may cause delays. Please be patient if a delay does occur.   Important Information About Sugar

## 2021-09-11 NOTE — H&P (View-Only) (Signed)
Cardiology Office Note:    Date:  09/13/2021   ID:  Timothy [name], DOB [birth year], MRN [id number]  PCP:  Street, [place], [place] Prov[facility] Cardiologist:  Shelva Majestic, MD     Referring MD: Street, Sharon Mt, *   Chief Complaint  Patient presents with   Follow-up    Seen for Dr. Claiborne Billings    History of Present Illness:    Timothy [name] is a 76 y.o. male with a hx of CAD, hypertension, hyperlipidemia, sick sinus syndrome and PAF.  Patient underwent stenting of his LAD in 1999.  Myoview in May 2013 was low risk.  Echocardiogram at the time showed EF 55 to 60%, mild left atrial dilatation.  He was on amiodarone remotely however developed skin toxicity secondary to the medication.  He has undergone 2 radiofrequency ablation at [facility] by Dr. Ola Spurr in February 2009 and December 2009.  Echocardiogram in Aug 02, 2020 showed EF 60 to 65%, mild LVH, grade 1 DD.  He was found to have mild obstructive sleep apnea based on the previous sleep study in June 2022.  Patient was last seen by Dr. Gretta Began in December 2022 due to elevated heart rate.  By the time he was seen he was already back in sinus rhythm metoprolol tartrate was increased to 50 mg twice a day.  Patient presents today for follow-up.  He reportedly has gone back into atrial fibrillation since the end of May.  Heart rate on arrival was elevated in the 130s.  He has been feeling more short of breath and more fatigued.  On physical exam, he appears to be euvolemic.  I recommend to increase metoprolol tartrate to 75 mg twice a day temporarily.  He has a history of bradycardia in sinus rhythm on higher dose of beta-blocker.  My hope is to switch him back to metoprolol 50 mg twice a day prior to cardioverting him.  I discussed the case with Dr. Percival Spanish DOD and the patient, I recommend proceed with outpatient DC cardioversion.  He has not missed any doses of anticoagulation therapy for the past 3  weeks.  He mentions that Dr. Ola Spurr, his old EP physician has moved to [facility].  He has 2 previous ablations and the may require another ablation given recurrence of A-fib.  We will refer the patient to EP service after cardioversion.   Past Medical History:  Diagnosis Date   Atrial fibrillation ([place])    CAD (coronary artery disease)    LAD stent in 1999   Hyperlipidemia    Hypertension    PAF (paroxysmal atrial fibrillation) ([place])    cardioversion in 2013   SSS (sick sinus syndrome) [facility])     Past Surgical History:  Procedure Laterality Date   CARDIAC ELECTROPHYSIOLOGY STUDY AND ABLATION  2008, 2009   RF ablation (x4) [facility] - Dr. Ola Spurr)   CARDIOVERSION  06/15/2011   Procedure: CARDIOVERSION;  Surgeon: Pixie Casino, MD;  Location: [place];  Service: Cardiovascular;  Laterality: N/A;   CORONARY ANGIOPLASTY WITH STENT PLACEMENT  10/29/1997   .0x20 self-expanding radius stent to LAD (Dr. Marella Chimes)   [place]  07/2011   bruce myoview; partially fixed apical defect, worse at rest than stress; fixed inferobasal diaphragmatic attenuation artifact; no reversible ischemia; post-stress EF 60%; short run of NSVT; low risk scan    TRANSTHORACIC ECHOCARDIOGRAM  06/13/2011   EF 55-60%, mod conc LVH; LA mildly dilated  Current Medications: Current Meds  Medication Sig   aspirin 81 MG chewable tablet Chew 81 mg by mouth daily.   atorvastatin (LIPITOR) 40 MG tablet TAKE ONE TABLET BY MOUTH EVERYDAY AT BEDTIME   esomeprazole (NEXIUM) 20 MG capsule Take 20 mg by mouth daily.   Garlic 5732 MG CAPS Take 1 capsule by mouth daily.   metoprolol tartrate (LOPRESSOR) 50 MG tablet Take 1 tablet (50 mg total) by mouth 2 (two) times daily. (Patient taking differently: Take 50 mg by mouth 2 (two) times daily. Take with 25 mg to equal 75 mg twice daily)   Omega-3 Fatty Acids (OMEGA 3 PO) Take 1,000 mg by mouth.    rivaroxaban (XARELTO) 20 MG TABS tablet TAKE ONE  TABLET BY MOUTH EVERYDAY AT BEDTIME   valsartan (DIOVAN) 80 MG tablet TAKE ONE TABLET BY MOUTH EVERY MORNING   [DISCONTINUED] Cholecalciferol (VITAMIN D3) 1.25 MG (50000 UT) TABS Take by mouth daily. Patient taking 1000 units   [DISCONTINUED] metoprolol tartrate (LOPRESSOR) 50 MG tablet Take 1 tablet (50 mg total) by mouth 2 (two) times daily. PLEASE TAKE '50mg'$  (1) TABLET TWICE DAILY     Allergies:   Nitroglycerin   Social History   Socioeconomic History   Marital status: Married    Spouse name: Not on file   Number of children: 3   Years of education: Not on file   Highest education level: Not on file  Occupational History   Not on file  Tobacco Use   Smoking status: Former    Packs/day: 1.00    Types: Cigarettes    Quit date: 04/07/1975    Years since quitting: 46.4   Smokeless tobacco: Former   Tobacco comments:    quit smoking 1973. quit smokeless about 30 years ago.  Substance and Sexual Activity   Alcohol use: Yes   Drug use: No   Sexual activity: Not on file  Other Topics Concern   Not on file  Social History Narrative   Not on file   Social Determinants of Health   Financial Resource Strain: Not on file  Food Insecurity: Not on file  Transportation Needs: Not on file  Physical Activity: Not on file  Stress: Not on file  Social Connections: Not on file     Family History: The patient's family history includes Atrial fibrillation in his child; Coronary artery disease in his father; Diabetes in his mother; Hypertension in his child, father, and sister.  ROS:   Please see the history of present illness.     All other systems reviewed and are negative.  EKGs/Labs/Other Studies Reviewed:    The following studies were reviewed today:  Echo 07/17/2020  1. Left ventricular ejection fraction, by estimation, is 60 to 65%. The  left ventricle has normal function. The left ventricle has no regional  wall motion abnormalities. There is mild concentric left  ventricular  hypertrophy. Left ventricular diastolic  parameters are consistent with Grade II diastolic dysfunction  (pseudonormalization). The average left ventricular global longitudinal  strain is -21.8 %. The global longitudinal strain is normal.   2. Right ventricular systolic function is normal. The right ventricular  size is normal. Tricuspid regurgitation signal is inadequate for assessing  PA pressure.   3. The mitral valve is normal in structure. Trivial mitral valve  regurgitation. No evidence of mitral stenosis.   4. The aortic valve is tricuspid. Aortic valve regurgitation is trivial.  No aortic stenosis is present.   5. Aortic dilatation noted. There  is mild dilatation of the ascending  aorta, measuring 41 mm.   6. The inferior vena cava is normal in size with greater than 50%  respiratory variability, suggesting right atrial pressure of 3 mmHg.   Comparison(s): Compared to prior echo in 03/2018, there is no significant  change.   EKG:  EKG is ordered today.  The ekg ordered today demonstrates atrial fibrillation with RVR  Recent Labs: 03/18/2021: ALT 59; BUN 15; Creatinine, Ser 0.89; Hemoglobin 14.2; Platelets 225; Potassium 4.4; Sodium 139; TSH 1.130  Recent Lipid Panel    Component Value Date/Time   CHOL 134 03/18/2021 1225   TRIG 114 03/18/2021 1225   HDL 44 03/18/2021 1225   CHOLHDL 3.0 03/18/2021 1225   CHOLHDL 2.9 11/22/2014 0856   VLDL 29 11/22/2014 0856   LDLCALC 69 03/18/2021 1225     Risk Assessment/Calculations:    CHA2DS2-VASc Score = 3   This indicates a 3.2% annual risk of stroke. The patient's score is based upon: CHF History: 0 HTN History: 1 Diabetes History: 0 Stroke History: 0 Vascular Disease History: 1 Age Score: 1 Gender Score: 0          Physical Exam:    VS:  BP 110/70   Pulse (!) 137   Ht '5\' 6"'$  (1.676 m)   Wt 219 lb 12.8 oz (99.7 kg)   SpO2 95%   BMI 35.48 kg/m     Wt Readings from Last 3 Encounters:  09/11/21 219  lb 12.8 oz (99.7 kg)  05/06/21 222 lb (100.7 kg)  03/18/21 220 lb 12.8 oz (100.2 kg)     GEN:  Well nourished, well developed in no acute distress HEENT: Normal NECK: No JVD; No carotid bruits LYMPHATICS: No lymphadenopathy CARDIAC: Irregularly irregular, tachycardic, no murmurs, rubs, gallops RESPIRATORY:  Clear to auscultation without rales, wheezing or rhonchi  ABDOMEN: Soft, non-tender, non-distended MUSCULOSKELETAL:  No edema; No deformity  SKIN: Warm and dry NEUROLOGIC:  Alert and oriented x 3 PSYCHIATRIC:  Normal affect   ASSESSMENT:    1. Atrial fibrillation with RVR ([place])   2. Preprocedural examination   3. Coronary artery disease involving native coronary artery of native heart without angina pectoris   4. Primary hypertension   5. Hyperlipidemia LDL goal <70    PLAN:    In order of problems listed above:  Atrial fibrillation with RVR: Case discussed with both DOD Dr. Percival Spanish and the patient.  We will proceed with outpatient cardioversion.  Heart rate is in the 130s in the office, he has been in A-fib with RVR for the past month.  We will increase metoprolol tartrate temporarily to 75 mg twice a day.  He has a history of bradycardia in sinus rhythm on higher dose of rate control medication, in the morning of the cardioversion, he will go back to 50 mg twice a day of metoprolol thereafter.  We will refer the patient to EP service after the cardioversion.  He has 2 previous episodes of ablation, however his old EP physician has moved to [place], he would be interested in another ablation and wished to discuss with EP service.  Once he completed cardioversion, we can consider repeating echocardiogram on the next visit, given how long he has been in A-fib, I would not be too surprised if his EF is now lower.  CAD: Denies any recent chest pain  Hypertension: Blood pressure stable  Hyperlipidemia: On Lipitor      Shared Decision Making/Informed Consent The risks  (stroke,  cardiac arrhythmias rarely resulting in the need for a temporary or permanent pacemaker, skin irritation or burns and complications associated with conscious sedation including aspiration, arrhythmia, respiratory failure and death), benefits (restoration of normal sinus rhythm) and alternatives of a direct current cardioversion were explained in detail to Timothy [name] and he agrees to proceed.      Medication Adjustments/Labs and Tests Ordered: Current medicines are reviewed at length with the patient today.  Concerns regarding medicines are outlined above.  Orders Placed This Encounter  Procedures   Basic metabolic panel   CBC   Ambulatory referral to Cardiac Electrophysiology   EKG 12-Lead   Meds ordered this encounter  Medications   DISCONTD: metoprolol tartrate 75 MG TABS    Sig: Take 75 mg by mouth 2 (two) times daily. PLEASE TAKE '50mg'$  (1) TABLET TWICE DAILY    Dispense:  180 tablet    Refill:  3    Dose change new Rx   metoprolol tartrate (LOPRESSOR) 50 MG tablet    Sig: Take 1 tablet (50 mg total) by mouth 2 (two) times daily.    Dispense:  180 tablet    Refill:  3    Patient Instructions  Medication Instructions:  INCREASE Metoprolol Tartrate to 75 mg 2 times a day temporarily, then resume previous dose 50 mg 2 times a day on the day.  *If you need a refill on your cardiac medications before your next appointment, please call your pharmacy*  Lab Work: Your physician recommends that you return for lab work the morning of Cardioversion:  BMET CBC  If you have labs (blood work) drawn today and your tests are completely normal, you will receive your results only by: MyChart Message (if you have MyChart) OR A paper copy in the mail If you have any lab test that is abnormal or we need to change your treatment, we will call you to review the results.  Testing/Procedures: You have been referred to Cardiac Electrophysiology   Your physician has recommended that you  have a Cardioversion (DCCV). Electrical Cardioversion uses a jolt of electricity to your heart either through paddles or wired patches attached to your chest. This is a controlled, usually prescheduled, procedure. Defibrillation is done under light anesthesia in the hospital, and you usually go home the day of the procedure. This is done to get your heart back into a normal rhythm. You are not awake for the procedure. Please see the instruction sheet given to you today.   Follow-Up: At [facility], you and your health needs are our priority.  As part of our continuing mission to provide you with exceptional heart care, we have created designated Provider Care Teams.  These Care Teams include your primary Cardiologist (physician) and Advanced Practice Providers (APPs -  Physician Assistants and Nurse Practitioners) who all work together to provide you with the care you need, when you need it.   Your next appointment:   2 weeks after Cardioversion  The format for your next appointment:   In Person  Provider:   Almyra Deforest, PA-C        Other Instructions Dear Timothy [name],  You are scheduled for a TEE/Cardioversion/TEE Cardioversion on Monday 09/15/21 with Dr. Marlou Porch.  Please arrive at the [facility] (Main Entrance A) at [facility]: [address] [place], [place] [address] at 9:00 AM. (1 hour prior to procedure unless lab work is needed; if lab work is needed arrive 1.5 hours ahead)  DIET: Nothing to  eat or drink after midnight except a sip of water with medications (see medication instructions below)  FYI: For your safety, and to allow [place] to monitor your vital signs accurately during the surgery/procedure we request that   if you have artificial nails, gel coating, SNS etc. Please have those removed prior to your surgery/procedure. Not having the nail coverings /polish removed may result in cancellation or delay of your surgery/procedure.   Medication Instructions:  Continue  your anticoagulant: Xarelto  You will need to continue your anticoagulant after your procedure until you  are told by your  Provider that it is safe to stop   Labs: If patient is on Coumadin, patient needs pt/INR, CBC, BMET within 3 days (No pt/INR needed for patients taking Xarelto, Eliquis, Pradaxa) For patients receiving anesthesia for TEE and all Cardioversion patients: BMET, CBC within 1 week  Your lab work will be done at the hospital prior to your procedure - you will need to arrive 1  hours ahead of your procedure  You must have a responsible person to drive you home and stay in the waiting area during your procedure. Failure to do so could result in cancellation.  Bring your insurance cards.  *Special Note: Every effort is made to have your procedure done on time. Occasionally there are emergencies that occur at the hospital that may cause delays. Please be patient if a delay does occur.   Important Information About Sugar         Hilbert Corrigan, [place]  09/13/2021 12:02 AM    [place]

## 2021-09-12 ENCOUNTER — Telehealth: Payer: Self-pay | Admitting: Physician Assistant

## 2021-09-12 ENCOUNTER — Encounter: Payer: Self-pay | Admitting: Physician Assistant

## 2021-09-12 ENCOUNTER — Telehealth: Payer: Self-pay

## 2021-09-12 MED ORDER — METOPROLOL TARTRATE 50 MG PO TABS: 50.0000 mg | ORAL_TABLET | Freq: Two times a day (BID) | ORAL | 3 refills | Status: DC

## 2021-09-12 NOTE — Telephone Encounter (Signed)
Dear Timothy [name],  You are scheduled for a TEE/Cardioversion/TEE Cardioversion on Monday 09/15/21 with Dr. Marlou Porch.  Please arrive at the [facility] (Main Entrance A) at [facility]: [address] [place], [place] [address] at 9:00 AM. (1 hour prior to procedure unless lab work is needed; if lab work is needed arrive 1.5 hours ahead)  DIET: Nothing to eat or drink after midnight except a sip of water with medications (see medication instructions below)  FYI: For your safety, and to allow [place] to monitor your vital signs accurately during the surgery/procedure we request that   if you have artificial nails, gel coating, SNS etc. Please have those removed prior to your surgery/procedure. Not having the nail coverings /polish removed may result in cancellation or delay of your surgery/procedure.   Medication Instructions:  Continue your anticoagulant: Xarelto  You will need to continue your anticoagulant after your procedure until you  are told by your  Provider that it is safe to stop   Labs: If patient is on Coumadin, patient needs pt/INR, CBC, BMET within 3 days (No pt/INR needed for patients taking Xarelto, Eliquis, Pradaxa) For patients receiving anesthesia for TEE and all Cardioversion patients: BMET, CBC within 1 week  Your lab work will be done at the hospital prior to your procedure - you will need to arrive 1  hours ahead of your procedure  You must have a responsible person to drive you home and stay in the waiting area during your procedure. Failure to do so could result in cancellation.  Bring your insurance cards.  *Special Note: Every effort is made to have your procedure done on time. Occasionally there are emergencies that occur at the hospital that may cause delays. Please be patient if a delay does occur.

## 2021-09-12 NOTE — Telephone Encounter (Signed)
Patient is called stating he was told to take metoprolol tartrate 2 '50mg'$  tablets and 1 '25mg'$  tablet to equal '75mg'$ , 2x a day.  The pharmacy at [facility] told them they have he should be taking 50 mg 2x a day.  He needs to know what he should be taking.

## 2021-09-13 ENCOUNTER — Other Ambulatory Visit: Payer: Self-pay | Admitting: Physician Assistant

## 2021-09-13 DIAGNOSIS — I4819 Other persistent atrial fibrillation: Secondary | ICD-10-CM

## 2021-09-13 NOTE — Telephone Encounter (Signed)
I have called and clarified the metoprolol dose. What we have decided during office visit is still unchanged. Patient was to increase metoprolol temporarily to '75mg'$  BID to give him better rate control. This is a temporary change as patient has a h/o bradycardia on higher dose of beta blocker. I previously informed the patient I want him to go back down to '50mg'$  BID starting in the morning of cardioversion forward. I think the '50mg'$  BID prescription was sent to the pharmacy because the '75mg'$  BID dosing was only supposed to be temporary. I have called and spoke with Timothy [name] just now and clarified the medication usage. No further need for you guys to call him. Thank you. Timothy [name]

## 2021-09-15 ENCOUNTER — Encounter (HOSPITAL_COMMUNITY)
Admission: RE | Disposition: A | Discharge status: Home / Self Care | Payer: Self-pay | Source: Home / Self Care | Attending: Cardiology

## 2021-09-15 ENCOUNTER — Ambulatory Visit (HOSPITAL_COMMUNITY): Payer: Medicare Other | Admitting: Certified Registered"

## 2021-09-15 ENCOUNTER — Telehealth: Payer: Self-pay | Admitting: Physician Assistant

## 2021-09-15 ENCOUNTER — Ambulatory Visit (HOSPITAL_BASED_OUTPATIENT_CLINIC_OR_DEPARTMENT_OTHER): Payer: Medicare Other | Admitting: Certified Registered"

## 2021-09-15 ENCOUNTER — Other Ambulatory Visit: Payer: Self-pay

## 2021-09-15 ENCOUNTER — Ambulatory Visit (HOSPITAL_COMMUNITY)
Admission: RE | Admit: 2021-09-15 | Discharge: 2021-09-15 | Disposition: A | Discharge status: Home / Self Care | Payer: Medicare Other | Attending: Cardiology | Admitting: Cardiology

## 2021-09-15 DIAGNOSIS — I2510 Atherosclerotic heart disease of native coronary artery without angina pectoris: Secondary | ICD-10-CM | POA: Diagnosis not present

## 2021-09-15 DIAGNOSIS — Z79899 Other long term (current) drug therapy: Secondary | ICD-10-CM | POA: Diagnosis not present

## 2021-09-15 DIAGNOSIS — Z87891 Personal history of nicotine dependence: Secondary | ICD-10-CM | POA: Insufficient documentation

## 2021-09-15 DIAGNOSIS — I10 Essential (primary) hypertension: Secondary | ICD-10-CM

## 2021-09-15 DIAGNOSIS — I4891 Unspecified atrial fibrillation: Secondary | ICD-10-CM | POA: Diagnosis not present

## 2021-09-15 DIAGNOSIS — I4819 Other persistent atrial fibrillation: Secondary | ICD-10-CM | POA: Diagnosis not present

## 2021-09-15 DIAGNOSIS — E785 Hyperlipidemia, unspecified: Secondary | ICD-10-CM | POA: Insufficient documentation

## 2021-09-15 DIAGNOSIS — I495 Sick sinus syndrome: Secondary | ICD-10-CM | POA: Diagnosis not present

## 2021-09-15 HISTORY — PX: CARDIOVERSION: SHX1299

## 2021-09-15 SURGERY — CARDIOVERSION
Anesthesia: General

## 2021-09-15 MED ORDER — LIDOCAINE 2% (20 MG/ML) 5 ML SYRINGE
INTRAMUSCULAR | Status: DC | PRN
  Administered 2021-09-15: 20 mg via INTRAVENOUS

## 2021-09-15 MED ORDER — PROPOFOL 10 MG/ML IV BOLUS
INTRAVENOUS | Status: DC | PRN
  Administered 2021-09-15: 80 mg via INTRAVENOUS

## 2021-09-15 MED ORDER — SODIUM CHLORIDE 0.9 % IV SOLN: INTRAVENOUS | Status: DC

## 2021-09-15 NOTE — CV Procedure (Signed)
    Electrical Cardioversion Procedure Note Timothy [name] [id number] May 28, 1945  Procedure: Electrical Cardioversion Indications:  Atrial Fibrillation  Time Out: Verified patient identification, verified procedure,medications/allergies/relevent history reviewed, required imaging and test results available.  Performed  Procedure Details  The patient was NPO after midnight. Anesthesia was administered at the beside  by Dr.R Timothy [name] with  propofol.  Cardioversion was performed with synchronized biphasic defibrillation via AP pads with 200 joules.  3 attempt(s) were performed with towel compression on chest wall.  The patient did not convert. The patient tolerated the procedure well   IMPRESSION:  Unsuccessful cardioversion of atrial fibrillation Prior afib ablations (7 cardioversions)   Timothy [name] 09/15/2021, 10:05 AM

## 2021-09-15 NOTE — Transfer of Care (Signed)
Immediate Anesthesia Transfer of Care Note  Patient: Timothy [name]  Procedure(s) Performed: CARDIOVERSION  Patient Location: PACU  Anesthesia Type:MAC  Level of Consciousness: drowsy and patient cooperative  Airway & Oxygen Therapy: Patient Spontanous Breathing  Post-op Assessment: Report given to RN and Post -op Vital signs reviewed and stable  Post vital signs: Reviewed and stable  Last Vitals:  Vitals Value Taken Time  BP    Temp    Pulse    Resp    SpO2      Last Pain:  Vitals:   09/15/21 0855  TempSrc: Temporal  PainSc: 0-No pain         Complications: No notable events documented.

## 2021-09-15 NOTE — Discharge Instructions (Signed)

## 2021-09-15 NOTE — Anesthesia Postprocedure Evaluation (Signed)
Anesthesia Post Note  Patient: Timothy [name]  Procedure(s) Performed: CARDIOVERSION     Patient location during evaluation: PACU Anesthesia Type: General Level of consciousness: awake and alert Pain management: pain level controlled Vital Signs Assessment: post-procedure vital signs reviewed and stable Respiratory status: spontaneous breathing, nonlabored ventilation, respiratory function stable and patient connected to nasal cannula oxygen Cardiovascular status: blood pressure returned to baseline and stable Postop Assessment: no apparent nausea or vomiting Anesthetic complications: no   No notable events documented.  Last Vitals:  Vitals:   09/15/21 1011 09/15/21 1020  BP: 123/85 128/88  Pulse: 97 99  Resp: (!) 21 18  Temp:    SpO2: 94% 96%    Last Pain:  Vitals:   09/15/21 1020  TempSrc:   PainSc: 0-No pain                 Tiajuana Amass

## 2021-09-15 NOTE — Interval H&P Note (Signed)
History and Physical Interval Note:  09/15/2021 9:37 AM  Timothy [name]  has presented today for surgery, with the diagnosis of afib.  The various methods of treatment have been discussed with the patient and family. After consideration of risks, benefits and other options for treatment, the patient has consented to  Procedure(s): CARDIOVERSION (N/A) as a surgical intervention.  The patient's history has been reviewed, patient examined, no change in status, stable for surgery.  I have reviewed the patient's chart and labs.  Questions were answered to the patient's satisfaction.     [employer]

## 2021-09-15 NOTE — Telephone Encounter (Signed)
Pt c/o medication issue:  1. Name of Medication: metoprolol tartrate (LOPRESSOR) 50 MG tablet  2. How are you currently taking this medication (dosage and times per day)?   3. Are you having a reaction (difficulty breathing--STAT)?   4. What is your medication issue? Amy, Pharmacist at [facility] would like clarification as to how the patient needs to be taking this medication

## 2021-09-15 NOTE — Anesthesia Preprocedure Evaluation (Signed)
Anesthesia Evaluation  Patient identified by MRN, date of birth, ID band Patient awake    Reviewed: Allergy & Precautions, NPO status , Patient's Chart, lab work & pertinent test results  Airway Mallampati: II  TM Distance: >3 FB Neck ROM: Full    Dental   Pulmonary former smoker,    breath sounds clear to auscultation       Cardiovascular hypertension, Pt. on medications and Pt. on home beta blockers + CAD and + Cardiac Stents  + dysrhythmias Atrial Fibrillation  Rhythm:Irregular Rate:Normal     Neuro/Psych negative neurological ROS     GI/Hepatic negative GI ROS, Neg liver ROS,   Endo/Other  negative endocrine ROS  Renal/GU negative Renal ROS     Musculoskeletal   Abdominal   Peds  Hematology negative hematology ROS (+)   Anesthesia Other Findings   Reproductive/Obstetrics                             Anesthesia Physical Anesthesia Plan  ASA: 3  Anesthesia Plan: General   Post-op Pain Management: Minimal or no pain anticipated   Induction: Intravenous  PONV Risk Score and Plan: 2 and Treatment may vary due to age or medical condition  Airway Management Planned: Mask and Natural Airway  Additional Equipment:   Intra-op Plan:   Post-operative Plan:   Informed Consent: I have reviewed the patients History and Physical, chart, labs and discussed the procedure including the risks, benefits and alternatives for the proposed anesthesia with the patient or authorized representative who has indicated his/her understanding and acceptance.       Plan Discussed with:   Anesthesia Plan Comments:         Anesthesia Quick Evaluation

## 2021-09-16 ENCOUNTER — Encounter (HOSPITAL_COMMUNITY): Payer: Self-pay | Admitting: Cardiology

## 2021-09-29 ENCOUNTER — Encounter: Payer: Self-pay | Admitting: Physician Assistant

## 2021-09-29 ENCOUNTER — Ambulatory Visit (INDEPENDENT_AMBULATORY_CARE_PROVIDER_SITE_OTHER): Payer: Medicare Other | Admitting: Physician Assistant

## 2021-09-29 VITALS — BP 128/82 | HR 109 | Ht 66.0 in | Wt 221.2 lb

## 2021-09-29 DIAGNOSIS — I2510 Atherosclerotic heart disease of native coronary artery without angina pectoris: Secondary | ICD-10-CM

## 2021-09-29 DIAGNOSIS — E785 Hyperlipidemia, unspecified: Secondary | ICD-10-CM

## 2021-09-29 DIAGNOSIS — I10 Essential (primary) hypertension: Secondary | ICD-10-CM

## 2021-09-29 DIAGNOSIS — I4819 Other persistent atrial fibrillation: Secondary | ICD-10-CM | POA: Diagnosis not present

## 2021-09-29 MED ORDER — METOPROLOL TARTRATE 100 MG PO TABS: 100.0000 mg | ORAL_TABLET | Freq: Two times a day (BID) | ORAL | 3 refills | Status: DC

## 2021-09-29 NOTE — Patient Instructions (Signed)
Medication Instructions:  Your physician has recommended you make the following change in your medication INCREASE: Metoprolol Tartrate '100mg'$  TWICE DAILY  *If you need a refill on your cardiac medications before your next appointment, please call your pharmacy*   Lab Work: NONE If you have labs (blood work) drawn today and your tests are completely normal, you will receive your results only by: [place] (if you have MyChart) OR A paper copy in the mail If you have any lab test that is abnormal or we need to change your treatment, we will call you to review the results.   Testing/Procedures: Your physician has requested that you have an echocardiogram. Echocardiography is a painless test that uses sound waves to create images of your heart. It provides your doctor with information about the size and shape of your heart and how well your heart's chambers and valves are working. This procedure takes approximately one hour. There are no restrictions for this procedure.    Follow-Up: At [facility], you and your health needs are our priority.  As part of our continuing mission to provide you with exceptional heart care, we have created designated Provider Care Teams.  These Care Teams include your primary Cardiologist (physician) and Advanced Practice Providers (APPs -  Physician Assistants and Nurse Practitioners) who all work together to provide you with the care you need, when you need it.  Other Instructions Keep upcoming appointment with Dr. Curt Bears

## 2021-09-29 NOTE — Progress Notes (Unsigned)
Cardiology Office Note:    Date:  10/01/2021   ID:  Timothy [name], DOB [birth year], MRN [id number]  PCP:  Street, [place], [place] Providers Cardiologist:  Shelva Majestic, MD     Referring MD: Street, [place], *   Chief Complaint  Patient presents with   Follow-up    Seen for Dr. Claiborne Billings    History of Present Illness:    Timothy [name] is a 76 y.o. male with a hx of CAD, hypertension, hyperlipidemia, sick sinus syndrome and PAF.  Patient underwent stenting of his LAD in 1999.  Myoview in May 2013 was low risk.  Echocardiogram at the time showed EF 55 to 60%, mild left atrial dilatation.  He was on amiodarone remotely however developed skin toxicity secondary to the medication.  He has undergone 2 radiofrequency ablation at [facility] by Dr. Ola Spurr in February 2009 and December 2009.  Echocardiogram in Aug 02, 2020 showed EF 60 to 65%, mild LVH, grade 1 DD.  He was found to have mild obstructive sleep apnea based on the previous sleep study in June 2022.  Patient was last seen by Dr. Gretta Began in December 2022 due to elevated heart rate.  By the time he was seen he was already back in sinus rhythm metoprolol tartrate was increased to 50 mg twice a day.  I saw the patient on 09/11/2021 for follow-up, he reported that he has not went back to atrial fibrillation since the end of May.  His heart rate was elevated in the 130s.  Since he had persistent A-fib for over a month, we recommended proceed with urgent cardioversion.  I temporarily increase his metoprolol to 75 mg twice a day with instruction of going back to 50 mg twice a day after the cardioversion.  I also referred him to EP service to consider potentially redo ablation.  He presented for cardioversion on 7/3, 3 unsuccessful attempts were made however patient did not convert.  Patient presents today for follow-up.  He continues to have chest discomfort and fatigue but no significant shortness of  breath.  Chest discomfort is related to atrial fibrillation.  He also has been having more dizzy spell after increasing metoprolol to 75 mg twice a day.  I decided to stop his valsartan and increase metoprolol further to 100 mg twice a day for better rate control.  Heart rate today is 109 which is better than the previous 137, however the current heart rate is still not ideal.  After increasing the metoprolol, I suspect his heart rate will be better, at which time we will obtain a echocardiogram prior to his visit with Dr. Curt Bears.  I do not think he can hold normal rhythm without antiarrhythmic therapy or ablation at this point.  The only antiarrhythmic therapy that may help hold normal rhythm is likely Tikosyn.  Past Medical History:  Diagnosis Date   Atrial fibrillation ([place])    CAD (coronary artery disease)    LAD stent in 1999   Hyperlipidemia    Hypertension    PAF (paroxysmal atrial fibrillation) ([place])    cardioversion in 2013   SSS (sick sinus syndrome) [facility])     Past Surgical History:  Procedure Laterality Date   CARDIAC ELECTROPHYSIOLOGY STUDY AND ABLATION  2008, 2009   RF ablation (x4) [facility] - Dr. Ola Spurr)   CARDIOVERSION  06/15/2011   Procedure: CARDIOVERSION;  Surgeon: Pixie Casino, MD;  Location: [place];  Service: Cardiovascular;  Laterality: N/A;   CARDIOVERSION N/A 09/15/2021   Procedure: CARDIOVERSION;  Surgeon: Jerline Pain, MD;  Location: [place];  Service: Cardiovascular;  Laterality: N/A;   CORONARY ANGIOPLASTY WITH STENT PLACEMENT  10/29/1997   .0x20 self-expanding radius stent to LAD (Dr. Marella Chimes)   [place]  07/2011   bruce myoview; partially fixed apical defect, worse at rest than stress; fixed inferobasal diaphragmatic attenuation artifact; no reversible ischemia; post-stress EF 60%; short run of NSVT; low risk scan    TRANSTHORACIC ECHOCARDIOGRAM  06/13/2011   EF 55-60%, mod conc LVH; LA mildly dilated    Current  Medications: Current Meds  Medication Sig   acetaminophen (TYLENOL) 650 MG CR tablet Take 1,300 mg by mouth every 8 (eight) hours as needed for pain.   aspirin 81 MG chewable tablet Chew 81 mg by mouth daily.   atorvastatin (LIPITOR) 40 MG tablet TAKE ONE TABLET BY MOUTH EVERYDAY AT BEDTIME   Cholecalciferol (VITAMIN D) 50 MCG (2000 UT) tablet Take 2,000 Units by mouth daily.   esomeprazole (NEXIUM) 20 MG capsule Take 20 mg by mouth daily.   Garlic 4166 MG CAPS Take 1 capsule by mouth daily.   metoprolol tartrate (LOPRESSOR) 100 MG tablet Take 1 tablet (100 mg total) by mouth 2 (two) times daily.   Omega-3 Fatty Acids (OMEGA 3 PO) Take 1,000 mg by mouth.    rivaroxaban (XARELTO) 20 MG TABS tablet TAKE ONE TABLET BY MOUTH EVERYDAY AT BEDTIME   [DISCONTINUED] metoprolol tartrate (LOPRESSOR) 25 MG tablet Take 25 mg by mouth 2 (two) times daily. Take with 50 mg to equal 75 mg twice daily   [DISCONTINUED] metoprolol tartrate (LOPRESSOR) 50 MG tablet Take 1 tablet (50 mg total) by mouth 2 (two) times daily. (Patient taking differently: Take 50 mg by mouth 2 (two) times daily. Take with 25 mg to equal 75 mg twice daily)   [DISCONTINUED] valsartan (DIOVAN) 80 MG tablet TAKE ONE TABLET BY MOUTH EVERY MORNING     Allergies:   Nitroglycerin   Social History   Socioeconomic History   Marital status: Married    Spouse name: Not on file   Number of children: 3   Years of education: Not on file   Highest education level: Not on file  Occupational History   Not on file  Tobacco Use   Smoking status: Former    Packs/day: 1.00    Types: Cigarettes    Quit date: 04/07/1975    Years since quitting: 46.5   Smokeless tobacco: Former   Tobacco comments:    quit smoking 1973. quit smokeless about 30 years ago.  Substance and Sexual Activity   Alcohol use: Yes   Drug use: No   Sexual activity: Not on file  Other Topics Concern   Not on file  Social History Narrative   Not on file   Social  Determinants of Health   Financial Resource Strain: Not on file  Food Insecurity: Not on file  Transportation Needs: Not on file  Physical Activity: Not on file  Stress: Not on file  Social Connections: Not on file     Family History: The patient's family history includes Atrial fibrillation in his child; Coronary artery disease in his father; Diabetes in his mother; Hypertension in his child, father, and sister.  ROS:   Please see the history of present illness.     All other systems reviewed and are negative.  EKGs/Labs/Other Studies Reviewed:  The following studies were reviewed today:  Echo 07/17/2020  1. Left ventricular ejection fraction, by estimation, is 60 to 65%. The  left ventricle has normal function. The left ventricle has no regional  wall motion abnormalities. There is mild concentric left ventricular  hypertrophy. Left ventricular diastolic  parameters are consistent with Grade II diastolic dysfunction  (pseudonormalization). The average left ventricular global longitudinal  strain is -21.8 %. The global longitudinal strain is normal.   2. Right ventricular systolic function is normal. The right ventricular  size is normal. Tricuspid regurgitation signal is inadequate for assessing  PA pressure.   3. The mitral valve is normal in structure. Trivial mitral valve  regurgitation. No evidence of mitral stenosis.   4. The aortic valve is tricuspid. Aortic valve regurgitation is trivial.  No aortic stenosis is present.   5. Aortic dilatation noted. There is mild dilatation of the ascending  aorta, measuring 41 mm.   6. The inferior vena cava is normal in size with greater than 50%  respiratory variability, suggesting right atrial pressure of 3 mmHg.   Comparison(s): Compared to prior echo in 03/2018, there is no significant  change.   EKG:  EKG is ordered today.  The ekg ordered today demonstrates atrial fibrillation, heart rate borderline controlled at  109  Recent Labs: 03/18/2021: ALT 59; BUN 15; Creatinine, Ser 0.89; Hemoglobin 14.2; Platelets 225; Potassium 4.4; Sodium 139; TSH 1.130  Recent Lipid Panel    Component Value Date/Time   CHOL 134 03/18/2021 1225   TRIG 114 03/18/2021 1225   HDL 44 03/18/2021 1225   CHOLHDL 3.0 03/18/2021 1225   CHOLHDL 2.9 11/22/2014 0856   VLDL 29 11/22/2014 0856   LDLCALC 69 03/18/2021 1225     Risk Assessment/Calculations:    CHA2DS2-VASc Score = 3   This indicates a 3.2% annual risk of stroke. The patient's score is based upon: CHF History: 0 HTN History: 1 Diabetes History: 0 Stroke History: 0 Vascular Disease History: 1 Age Score: 1 Gender Score: 0          Physical Exam:    VS:  BP 128/82   Pulse (!) 109   Ht '5\' 6"'$  (1.676 m)   Wt 221 lb 3.2 oz (100.3 kg)   SpO2 97%   BMI 35.70 kg/m        Wt Readings from Last 3 Encounters:  09/29/21 221 lb 3.2 oz (100.3 kg)  09/15/21 214 lb (97.1 kg)  09/11/21 219 lb 12.8 oz (99.7 kg)     GEN:  Well nourished, well developed in no acute distress HEENT: Normal NECK: No JVD; No carotid bruits LYMPHATICS: No lymphadenopathy CARDIAC: Irregularly irregular, no murmurs, rubs, gallops RESPIRATORY:  Clear to auscultation without rales, wheezing or rhonchi  ABDOMEN: Soft, non-tender, non-distended MUSCULOSKELETAL:  No edema; No deformity  SKIN: Warm and dry NEUROLOGIC:  Alert and oriented x 3 PSYCHIATRIC:  Normal affect   ASSESSMENT:    1. Persistent atrial fibrillation ([place])   2. Coronary artery disease involving native coronary artery of native heart without angina pectoris   3. Primary hypertension   4. Hyperlipidemia LDL goal <70    PLAN:    In order of problems listed above:  Persistent atrial fibrillation: Heart rate remain borderline controlled, will increase metoprolol further to 100 mg twice a day and stop valsartan.  Recent cardioversion has failed to restore sinus rhythm.  He is intolerant of amiodarone due to skin  reaction.  He has upcoming visit with EP  service to consider alternative options.  Obtain echocardiogram to look at [facility] and left atrial size  CAD: Denies any chest pain  Hypertension: Blood pressure controlled.  Stop valsartan and start on 100 mg twice a day of metoprolol  Hyperlipidemia: On Lipitor           Medication Adjustments/Labs and Tests Ordered: Current medicines are reviewed at length with the patient today.  Concerns regarding medicines are outlined above.  Orders Placed This Encounter  Procedures   EKG 12-Lead   ECHOCARDIOGRAM COMPLETE   Meds ordered this encounter  Medications   metoprolol tartrate (LOPRESSOR) 100 MG tablet    Sig: Take 1 tablet (100 mg total) by mouth 2 (two) times daily.    Dispense:  180 tablet    Refill:  3    Patient Instructions  Medication Instructions:  Your physician has recommended you make the following change in your medication INCREASE: Metoprolol Tartrate '100mg'$  TWICE DAILY  *If you need a refill on your cardiac medications before your next appointment, please call your pharmacy*   Lab Work: NONE If you have labs (blood work) drawn today and your tests are completely normal, you will receive your results only by: [place] (if you have MyChart) OR A paper copy in the mail If you have any lab test that is abnormal or we need to change your treatment, we will call you to review the results.   Testing/Procedures: Your physician has requested that you have an echocardiogram. Echocardiography is a painless test that uses sound waves to create images of your heart. It provides your doctor with information about the size and shape of your heart and how well your heart's chambers and valves are working. This procedure takes approximately one hour. There are no restrictions for this procedure.    Follow-Up: At [facility], you and your health needs are our priority.  As part of our continuing mission to provide you with  exceptional heart care, we have created designated Provider Care Teams.  These Care Teams include your primary Cardiologist (physician) and Advanced Practice Providers (APPs -  Physician Assistants and Nurse Practitioners) who all work together to provide you with the care you need, when you need it.  Other Instructions Keep upcoming appointment with Dr. Curt Bears    Signed, Almyra Deforest, [place]  10/01/2021 1:39 PM    [place]

## 2021-10-01 ENCOUNTER — Encounter: Payer: Self-pay | Admitting: Physician Assistant

## 2021-10-10 ENCOUNTER — Ambulatory Visit (HOSPITAL_COMMUNITY): Payer: Medicare Other | Attending: Internal Medicine

## 2021-10-10 DIAGNOSIS — I4819 Other persistent atrial fibrillation: Secondary | ICD-10-CM

## 2021-10-10 LAB — ECHOCARDIOGRAM COMPLETE: S' Lateral: 3.7 cm

## 2021-10-26 ENCOUNTER — Other Ambulatory Visit: Payer: Self-pay | Admitting: Cardiovascular Disease

## 2021-10-29 ENCOUNTER — Encounter: Payer: Self-pay | Admitting: *Deleted

## 2021-10-29 ENCOUNTER — Encounter: Payer: Self-pay | Admitting: Cardiology

## 2021-10-29 ENCOUNTER — Ambulatory Visit (INDEPENDENT_AMBULATORY_CARE_PROVIDER_SITE_OTHER): Payer: Medicare Other | Admitting: Cardiology

## 2021-10-29 VITALS — BP 124/78 | HR 85 | Ht 66.0 in | Wt 223.6 lb

## 2021-10-29 DIAGNOSIS — Z01812 Encounter for preprocedural laboratory examination: Secondary | ICD-10-CM | POA: Diagnosis not present

## 2021-10-29 DIAGNOSIS — I4819 Other persistent atrial fibrillation: Secondary | ICD-10-CM

## 2021-10-29 MED ORDER — DILTIAZEM HCL ER COATED BEADS 180 MG PO CP24: 180.0000 mg | ORAL_CAPSULE | Freq: Every day | ORAL | 6 refills | Status: DC

## 2021-10-29 NOTE — Patient Instructions (Addendum)
Medication Instructions:  Your physician has recommended you make the following change in your medication:  START Diltiazem 180 mg once daily   *If you need a refill on your cardiac medications before your next appointment, please call your pharmacy*   Lab Work: Pre procedure labs -- see procedure instruction letter:  BMP & CBC  If you have labs (blood work) drawn today and your tests are completely normal, you will receive your results only by: [place] (if you have MyChart) OR A paper copy in the mail If you have any lab test that is abnormal or we need to change your treatment, we will call you to review the results.   Testing/Procedures: Your physician has requested that you have cardiac CT within 7 days PRIOR to your ablation. Cardiac computed tomography (CT) is a painless test that uses an x-ray machine to take clear, detailed pictures of your heart.  Please follow instruction below located under "other instructions". You will get a call from our office to schedule the date for this test.  Your physician has recommended that you have an ablation. Catheter ablation is a medical procedure used to treat some cardiac arrhythmias (irregular heartbeats). During catheter ablation, a long, thin, flexible tube is put into a blood vessel in your groin (upper thigh), or neck. This tube is called an ablation catheter. It is then guided to your heart through the blood vessel. Radio frequency waves destroy small areas of heart tissue where abnormal heartbeats may cause an arrhythmia to start. Please follow instruction letter given to you today.   Follow-Up: At [facility], you and your health needs are our priority.  As part of our continuing mission to provide you with exceptional heart care, we have created designated Provider Care Teams.  These Care Teams include your primary Cardiologist (physician) and Advanced Practice Providers (APPs -  Physician Assistants and Nurse Practitioners)  who all work together to provide you with the care you need, when you need it.   Your next appointment:   1 month(s) after your ablation  The format for your next appointment:   In Person  Provider:   [facility] clinic   Thank you for choosing [facility]!!   Trinidad Curet, RN [phone]    Other Instructions  Cardiac Ablation Cardiac ablation is a procedure to destroy (ablate) some heart tissue that is sending bad signals. These bad signals cause problems in heart rhythm. The heart has many areas that make these signals. If there are problems in these areas, they can make the heart beat in a way that is not normal. Destroying some tissues can help make the heart rhythm normal. Tell your doctor about: Any allergies you have. All medicines you are taking. These include vitamins, herbs, eye drops, creams, and over-the-counter medicines. Any problems you or family members have had with medicines that make you fall asleep (anesthetics). Any blood disorders you have. Any surgeries you have had. Any medical conditions you have, such as kidney failure. Whether you are pregnant or may be pregnant. What are the risks? This is a safe procedure. But problems may occur, including: Infection. Bruising and bleeding. Bleeding into the chest. Stroke or blood clots. Damage to nearby areas of your body. Allergies to medicines or dyes. The need for a pacemaker if the normal system is damaged. Failure of the procedure to treat the problem. What happens before the procedure? Medicines Ask your doctor about: Changing or stopping your normal medicines. This is important. Taking  aspirin and ibuprofen. Do not take these medicines unless your doctor tells you to take them. Taking other medicines, vitamins, herbs, and supplements. General instructions Follow instructions from your doctor about what you cannot eat or drink. Plan to have someone take you home from the hospital or clinic. If  you will be going home right after the procedure, plan to have someone with you for 24 hours. Ask your doctor what steps will be taken to prevent infection. What happens during the procedure?  An IV tube will be put into one of your veins. You will be given a medicine to help you relax. The skin on your neck or groin will be numbed. A cut (incision) will be made in your neck or groin. A needle will be put through your cut and into a large vein. A tube (catheter) will be put into the needle. The tube will be moved to your heart. Dye may be put through the tube. This helps your doctor see your heart. Small devices (electrodes) on the tube will send out signals. A type of energy will be used to destroy some heart tissue. The tube will be taken out. Pressure will be held on your cut. This helps stop bleeding. A bandage will be put over your cut. The exact procedure may vary among doctors and hospitals. What happens after the procedure? You will be watched until you leave the hospital or clinic. This includes checking your heart rate, breathing rate, oxygen, and blood pressure. Your cut will be watched for bleeding. You will need to lie still for a few hours. Do not drive for 24 hours or as long as your doctor tells you. Summary Cardiac ablation is a procedure to destroy some heart tissue. This is done to treat heart rhythm problems. Tell your doctor about any medical conditions you may have. Tell him or her about all medicines you are taking to treat them. This is a safe procedure. But problems may occur. These include infection, bruising, bleeding, and damage to nearby areas of your body. Follow what your doctor tells you about food and drink. You may also be told to change or stop some of your medicines. After the procedure, do not drive for 24 hours or as long as your doctor tells you. This information is not intended to replace advice given to you by your health care provider. Make sure you  discuss any questions you have with your health care provider. Document Revised: 05/23/2021 Document Reviewed: 02/02/2019 Elsevier Patient Education  [place].

## 2021-10-29 NOTE — Progress Notes (Signed)
Electrophysiology Office Note   Date:  10/29/2021   ID:  Timothy [name], DOB 31-Jan-1946, MRN [id number]  PCP:  Street, Sharon Mt, MD  Cardiologist:  Claiborne Billings Primary Electrophysiologist:  Nathaneal Sommers Meredith Leeds, MD    Chief Complaint: AF   History of Present Illness: Timothy [name] is a 76 y.o. male who is being seen today for the evaluation of AF at the request of Almyra Deforest, [place]. Presenting today for electrophysiology evaluation.  The history significant for coronary artery disease, hypertension, hyperlipidemia, sick sinus syndrome, atrial fibrillation.  He had an LAD stent in 2019.  He was previously on amiodarone but developed skin toxicity.  He had ablation x2 at [facility] by Dr. Ola Spurr in 2009.  He has obstructive sleep apnea.  June 2023 was found to be in atrial fibrillation.  He had a cardioversion 10/05/2021.  At that time, he had 3 unsuccessful attempts at cardioversion.  Symptoms are fatigue and mild chest discomfort.  Today, he denies symptoms of palpitations, shortness of breath, orthopnea, PND, lower extremity edema, claudication, dizziness, presyncope, syncope, bleeding, or neurologic sequela. The patient is tolerating medications without difficulties.    Past Medical History:  Diagnosis Date   Atrial fibrillation ([place])    CAD (coronary artery disease)    LAD stent in 1999   Hyperlipidemia    Hypertension    PAF (paroxysmal atrial fibrillation) ([place])    cardioversion in 2013   SSS (sick sinus syndrome) [facility])    Past Surgical History:  Procedure Laterality Date   CARDIAC ELECTROPHYSIOLOGY STUDY AND ABLATION  2008, 2009   RF ablation (x4) [facility] - Dr. Ola Spurr)   CARDIOVERSION  06/15/2011   Procedure: CARDIOVERSION;  Surgeon: Pixie Casino, MD;  Location: [place];  Service: Cardiovascular;  Laterality: N/A;   CARDIOVERSION N/A 09/15/2021   Procedure: CARDIOVERSION;  Surgeon: Jerline Pain, MD;  Location: [place];  Service:  Cardiovascular;  Laterality: N/A;   CORONARY ANGIOPLASTY WITH STENT PLACEMENT  10/29/1997   .0x20 self-expanding radius stent to LAD (Dr. Marella Chimes)   [place]  07/2011   bruce myoview; partially fixed apical defect, worse at rest than stress; fixed inferobasal diaphragmatic attenuation artifact; no reversible ischemia; post-stress EF 60%; short run of NSVT; low risk scan    TRANSTHORACIC ECHOCARDIOGRAM  06/13/2011   EF 55-60%, mod conc LVH; LA mildly dilated     Current Outpatient Medications  Medication Sig Dispense Refill   aspirin 81 MG chewable tablet Chew 81 mg by mouth daily.     atorvastatin (LIPITOR) 40 MG tablet TAKE ONE TABLET BY MOUTH EVERYDAY AT BEDTIME 90 tablet 3   Cholecalciferol (VITAMIN D) 50 MCG (2000 UT) tablet Take 2,000 Units by mouth daily.     esomeprazole (NEXIUM) 20 MG capsule Take 20 mg by mouth daily.     Garlic 1093 MG CAPS Take 1 capsule by mouth daily.     metoprolol tartrate (LOPRESSOR) 100 MG tablet Take 1 tablet (100 mg total) by mouth 2 (two) times daily. 180 tablet 3   Omega-3 Fatty Acids (OMEGA 3 PO) Take 1,000 mg by mouth.      rivaroxaban (XARELTO) 20 MG TABS tablet TAKE ONE TABLET BY MOUTH EVERYDAY AT BEDTIME 30 tablet 2   acetaminophen (TYLENOL) 650 MG CR tablet Take 1,300 mg by mouth every 8 (eight) hours as needed for pain. (Patient not taking: Reported on 10/29/2021)     No current facility-administered medications for this visit.  Allergies:   Nitroglycerin   Social History:  The patient  reports that he quit smoking about 46 years ago. His smoking use included cigarettes. He smoked an average of 1 pack per day. He has quit using smokeless tobacco. He reports current alcohol use. He reports that he does not use drugs.   Family History:  The patient's family history includes Atrial fibrillation in his child; Coronary artery disease in his father; Diabetes in his mother; Hypertension in his child, father, and sister.     ROS:  Please see the history of present illness.   Otherwise, review of systems is positive for none.   All other systems are reviewed and negative.    PHYSICAL EXAM: VS:  BP 124/78   Pulse 85   Ht '5\' 6"'$  (1.676 m)   Wt 223 lb 9.6 oz (101.4 kg)   SpO2 98%   BMI 36.09 kg/m  , BMI Body mass index is 36.09 kg/m. GEN: Well nourished, well developed, in no acute distress  HEENT: normal  Neck: no JVD, carotid bruits, or masses Cardiac: RRR; no murmurs, rubs, or gallops,no edema  Respiratory:  clear to auscultation bilaterally, normal work of breathing GI: soft, nontender, nondistended, + BS MS: no deformity or atrophy  Skin: warm and dry Neuro:  Strength and sensation are intact Psych: euthymic mood, full affect  EKG:  EKG is not ordered today. Personal review of the ekg ordered 09/29/21 shows atrial fibrillation, rate 109  Recent Labs: 03/18/2021: ALT 59; BUN 15; Creatinine, Ser 0.89; Hemoglobin 14.2; Platelets 225; Potassium 4.4; Sodium 139; TSH 1.130    Lipid Panel     Component Value Date/Time   CHOL 134 03/18/2021 1225   TRIG 114 03/18/2021 1225   HDL 44 03/18/2021 1225   CHOLHDL 3.0 03/18/2021 1225   CHOLHDL 2.9 11/22/2014 0856   VLDL 29 11/22/2014 0856   LDLCALC 69 03/18/2021 1225     Wt Readings from Last 3 Encounters:  10/29/21 223 lb 9.6 oz (101.4 kg)  09/29/21 221 lb 3.2 oz (100.3 kg)  09/15/21 214 lb (97.1 kg)      Other studies Reviewed: Additional studies/ records that were reviewed today include: TTE 10/10/21  Review of the above records today demonstrates:   1. Left ventricular ejection fraction, by estimation, is 45-50% with beat  to beat variability. The left ventricle has mildly decreased function.  Left ventricular endocardial border not optimally defined to evaluate  regional wall motion. There is mild  left ventricular hypertrophy. Left ventricular diastolic parameters are  indeterminate.   2. Right ventricular systolic function is mildly  reduced. The right  ventricular size is normal. There is normal pulmonary artery systolic  pressure. The estimated right ventricular systolic pressure is 78.2 mmHg.   3. Left atrial size was mildly dilated.   4. The mitral valve is normal in structure. Mild mitral valve  regurgitation. No evidence of mitral stenosis.   5. The aortic valve is grossly normal. There is mild thickening of the  aortic valve. Aortic valve regurgitation is trivial. No aortic stenosis is  present.   6. The inferior vena cava is normal in size with greater than 50%  respiratory variability, suggesting right atrial pressure of 3 mmHg.    ASSESSMENT AND PLAN:  1.  Persistent atrial fibrillation: CHA2DS2-VASc of at least 3.  Currently on Xarelto 20 mg daily, metoprolol 100 mg twice daily.  He feels quite poorly in atrial fibrillation and would prefer a rhythm control strategy.  He was intolerant of amiodarone due to skin reaction.  He feels quite poorly in atrial fibrillation.  He would prefer a rate control strategy.  He has tried multiple different medications and would prefer to avoid these.  We Timothy [name] plan for ablation.  Risk, benefits, and alternatives to EP study and radiofrequency ablation for afib were also discussed in detail today. These risks include but are not limited to stroke, bleeding, vascular damage, tamponade, perforation, damage to the esophagus, lungs, and other structures, pulmonary vein stenosis, worsening renal function, and death. The patient understands these risk and wishes to proceed.  We Timothy [name] therefore proceed with catheter ablation at the next available time.  Carto, ICE, anesthesia are requested for the procedure.  Timothy [name] also obtain CT PV protocol prior to the procedure to exclude LAA thrombus and further evaluate atrial anatomy.   2.  Coronary artery disease: No current chest pain  3.  Hypertension: well controlled  4.  Hyperlipidemia: Continue Lipitor per primary cardiology  5.  Secondary  hypercoagulable state: Currently on Xarelto for atrial fibrillation as above  6.  Obstructive sleep apnea: Has trouble with CPAP.  We Timothy [name] contact his sleep medicine physician as he Kash [place] need to be more compliant to stay in rhythm.  Current medicines are reviewed at length with the patient today.   The patient does not have concerns regarding his medicines.  The following changes were made today:  none  Labs/ tests ordered today include:  Orders Placed This Encounter  Procedures   CT CARDIAC MORPH/PULM VEIN W/CM&W/O CA SCORE   Basic metabolic panel   CBC     Disposition:   FU with Timothy [name] 3 months  Signed, Timothy Peeks Meredith Leeds, MD  10/29/2021 2:45 PM     [place] [address] [place] [address] [phone] (office) [phone] (fax)

## 2021-11-13 DIAGNOSIS — E785 Hyperlipidemia, unspecified: Secondary | ICD-10-CM | POA: Diagnosis not present

## 2021-11-13 DIAGNOSIS — I119 Hypertensive heart disease without heart failure: Secondary | ICD-10-CM | POA: Diagnosis not present

## 2021-11-13 DIAGNOSIS — I480 Paroxysmal atrial fibrillation: Secondary | ICD-10-CM | POA: Diagnosis not present

## 2022-01-25 ENCOUNTER — Other Ambulatory Visit: Payer: Self-pay | Admitting: Cardiovascular Disease

## 2022-01-26 DIAGNOSIS — G4730 Sleep apnea, unspecified: Secondary | ICD-10-CM | POA: Diagnosis not present

## 2022-01-26 DIAGNOSIS — Z6836 Body mass index (BMI) 36.0-36.9, adult: Secondary | ICD-10-CM | POA: Diagnosis not present

## 2022-01-26 DIAGNOSIS — J329 Chronic sinusitis, unspecified: Secondary | ICD-10-CM | POA: Diagnosis not present

## 2022-01-26 DIAGNOSIS — J40 Bronchitis, not specified as acute or chronic: Secondary | ICD-10-CM | POA: Diagnosis not present

## 2022-01-26 NOTE — Telephone Encounter (Signed)
Prescription refill request for Xarelto received.  Indication:afib Last office visit:8/23 Weight:101.4 kg Age:76 Scr:0.8 CrCl:116.19  ml/min  Prescription refilled

## 2022-01-28 DIAGNOSIS — I4819 Other persistent atrial fibrillation: Secondary | ICD-10-CM | POA: Diagnosis not present

## 2022-01-28 DIAGNOSIS — Z01812 Encounter for preprocedural laboratory examination: Secondary | ICD-10-CM | POA: Diagnosis not present

## 2022-01-29 LAB — BASIC METABOLIC PANEL
BUN/Creatinine Ratio: 15 (ref 10–24)
BUN: 15 mg/dL (ref 8–27)
CO2: 21 mmol/L (ref 20–29)
Calcium: 9.1 mg/dL (ref 8.6–10.2)
Chloride: 105 mmol/L (ref 96–106)
Creatinine, Ser: 1.01 mg/dL (ref 0.76–1.27)
Glucose: 126 mg/dL — ABNORMAL HIGH (ref 70–99)
Potassium: 4.4 mmol/L (ref 3.5–5.2)
Sodium: 141 mmol/L (ref 134–144)
eGFR: 78 mL/min/{1.73_m2} (ref >59)

## 2022-01-29 LAB — CBC
Hematocrit: 42 % (ref 37.5–51.0)
Hemoglobin: 14.4 g/dL (ref 13.0–17.7)
MCH: 31.2 pg (ref 26.6–33.0)
MCHC: 34.3 g/dL (ref 31.5–35.7)
MCV: 91 fL (ref 79–97)
Platelets: 234 10*3/uL (ref 150–450)
RBC: 4.62 x10E6/uL (ref 4.14–5.80)
RDW: 13.2 % (ref 11.6–15.4)
WBC: 7.6 10*3/uL (ref 3.4–10.8)

## 2022-02-11 ENCOUNTER — Telehealth (HOSPITAL_COMMUNITY): Payer: Self-pay | Admitting: Emergency Medicine

## 2022-02-12 ENCOUNTER — Ambulatory Visit (HOSPITAL_BASED_OUTPATIENT_CLINIC_OR_DEPARTMENT_OTHER)
Admission: RE | Admit: 2022-02-12 | Discharge: 2022-02-12 | Disposition: A | Discharge status: Home / Self Care | Payer: Medicare Other | Source: Ambulatory Visit | Attending: Cardiology | Admitting: Cardiology

## 2022-02-12 ENCOUNTER — Encounter (HOSPITAL_BASED_OUTPATIENT_CLINIC_OR_DEPARTMENT_OTHER): Payer: Self-pay

## 2022-02-12 DIAGNOSIS — I4819 Other persistent atrial fibrillation: Secondary | ICD-10-CM | POA: Diagnosis not present

## 2022-02-12 MED ORDER — IOHEXOL 350 MG/ML SOLN
100.0000 mL | Freq: Once | INTRAVENOUS | Status: AC | PRN
  Administered 2022-02-12: 80 mL via INTRAVENOUS

## 2022-02-12 NOTE — Telephone Encounter (Signed)
Reaching out to patient to offer assistance regarding upcoming cardiac imaging study; pt verbalizes understanding of appt date/time, parking situation and where to check in, pre-test NPO status and medications ordered, and verified current allergies; name and call back number provided for further questions should they arise Akshita Italiano RN Navigator Cardiac Imaging  Heart and Vascular [phone] office [phone] cell 

## 2022-02-19 NOTE — Pre-Procedure Instructions (Signed)
Instructed patient on the following items: Arrival time 1100 Nothing to eat or drink after midnight No meds AM of procedure Responsible person to drive you home and stay with you for 24 hrs  Have you missed any doses of anti-coagulant Xarelto- hasn't missed any doses    

## 2022-02-20 ENCOUNTER — Ambulatory Visit (HOSPITAL_COMMUNITY)
Admission: AD | Admit: 2022-02-20 | Discharge: 2022-02-20 | Disposition: A | Discharge status: Home / Self Care | Payer: Medicare Other | Source: Ambulatory Visit | Attending: Cardiology | Admitting: Cardiology

## 2022-02-20 ENCOUNTER — Ambulatory Visit (HOSPITAL_BASED_OUTPATIENT_CLINIC_OR_DEPARTMENT_OTHER): Payer: Medicare Other | Admitting: Certified Registered"

## 2022-02-20 ENCOUNTER — Ambulatory Visit (HOSPITAL_BASED_OUTPATIENT_CLINIC_OR_DEPARTMENT_OTHER): Payer: Medicare Other

## 2022-02-20 ENCOUNTER — Other Ambulatory Visit: Payer: Self-pay

## 2022-02-20 ENCOUNTER — Encounter (HOSPITAL_COMMUNITY)
Admission: AD | Disposition: A | Discharge status: Home / Self Care | Payer: Self-pay | Source: Ambulatory Visit | Attending: Cardiology

## 2022-02-20 ENCOUNTER — Ambulatory Visit (HOSPITAL_COMMUNITY): Payer: Medicare Other | Admitting: Certified Registered"

## 2022-02-20 DIAGNOSIS — I10 Essential (primary) hypertension: Secondary | ICD-10-CM

## 2022-02-20 DIAGNOSIS — Z87891 Personal history of nicotine dependence: Secondary | ICD-10-CM | POA: Insufficient documentation

## 2022-02-20 DIAGNOSIS — I083 Combined rheumatic disorders of mitral, aortic and tricuspid valves: Secondary | ICD-10-CM | POA: Diagnosis not present

## 2022-02-20 DIAGNOSIS — I4819 Other persistent atrial fibrillation: Secondary | ICD-10-CM

## 2022-02-20 DIAGNOSIS — E785 Hyperlipidemia, unspecified: Secondary | ICD-10-CM | POA: Insufficient documentation

## 2022-02-20 DIAGNOSIS — I495 Sick sinus syndrome: Secondary | ICD-10-CM | POA: Diagnosis not present

## 2022-02-20 DIAGNOSIS — I513 Intracardiac thrombosis, not elsewhere classified: Secondary | ICD-10-CM | POA: Diagnosis not present

## 2022-02-20 DIAGNOSIS — I4891 Unspecified atrial fibrillation: Secondary | ICD-10-CM

## 2022-02-20 DIAGNOSIS — G4733 Obstructive sleep apnea (adult) (pediatric): Secondary | ICD-10-CM | POA: Insufficient documentation

## 2022-02-20 DIAGNOSIS — Z955 Presence of coronary angioplasty implant and graft: Secondary | ICD-10-CM | POA: Insufficient documentation

## 2022-02-20 DIAGNOSIS — I2510 Atherosclerotic heart disease of native coronary artery without angina pectoris: Secondary | ICD-10-CM | POA: Diagnosis not present

## 2022-02-20 DIAGNOSIS — I340 Nonrheumatic mitral (valve) insufficiency: Secondary | ICD-10-CM | POA: Insufficient documentation

## 2022-02-20 HISTORY — PX: ATRIAL FIBRILLATION ABLATION: EP1191

## 2022-02-20 HISTORY — PX: TEE WITHOUT CARDIOVERSION: SHX5443

## 2022-02-20 SURGERY — ATRIAL FIBRILLATION ABLATION
Anesthesia: General

## 2022-02-20 MED ORDER — HEPARIN SODIUM (PORCINE) 1000 UNIT/ML IJ SOLN
INTRAMUSCULAR | Status: AC
  Filled 2022-02-20: qty 10

## 2022-02-20 MED ORDER — LABETALOL HCL 5 MG/ML IV SOLN
INTRAVENOUS | Status: DC | PRN
  Administered 2022-02-20 (×2): 5 mg via INTRAVENOUS

## 2022-02-20 MED ORDER — MIDAZOLAM HCL 2 MG/2ML IJ SOLN
INTRAMUSCULAR | Status: DC | PRN
  Administered 2022-02-20: 2 mg via INTRAVENOUS

## 2022-02-20 MED ORDER — HEPARIN (PORCINE) IN NACL 1000-0.9 UT/500ML-% IV SOLN
INTRAVENOUS | Status: AC
  Filled 2022-02-20: qty 500

## 2022-02-20 MED ORDER — LIDOCAINE 2% (20 MG/ML) 5 ML SYRINGE
INTRAMUSCULAR | Status: DC | PRN
  Administered 2022-02-20: 60 mg via INTRAVENOUS

## 2022-02-20 MED ORDER — ROCURONIUM BROMIDE 10 MG/ML (PF) SYRINGE
PREFILLED_SYRINGE | INTRAVENOUS | Status: DC | PRN
  Administered 2022-02-20: 60 mg via INTRAVENOUS

## 2022-02-20 MED ORDER — APIXABAN 5 MG PO TABS: 5.0000 mg | ORAL_TABLET | Freq: Two times a day (BID) | ORAL | 0 refills | Status: DC

## 2022-02-20 MED ORDER — ACETAMINOPHEN 500 MG PO TABS
1000.0000 mg | ORAL_TABLET | Freq: Once | ORAL | Status: AC
  Administered 2022-02-20: 1000 mg via ORAL
  Filled 2022-02-20: qty 2

## 2022-02-20 MED ORDER — ONDANSETRON HCL 4 MG/2ML IJ SOLN
INTRAMUSCULAR | Status: DC | PRN
  Administered 2022-02-20: 4 mg via INTRAVENOUS

## 2022-02-20 MED ORDER — APIXABAN 5 MG PO TABS: 5.0000 mg | ORAL_TABLET | Freq: Two times a day (BID) | ORAL | 3 refills | Status: DC

## 2022-02-20 MED ORDER — PHENYLEPHRINE 80 MCG/ML (10ML) SYRINGE FOR IV PUSH (FOR BLOOD PRESSURE SUPPORT)
PREFILLED_SYRINGE | INTRAVENOUS | Status: DC | PRN
  Administered 2022-02-20: 80 ug via INTRAVENOUS

## 2022-02-20 MED ORDER — DEXAMETHASONE SODIUM PHOSPHATE 10 MG/ML IJ SOLN
INTRAMUSCULAR | Status: DC | PRN
  Administered 2022-02-20: 10 mg via INTRAVENOUS

## 2022-02-20 MED ORDER — SODIUM CHLORIDE 0.9 % IV SOLN: INTRAVENOUS | Status: DC

## 2022-02-20 MED ORDER — PROPOFOL 10 MG/ML IV BOLUS
INTRAVENOUS | Status: DC | PRN
  Administered 2022-02-20: 50 mg via INTRAVENOUS
  Administered 2022-02-20: 150 mg via INTRAVENOUS

## 2022-02-20 MED ORDER — FENTANYL CITRATE (PF) 250 MCG/5ML IJ SOLN
INTRAMUSCULAR | Status: DC | PRN
  Administered 2022-02-20: 100 ug via INTRAVENOUS

## 2022-02-20 SURGICAL SUPPLY — 3 items
MAT PREVALON FULL STRYKER (MISCELLANEOUS) IMPLANT
PAD DEFIB RADIO PHYSIO CONN (PAD) ×1 IMPLANT
PATCH CARTO3 (PAD) IMPLANT

## 2022-02-20 NOTE — Anesthesia Postprocedure Evaluation (Signed)
Anesthesia Post Note  Patient: Timothy [name]  Procedure(s) Performed: ATRIAL FIBRILLATION ABLATION TRANSESOPHAGEAL ECHOCARDIOGRAM (TEE)     Patient location during evaluation: Cath Lab Anesthesia Type: General Level of consciousness: awake and alert Pain management: pain level controlled Vital Signs Assessment: post-procedure vital signs reviewed and stable Respiratory status: spontaneous breathing, nonlabored ventilation and respiratory function stable Cardiovascular status: blood pressure returned to baseline and stable Postop Assessment: no apparent nausea or vomiting Anesthetic complications: no  There were no known notable events for this encounter.  Last Vitals:  Vitals:   02/20/22 1351 02/20/22 1426  BP: 131/85 129/83  Pulse: 93 (!) 108  Resp: 16 14  Temp: 36.8 C 36.8 C  SpO2: 94%     Last Pain:  Vitals:   02/20/22 1426  TempSrc: Temporal  PainSc:                  Nabor Thomann,W. EDMOND

## 2022-02-20 NOTE — CV Procedure (Signed)
    TRANSESOPHAGEAL ECHOCARDIOGRAM   NAME:  Timothy [name]   MRN: [id number] DOB:  March 24, 1945   ADMIT DATE: 02/20/2022  INDICATIONS:   PROCEDURE:   Informed consent was obtained prior to the procedure. The risks, benefits and alternatives for the procedure were discussed and the patient comprehended these risks.  Risks include, but are not limited to, cough, sore throat, vomiting, nausea, somnolence, esophageal and stomach trauma or perforation, bleeding, low blood pressure, aspiration, pneumonia, infection, trauma to the teeth and death.    The patient was under general anesthesia on the cath lab table. After a procedural time-out, the transesophageal probe was inserted in the esophagus and stomach without difficulty and multiple views were obtained.    COMPLICATIONS:    There were no immediate complications.  FINDINGS:  LEFT VENTRICLE: EF = 50%. No regional wall motion abnormalities.  RIGHT VENTRICLE: Normal size and function.   LEFT ATRIUM: Severely dilated. LA diameter 6.0cm  LEFT ATRIAL APPENDAGE: + thrombus.   RIGHT ATRIUM: Moderately dilated  AORTIC VALVE:  Trileaflet. Trivial AI  MITRAL VALVE:    Normal. Mild MR  TRICUSPID VALVE: Normal.Mild TR  PULMONIC VALVE: Grossly normal. Trvial PI  INTERATRIAL SEPTUM: No PFO or ASD.  PERICARDIUM: No effusion  DESCENDING AORTA: Moderate plaque   Amair Shrout,MD 2:07 PM

## 2022-02-20 NOTE — Discharge Instructions (Signed)
Post procedure care instructions No driving for 4 days. No lifting over 5 lbs for 1 week. No vigorous or sexual activity for 1 week. You may return to work/your usual activities on 02/28/22. Keep procedure site clean & dry. If you notice increased pain, swelling, bleeding or pus, call/return!  You may shower after 24 hours, but no soaking in baths/hot tubs/pools for 1 week.    You have an appointment set up with the [facility].  Multiple studies have shown that being followed by a dedicated atrial fibrillation clinic in addition to the standard care you receive from your other physicians improves health. We believe that enrollment in the atrial fibrillation clinic will allow [place] to better care for you.   The phone number to the [facility] is [phone]. The clinic is staffed Monday through Friday from 8:30am to 5pm.  Directions: The clinic is located in the [facility], [place] the hospital at the MAIN ENTRANCE "A", use [address] to the 6th floor.  Registration desk to the right of elevators on 6th floor  If you have any trouble locating the clinic, please don't hesitate to call [phone].

## 2022-02-20 NOTE — Anesthesia Procedure Notes (Signed)
Procedure Name: Intubation Date/Time: 02/20/2022 12:59 PM  Performed by: Anastasio Auerbach, CRNAPre-anesthesia Checklist: Patient identified, Emergency Drugs available, Suction available and Patient being monitored Patient Re-evaluated:Patient Re-evaluated prior to induction Oxygen Delivery Method: Circle system utilized Preoxygenation: Pre-oxygenation with 100% oxygen Induction Type: IV induction Ventilation: Mask ventilation without difficulty Laryngoscope Size: Mac, 3, Glidescope and 4 Grade View: Grade II Tube type: Oral Tube size: 7.5 mm Number of attempts: 1 Airway Equipment and Method: Stylet and Oral airway Placement Confirmation: ETT inserted through vocal cords under direct vision, positive ETCO2 and breath sounds checked- equal and bilateral Secured at: 22 cm Tube secured with: Tape Dental Injury: Teeth and Oropharynx as per pre-operative assessment  Difficulty Due To: Difficult Airway- due to anterior larynx and Difficult Airway- due to large tongue Comments: EZMV.  Attempted intubation X 1 with MAC 3 and only view was tip of epiglottis- did not attempt tp pass ETT.  Ezmv and Switched to glidescope #4. Grade 2 view and ETT easily passed.

## 2022-02-20 NOTE — Progress Notes (Signed)
  Echocardiogram Echocardiogram Transesophageal has been performed.  Timothy [name] 02/20/2022, 2:13 PM

## 2022-02-20 NOTE — Transfer of Care (Signed)
Immediate Anesthesia Transfer of Care Note  Patient: Timothy [name]  Procedure(s) Performed: ATRIAL FIBRILLATION ABLATION TRANSESOPHAGEAL ECHOCARDIOGRAM (TEE)  Patient Location: Cath Lab  Anesthesia Type:General  Level of Consciousness: awake, alert , and oriented  Airway & Oxygen Therapy: Patient Spontanous Breathing and Patient connected to nasal cannula oxygen  Post-op Assessment: Report given to RN and Post -op Vital signs reviewed and stable  Post vital signs: Reviewed and stable  Last Vitals:  Vitals Value Taken Time  BP 131/85 02/20/22 1351  Temp 36.8 C 02/20/22 1351  Pulse 93 02/20/22 1351  Resp 16 02/20/22 1351  SpO2 94 % 02/20/22 1351    Last Pain:  Vitals:   02/20/22 1351  TempSrc: Temporal  PainSc: 0-No pain         Complications: There were no known notable events for this encounter.

## 2022-02-20 NOTE — H&P (Signed)
Electrophysiology Office Note   Date:  02/20/2022   ID:  Timothy [name], DOB 1945-10-22, MRN [id number]  PCP:  Street, Sharon Mt, MD  Cardiologist:  Claiborne Billings Primary Electrophysiologist:  Devarius Nelles Meredith Leeds, MD    Chief Complaint: AF   History of Present Illness: Timothy [name] is a 76 y.o. male who is being seen today for the evaluation of AF at the request of No ref. provider found. Presenting today for electrophysiology evaluation.  The history significant for coronary artery disease, hypertension, hyperlipidemia, sick sinus syndrome, atrial fibrillation.  He had an LAD stent in 2019.  He was previously on amiodarone but developed skin toxicity.  He had ablation x2 at [facility] by Dr. Ola Spurr in 2009.  He has obstructive sleep apnea.  June 2023 was found to be in atrial fibrillation.  He had a cardioversion 10/05/2021.  At that time, he had 3 unsuccessful attempts at cardioversion.  Today, denies symptoms of palpitations, chest pain, shortness of breath, orthopnea, PND, lower extremity edema, claudication, dizziness, presyncope, syncope, bleeding, or neurologic sequela. The patient is tolerating medications without difficulties. Plan ablation today.    Past Medical History:  Diagnosis Date   Atrial fibrillation ([place])    CAD (coronary artery disease)    LAD stent in 1999   Hyperlipidemia    Hypertension    PAF (paroxysmal atrial fibrillation) ([place])    cardioversion in 2013   SSS (sick sinus syndrome) [facility])    Past Surgical History:  Procedure Laterality Date   CARDIAC ELECTROPHYSIOLOGY STUDY AND ABLATION  2008, 2009   RF ablation (x4) [facility] - Dr. Ola Spurr)   CARDIOVERSION  06/15/2011   Procedure: CARDIOVERSION;  Surgeon: Pixie Casino, MD;  Location: [place];  Service: Cardiovascular;  Laterality: N/A;   CARDIOVERSION N/A 09/15/2021   Procedure: CARDIOVERSION;  Surgeon: Jerline Pain, MD;  Location: [place];  Service: Cardiovascular;   Laterality: N/A;   CORONARY ANGIOPLASTY WITH STENT PLACEMENT  10/29/1997   .0x20 self-expanding radius stent to LAD (Dr. Marella Chimes)   [place]  07/2011   bruce myoview; partially fixed apical defect, worse at rest than stress; fixed inferobasal diaphragmatic attenuation artifact; no reversible ischemia; post-stress EF 60%; short run of NSVT; low risk scan    TRANSTHORACIC ECHOCARDIOGRAM  06/13/2011   EF 55-60%, mod conc LVH; LA mildly dilated     Current Facility-Administered Medications  Medication Dose Route Frequency Provider Last Rate Last Admin   0.9 %  sodium chloride infusion   Intravenous Continuous Constance Haw, MD 50 mL/hr at 02/20/22 1157 New Bag at 02/20/22 1157    Allergies:   Nitroglycerin   Social History:  The patient  reports that he quit smoking about 46 years ago. His smoking use included cigarettes. He smoked an average of 1 pack per day. He has quit using smokeless tobacco. He reports current alcohol use. He reports that he does not use drugs.   Family History:  The patient's family history includes Atrial fibrillation in his child; Coronary artery disease in his father; Diabetes in his mother; Hypertension in his child, father, and sister.   ROS:  Please see the history of present illness.   Otherwise, review of systems is positive for none.   All other systems are reviewed and negative.   PHYSICAL EXAM: VS:  Pulse 90   Temp (!) 97.2 F (36.2 C) (Temporal)   Resp 17   Ht '5\' 6"'$  (1.676 m)  Wt 102.1 kg   SpO2 97%   BMI 36.32 kg/m  , BMI Body mass index is 36.32 kg/m. GEN: Well nourished, well developed, in no acute distress  HEENT: normal  Neck: no JVD, carotid bruits, or masses Cardiac: irregular; no murmurs, rubs, or gallops,no edema  Respiratory:  clear to auscultation bilaterally, normal work of breathing GI: soft, nontender, nondistended, + BS MS: no deformity or atrophy  Skin: warm and dry Neuro:  Strength and sensation are  intact Psych: euthymic mood, full affect    Recent Labs: 03/18/2021: ALT 59; TSH 1.130 01/28/2022: BUN 15; Creatinine, Ser 1.01; Hemoglobin 14.4; Platelets 234; Potassium 4.4; Sodium 141    Lipid Panel     Component Value Date/Time   CHOL 134 03/18/2021 1225   TRIG 114 03/18/2021 1225   HDL 44 03/18/2021 1225   CHOLHDL 3.0 03/18/2021 1225   CHOLHDL 2.9 11/22/2014 0856   VLDL 29 11/22/2014 0856   LDLCALC 69 03/18/2021 1225     Wt Readings from Last 3 Encounters:  02/20/22 102.1 kg  10/29/21 101.4 kg  09/29/21 100.3 kg      Other studies Reviewed: Additional studies/ records that were reviewed today include: TTE 10/10/21  Review of the above records today demonstrates:   1. Left ventricular ejection fraction, by estimation, is 45-50% with beat  to beat variability. The left ventricle has mildly decreased function.  Left ventricular endocardial border not optimally defined to evaluate  regional wall motion. There is mild  left ventricular hypertrophy. Left ventricular diastolic parameters are  indeterminate.   2. Right ventricular systolic function is mildly reduced. The right  ventricular size is normal. There is normal pulmonary artery systolic  pressure. The estimated right ventricular systolic pressure is 33.2 mmHg.   3. Left atrial size was mildly dilated.   4. The mitral valve is normal in structure. Mild mitral valve  regurgitation. No evidence of mitral stenosis.   5. The aortic valve is grossly normal. There is mild thickening of the  aortic valve. Aortic valve regurgitation is trivial. No aortic stenosis is  present.   6. The inferior vena cava is normal in size with greater than 50%  respiratory variability, suggesting right atrial pressure of 3 mmHg.    ASSESSMENT AND PLAN:  1.  Persistent atrial fibrillation: Timothy [name] has presented today for surgery, with the diagnosis of AF.  The various methods of treatment have been discussed with the patient  and family. After consideration of risks, benefits and other options for treatment, the patient has consented to  Procedure(s): Catheter ablation as a surgical intervention .  Risks include but not limited to complete heart block, stroke, esophageal damage, nerve damage, bleeding, vascular damage, tamponade, perforation, MI, and death. The patient's history has been reviewed, patient examined, no change in status, stable for surgery.  I have reviewed the patient's chart and labs.  Questions were answered to the patient's satisfaction.    Harlow Basley Curt Bears, MD 02/20/2022 11:58 AM

## 2022-02-20 NOTE — Anesthesia Preprocedure Evaluation (Addendum)
Anesthesia Evaluation  Patient identified by MRN, date of birth, ID band Patient awake    Reviewed: Allergy & Precautions, H&P , NPO status , Patient's Chart, lab work & pertinent test results  Airway Mallampati: III  TM Distance: >3 FB Neck ROM: Full    Dental no notable dental hx. (+) Teeth Intact, Dental Advisory Given   Pulmonary former smoker   Pulmonary exam normal breath sounds clear to auscultation       Cardiovascular hypertension, Pt. on medications and Pt. on home beta blockers + CAD  + dysrhythmias Atrial Fibrillation  Rhythm:Irregular Rate:Tachycardia     Neuro/Psych negative neurological ROS  negative psych ROS   GI/Hepatic negative GI ROS, Neg liver ROS,,,  Endo/Other  negative endocrine ROS    Renal/GU negative Renal ROS  negative genitourinary   Musculoskeletal   Abdominal   Peds  Hematology negative hematology ROS (+)   Anesthesia Other Findings   Reproductive/Obstetrics negative OB ROS                             Anesthesia Physical Anesthesia Plan  ASA: 3  Anesthesia Plan: General   Post-op Pain Management: Tylenol PO (pre-op)*   Induction: Intravenous  PONV Risk Score and Plan: 3 and Ondansetron, Dexamethasone and Treatment may vary due to age or medical condition  Airway Management Planned: Oral ETT  Additional Equipment:   Intra-op Plan:   Post-operative Plan: Extubation in OR  Informed Consent: I have reviewed the patients History and Physical, chart, labs and discussed the procedure including the risks, benefits and alternatives for the proposed anesthesia with the patient or authorized representative who has indicated his/her understanding and acceptance.     Dental advisory given  Plan Discussed with: CRNA  Anesthesia Plan Comments:        Anesthesia Quick Evaluation

## 2022-02-20 NOTE — Interval H&P Note (Signed)
History and Physical Interval Note:  02/20/2022 2:06 PM  Timothy [name]  has presented today for surgery, with the diagnosis of afib.  The various methods of treatment have been discussed with the patient and family. After consideration of risks, benefits and other options for treatment, the patient has consented to  Procedure(s): ATRIAL FIBRILLATION ABLATION (N/A) TRANSESOPHAGEAL ECHOCARDIOGRAM (TEE) (N/A) as a surgical intervention.  The patient's history has been reviewed, patient examined, no change in status, stable for surgery.  I have reviewed the patient's chart and labs.  Questions were answered to the patient's satisfaction.     [name]

## 2022-02-23 ENCOUNTER — Encounter (HOSPITAL_COMMUNITY): Payer: Self-pay | Admitting: Cardiology

## 2022-03-23 ENCOUNTER — Encounter (HOSPITAL_COMMUNITY): Payer: Self-pay | Admitting: Physician Assistant

## 2022-03-23 ENCOUNTER — Ambulatory Visit (HOSPITAL_COMMUNITY)
Admission: RE | Admit: 2022-03-23 | Discharge: 2022-03-23 | Disposition: A | Discharge status: Home / Self Care | Payer: Medicare Other | Source: Ambulatory Visit | Attending: Physician Assistant | Admitting: Physician Assistant

## 2022-03-23 VITALS — BP 130/74 | HR 84 | Ht 66.0 in | Wt 232.2 lb

## 2022-03-23 DIAGNOSIS — E669 Obesity, unspecified: Secondary | ICD-10-CM | POA: Insufficient documentation

## 2022-03-23 DIAGNOSIS — G4733 Obstructive sleep apnea (adult) (pediatric): Secondary | ICD-10-CM | POA: Diagnosis not present

## 2022-03-23 DIAGNOSIS — Z79899 Other long term (current) drug therapy: Secondary | ICD-10-CM | POA: Diagnosis not present

## 2022-03-23 DIAGNOSIS — E785 Hyperlipidemia, unspecified: Secondary | ICD-10-CM | POA: Insufficient documentation

## 2022-03-23 DIAGNOSIS — I4819 Other persistent atrial fibrillation: Secondary | ICD-10-CM | POA: Insufficient documentation

## 2022-03-23 DIAGNOSIS — I2510 Atherosclerotic heart disease of native coronary artery without angina pectoris: Secondary | ICD-10-CM | POA: Diagnosis not present

## 2022-03-23 DIAGNOSIS — Z7901 Long term (current) use of anticoagulants: Secondary | ICD-10-CM | POA: Diagnosis not present

## 2022-03-23 DIAGNOSIS — Z6837 Body mass index (BMI) 37.0-37.9, adult: Secondary | ICD-10-CM | POA: Diagnosis not present

## 2022-03-23 DIAGNOSIS — I10 Essential (primary) hypertension: Secondary | ICD-10-CM | POA: Insufficient documentation

## 2022-03-23 DIAGNOSIS — D6869 Other thrombophilia: Secondary | ICD-10-CM | POA: Diagnosis not present

## 2022-03-23 NOTE — Patient Instructions (Addendum)
TEE SCHEDULED FOR- April 03, 2022   - Arrive at the [facility] and go to admitting at 11:30am   - Do not eat or drink anything after midnight the night prior to your procedure.   - Take all your morning medication (except diabetic medications) with a sip of water prior to arrival.  - You will not be able to drive home after your procedure.    - Do NOT miss any doses of your blood thinner - if you should miss a dose please notify our office immediately.   - If you feel as if you go back into normal rhythm prior to scheduled cardioversion, please notify our office immediately.  If your procedure is canceled in the cardioversion suite you will be charged a cancellation fee.   If you are on weekly OZEMPIC, TRULICITY, MOUNJARO, WEGOVY, OR BYDUREON  Hold medication 7 days prior to scheduled procedure/anesthesia.  Restart medication on the normal dosing day after scheduled procedure/anesthesia  If you are on daily BYETTA, VICTOZA, ADLYXIN, OR RYBELSUS:   Hold medication 24 hours prior to scheduled procedure/anesthesia.   Restart medication on the following day after scheduled procedure/anesthesia   For those patients who have a scheduled procedure/anesthesia on the same day of the week as their dose, hold the medication on the day of surgery.  They can take their scheduled dose the week before.  **Patients on the above medications scheduled for elective procedures that have not held the medication for the appropriate amount of time are at risk of cancellation or change in the anesthetic plan.

## 2022-03-23 NOTE — Progress Notes (Signed)
Primary Care Physician: Street, Sharon Mt, MD Primary Cardiologist: Dr Claiborne Billings Primary Electrophysiologist: Dr Curt Bears  Referring Physician: Dr Renita Papa is a 77 y.o. male with a history of CAD, HLD, HTN, OSA, atrial fibrillation who presents for follow up in the [facility]. He was previously on amiodarone but developed skin toxicity. He had ablation x2 at [facility] by Dr. Ola Spurr in 2009. He has obstructive sleep apnea. June 2023 was found to be in atrial fibrillation. He had a cardioversion 10/05/2021. At that time, he had 3 unsuccessful attempts at cardioversion. Patient is on Eliquis for a CHADS2VASC score of 3. Patient was scheduled for ablation with Dr Curt Bears but the pre ablation CT and TEE showed LA thrombus and the surgery was cancelled. His Xarelto was changed to Eliquis. Patient remains in rate controlled afib today.   Today, he denies symptoms of palpitations, chest pain, shortness of breath, orthopnea, PND, lower extremity edema, dizziness, presyncope, syncope, snoring, daytime somnolence, bleeding, or neurologic sequela. The patient is tolerating medications without difficulties and is otherwise without complaint today.    Atrial Fibrillation Risk Factors:  he does have symptoms or diagnosis of sleep apnea. he is compliant with CPAP therapy. he does not have a history of rheumatic fever.   he has a BMI of Body mass index is 37.48 kg/m.Marland Kitchen Filed Weights   03/23/22 1458  Weight: 105.3 kg    Family History  Problem Relation Age of Onset   Coronary artery disease Father        MI   Hypertension Father    Diabetes Mother    Hypertension Sister    Atrial fibrillation Child    Hypertension Child      Atrial Fibrillation Management history:  Previous antiarrhythmic drugs: amiodarone  Previous cardioversions: several, most recently 10/05/21 Previous ablations: 2008, 2009 CHADS2VASC score: 3 Anticoagulation  history: Xarelto, Eliquis   Past Medical History:  Diagnosis Date   Atrial fibrillation ([place])    CAD (coronary artery disease)    LAD stent in 1999   Hyperlipidemia    Hypertension    PAF (paroxysmal atrial fibrillation) ([place])    cardioversion in 2013   SSS (sick sinus syndrome) ([place])    Past Surgical History:  Procedure Laterality Date   ATRIAL FIBRILLATION ABLATION N/A 02/20/2022   Procedure: ATRIAL FIBRILLATION ABLATION;  Surgeon: Constance Haw, MD;  Location: [place] [facility];  Service: Cardiovascular;  Laterality: N/A;   CARDIAC ELECTROPHYSIOLOGY STUDY AND ABLATION  2008, 2009   RF ablation (x4) [facility] - Dr. Ola Spurr)   CARDIOVERSION  06/15/2011   Procedure: CARDIOVERSION;  Surgeon: Pixie Casino, MD;  Location: [place];  Service: Cardiovascular;  Laterality: N/A;   CARDIOVERSION N/A 09/15/2021   Procedure: CARDIOVERSION;  Surgeon: Jerline Pain, MD;  Location: [place];  Service: Cardiovascular;  Laterality: N/A;   CORONARY ANGIOPLASTY WITH STENT PLACEMENT  10/29/1997   .0x20 self-expanding radius stent to LAD (Dr. Marella Chimes)   [place]  07/2011   bruce myoview; partially fixed apical defect, worse at rest than stress; fixed inferobasal diaphragmatic attenuation artifact; no reversible ischemia; post-stress EF 60%; short run of NSVT; low risk scan    TEE WITHOUT CARDIOVERSION N/A 02/20/2022   Procedure: TRANSESOPHAGEAL ECHOCARDIOGRAM (TEE);  Surgeon: Constance Haw, MD;  Location: [place] [facility];  Service: Cardiovascular;  Laterality: N/A;   TRANSTHORACIC ECHOCARDIOGRAM  06/13/2011   EF 55-60%, mod conc  LVH; LA mildly dilated    Current Outpatient Medications  Medication Sig Dispense Refill   acetaminophen (TYLENOL) 650 MG CR tablet Take 650-1,300 mg by mouth every 8 (eight) hours as needed for pain.     apixaban (ELIQUIS) 5 MG TABS tablet Take 1 tablet (5 mg total) by mouth 2 (two) times daily. 180 tablet 3   apixaban  (ELIQUIS) 5 MG TABS tablet Take 1 tablet (5 mg total) by mouth 2 (two) times daily. 60 tablet 0   aspirin 81 MG chewable tablet Chew 81 mg by mouth in the morning.     atorvastatin (LIPITOR) 40 MG tablet TAKE ONE TABLET BY MOUTH EVERYDAY AT BEDTIME 90 tablet 3   diltiazem (CARDIZEM CD) 180 MG 24 hr capsule Take 1 capsule (180 mg total) by mouth daily. 30 capsule 6   esomeprazole (NEXIUM) 20 MG capsule Take 20 mg by mouth daily before breakfast.     Garlic 1962 MG CAPS Take 1,000 mg by mouth in the morning.     metoprolol tartrate (LOPRESSOR) 100 MG tablet Take 1 tablet (100 mg total) by mouth 2 (two) times daily. 180 tablet 3   Omega-3 Fatty Acids (OMEGA 3 PO) Take 1,000 mg by mouth in the morning.     No current facility-administered medications for this encounter.    Allergies  Allergen Reactions   Nitroglycerin Other (See Comments)    "Blood Pressure goes to zero"    Social History   Socioeconomic History   Marital status: Married    Spouse name: Not on file   Number of children: 3   Years of education: Not on file   Highest education level: Not on file  Occupational History   Not on file  Tobacco Use   Smoking status: Former    Packs/day: 1.00    Types: Cigarettes    Quit date: 04/07/1975    Years since quitting: 46.9   Smokeless tobacco: Former   Tobacco comments:    quit smoking 1973. quit smokeless about 30 years ago.  Substance and Sexual Activity   Alcohol use: Yes   Drug use: No   Sexual activity: Not on file  Other Topics Concern   Not on file  Social History Narrative   Not on file   Social Determinants of Health   Financial Resource Strain: Not on file  Food Insecurity: Not on file  Transportation Needs: Not on file  Physical Activity: Not on file  Stress: Not on file  Social Connections: Not on file  Intimate Partner Violence: Not on file     ROS- All systems are reviewed and negative except as per the HPI above.  Physical Exam: Vitals:    03/23/22 1458  BP: 130/74  Pulse: 84  Weight: 105.3 kg  Height: '5\' 6"'$  (1.676 m)    GEN- The patient is a well appearing obese elderly male, alert and oriented x 3 today.   Head- normocephalic, atraumatic Eyes-  Sclera clear, conjunctiva pink Ears- hearing intact Oropharynx- clear Neck- supple  Lungs- Clear to ausculation bilaterally, normal work of breathing Heart- irregular rate and rhythm, no murmurs, rubs or gallops  GI- soft, NT, ND, + BS Extremities- no clubbing, cyanosis, or edema MS- no significant deformity or atrophy Skin- no rash or lesion Psych- euthymic mood, full affect Neuro- strength and sensation are intact  Wt Readings from Last 3 Encounters:  03/23/22 105.3 kg  02/20/22 102.1 kg  10/29/21 101.4 kg    EKG today demonstrates  Afib Vent. rate 84 BPM PR interval * ms QRS duration 104 ms QT/QTcB 394/465 ms  Echo 10/10/21 demonstrated   1. Left ventricular ejection fraction, by estimation, is 45-50% with beat  to beat variability. The left ventricle has mildly decreased function.  Left ventricular endocardial border not optimally defined to evaluate  regional wall motion. There is mild left ventricular hypertrophy. Left ventricular diastolic parameters are indeterminate.   2. Right ventricular systolic function is mildly reduced. The right  ventricular size is normal. There is normal pulmonary artery systolic  pressure. The estimated right ventricular systolic pressure is 94.0 mmHg.   3. Left atrial size was mildly dilated.   4. The mitral valve is normal in structure. Mild mitral valve  regurgitation. No evidence of mitral stenosis.   5. The aortic valve is grossly normal. There is mild thickening of the  aortic valve. Aortic valve regurgitation is trivial. No aortic stenosis is  present.   6. The inferior vena cava is normal in size with greater than 50%  respiratory variability, suggesting right atrial pressure of 3 mmHg.   Comparison(s): A prior  study was performed on 07/17/20. Prior images  reviewed side by side. LV function has decreased compared to prior exam.    Epic records are reviewed at length today  CHA2DS2-VASc Score = 3  The patient's score is based upon: CHF History: 0 HTN History: 1 Diabetes History: 0 Stroke History: 0 Vascular Disease History: 1 Age Score: 1 Gender Score: 0       ASSESSMENT AND PLAN: 1. Persistent Atrial Fibrillation (ICD10:  I48.19) The patient's CHA2DS2-VASc score is 3, indicating a 3.2% annual risk of stroke.   Patient remains in rate controlled afib. We discussed rhythm control options. Will recheck TEE now that he has been on Eliquis for a month. If thrombus has resolved, patient would like to still move forward with ablation.  Continue Eliquis 5 mg BID Continue Lopressor 100 mg BID Continue diltiazem 180 mg daily  2. Secondary Hypercoagulable State (ICD10:  D68.69) The patient is at significant risk for stroke/thromboembolism based upon his CHA2DS2-VASc Score of 3.  Continue Apixaban (Eliquis).   3. Obesity Body mass index is 37.48 kg/m. Lifestyle modification was discussed at length including regular exercise and weight reduction.  4. Obstructive sleep apnea The importance of adequate treatment of sleep apnea was discussed today in order to improve our ability to maintain sinus rhythm long term. Encouraged compliance with CPAP therapy.   5. CAD No anginal symptoms.  6. HTN Stable, no changes today.   Follow up with Dr Curt Bears post TEE.    [facility] [address] [place], [place] [address] [phone] 03/23/2022 3:24 PM

## 2022-03-23 NOTE — H&P (View-Only) (Signed)
Primary Care Physician: Street, Sharon Mt, MD Primary Cardiologist: Dr Claiborne Billings Primary Electrophysiologist: Dr Curt Bears  Referring Physician: Dr Renita Papa is a 77 y.o. male with a history of CAD, HLD, HTN, OSA, atrial fibrillation who presents for follow up in the [facility]. He was previously on amiodarone but developed skin toxicity. He had ablation x2 at [facility] by Dr. Ola Spurr in 2009. He has obstructive sleep apnea. June 2023 was found to be in atrial fibrillation. He had a cardioversion 10/05/2021. At that time, he had 3 unsuccessful attempts at cardioversion. Patient is on Eliquis for a CHADS2VASC score of 3. Patient was scheduled for ablation with Dr Curt Bears but the pre ablation CT and TEE showed LA thrombus and the surgery was cancelled. His Xarelto was changed to Eliquis. Patient remains in rate controlled afib today.   Today, he denies symptoms of palpitations, chest pain, shortness of breath, orthopnea, PND, lower extremity edema, dizziness, presyncope, syncope, snoring, daytime somnolence, bleeding, or neurologic sequela. The patient is tolerating medications without difficulties and is otherwise without complaint today.    Atrial Fibrillation Risk Factors:  he does have symptoms or diagnosis of sleep apnea. he is compliant with CPAP therapy. he does not have a history of rheumatic fever.   he has a BMI of Body mass index is 37.48 kg/m.Marland Kitchen Filed Weights   03/23/22 1458  Weight: 105.3 kg    Family History  Problem Relation Age of Onset   Coronary artery disease Father        MI   Hypertension Father    Diabetes Mother    Hypertension Sister    Atrial fibrillation Child    Hypertension Child      Atrial Fibrillation Management history:  Previous antiarrhythmic drugs: amiodarone  Previous cardioversions: several, most recently 10/05/21 Previous ablations: 2008, 2009 CHADS2VASC score: 3 Anticoagulation  history: Xarelto, Eliquis   Past Medical History:  Diagnosis Date   Atrial fibrillation ([place])    CAD (coronary artery disease)    LAD stent in 1999   Hyperlipidemia    Hypertension    PAF (paroxysmal atrial fibrillation) ([place])    cardioversion in 2013   SSS (sick sinus syndrome) ([place])    Past Surgical History:  Procedure Laterality Date   ATRIAL FIBRILLATION ABLATION N/A 02/20/2022   Procedure: ATRIAL FIBRILLATION ABLATION;  Surgeon: Constance Haw, MD;  Location: [place] [facility];  Service: Cardiovascular;  Laterality: N/A;   CARDIAC ELECTROPHYSIOLOGY STUDY AND ABLATION  2008, 2009   RF ablation (x4) [facility] - Dr. Ola Spurr)   CARDIOVERSION  06/15/2011   Procedure: CARDIOVERSION;  Surgeon: Pixie Casino, MD;  Location: [place];  Service: Cardiovascular;  Laterality: N/A;   CARDIOVERSION N/A 09/15/2021   Procedure: CARDIOVERSION;  Surgeon: Jerline Pain, MD;  Location: [place];  Service: Cardiovascular;  Laterality: N/A;   CORONARY ANGIOPLASTY WITH STENT PLACEMENT  10/29/1997   .0x20 self-expanding radius stent to LAD (Dr. Marella Chimes)   [place]  07/2011   bruce myoview; partially fixed apical defect, worse at rest than stress; fixed inferobasal diaphragmatic attenuation artifact; no reversible ischemia; post-stress EF 60%; short run of NSVT; low risk scan    TEE WITHOUT CARDIOVERSION N/A 02/20/2022   Procedure: TRANSESOPHAGEAL ECHOCARDIOGRAM (TEE);  Surgeon: Constance Haw, MD;  Location: [place] [facility];  Service: Cardiovascular;  Laterality: N/A;   TRANSTHORACIC ECHOCARDIOGRAM  06/13/2011   EF 55-60%, mod conc  LVH; LA mildly dilated    Current Outpatient Medications  Medication Sig Dispense Refill   acetaminophen (TYLENOL) 650 MG CR tablet Take 650-1,300 mg by mouth every 8 (eight) hours as needed for pain.     apixaban (ELIQUIS) 5 MG TABS tablet Take 1 tablet (5 mg total) by mouth 2 (two) times daily. 180 tablet 3   apixaban  (ELIQUIS) 5 MG TABS tablet Take 1 tablet (5 mg total) by mouth 2 (two) times daily. 60 tablet 0   aspirin 81 MG chewable tablet Chew 81 mg by mouth in the morning.     atorvastatin (LIPITOR) 40 MG tablet TAKE ONE TABLET BY MOUTH EVERYDAY AT BEDTIME 90 tablet 3   diltiazem (CARDIZEM CD) 180 MG 24 hr capsule Take 1 capsule (180 mg total) by mouth daily. 30 capsule 6   esomeprazole (NEXIUM) 20 MG capsule Take 20 mg by mouth daily before breakfast.     Garlic 4097 MG CAPS Take 1,000 mg by mouth in the morning.     metoprolol tartrate (LOPRESSOR) 100 MG tablet Take 1 tablet (100 mg total) by mouth 2 (two) times daily. 180 tablet 3   Omega-3 Fatty Acids (OMEGA 3 PO) Take 1,000 mg by mouth in the morning.     No current facility-administered medications for this encounter.    Allergies  Allergen Reactions   Nitroglycerin Other (See Comments)    "Blood Pressure goes to zero"    Social History   Socioeconomic History   Marital status: Married    Spouse name: Not on file   Number of children: 3   Years of education: Not on file   Highest education level: Not on file  Occupational History   Not on file  Tobacco Use   Smoking status: Former    Packs/day: 1.00    Types: Cigarettes    Quit date: 04/07/1975    Years since quitting: 46.9   Smokeless tobacco: Former   Tobacco comments:    quit smoking 1973. quit smokeless about 30 years ago.  Substance and Sexual Activity   Alcohol use: Yes   Drug use: No   Sexual activity: Not on file  Other Topics Concern   Not on file  Social History Narrative   Not on file   Social Determinants of Health   Financial Resource Strain: Not on file  Food Insecurity: Not on file  Transportation Needs: Not on file  Physical Activity: Not on file  Stress: Not on file  Social Connections: Not on file  Intimate Partner Violence: Not on file     ROS- All systems are reviewed and negative except as per the HPI above.  Physical Exam: Vitals:    03/23/22 1458  BP: 130/74  Pulse: 84  Weight: 105.3 kg  Height: '5\' 6"'$  (1.676 m)    GEN- The patient is a well appearing obese elderly male, alert and oriented x 3 today.   Head- normocephalic, atraumatic Eyes-  Sclera clear, conjunctiva pink Ears- hearing intact Oropharynx- clear Neck- supple  Lungs- Clear to ausculation bilaterally, normal work of breathing Heart- irregular rate and rhythm, no murmurs, rubs or gallops  GI- soft, NT, ND, + BS Extremities- no clubbing, cyanosis, or edema MS- no significant deformity or atrophy Skin- no rash or lesion Psych- euthymic mood, full affect Neuro- strength and sensation are intact  Wt Readings from Last 3 Encounters:  03/23/22 105.3 kg  02/20/22 102.1 kg  10/29/21 101.4 kg    EKG today demonstrates  Afib Vent. rate 84 BPM PR interval * ms QRS duration 104 ms QT/QTcB 394/465 ms  Echo 10/10/21 demonstrated   1. Left ventricular ejection fraction, by estimation, is 45-50% with beat  to beat variability. The left ventricle has mildly decreased function.  Left ventricular endocardial border not optimally defined to evaluate  regional wall motion. There is mild left ventricular hypertrophy. Left ventricular diastolic parameters are indeterminate.   2. Right ventricular systolic function is mildly reduced. The right  ventricular size is normal. There is normal pulmonary artery systolic  pressure. The estimated right ventricular systolic pressure is 30.0 mmHg.   3. Left atrial size was mildly dilated.   4. The mitral valve is normal in structure. Mild mitral valve  regurgitation. No evidence of mitral stenosis.   5. The aortic valve is grossly normal. There is mild thickening of the  aortic valve. Aortic valve regurgitation is trivial. No aortic stenosis is  present.   6. The inferior vena cava is normal in size with greater than 50%  respiratory variability, suggesting right atrial pressure of 3 mmHg.   Comparison(s): A prior  study was performed on 07/17/20. Prior images  reviewed side by side. LV function has decreased compared to prior exam.    Epic records are reviewed at length today  CHA2DS2-VASc Score = 3  The patient's score is based upon: CHF History: 0 HTN History: 1 Diabetes History: 0 Stroke History: 0 Vascular Disease History: 1 Age Score: 1 Gender Score: 0       ASSESSMENT AND PLAN: 1. Persistent Atrial Fibrillation (ICD10:  I48.19) The patient's CHA2DS2-VASc score is 3, indicating a 3.2% annual risk of stroke.   Patient remains in rate controlled afib. We discussed rhythm control options. Will recheck TEE now that he has been on Eliquis for a month. If thrombus has resolved, patient would like to still move forward with ablation.  Continue Eliquis 5 mg BID Continue Lopressor 100 mg BID Continue diltiazem 180 mg daily  2. Secondary Hypercoagulable State (ICD10:  D68.69) The patient is at significant risk for stroke/thromboembolism based upon his CHA2DS2-VASc Score of 3.  Continue Apixaban (Eliquis).   3. Obesity Body mass index is 37.48 kg/m. Lifestyle modification was discussed at length including regular exercise and weight reduction.  4. Obstructive sleep apnea The importance of adequate treatment of sleep apnea was discussed today in order to improve our ability to maintain sinus rhythm long term. Encouraged compliance with CPAP therapy.   5. CAD No anginal symptoms.  6. HTN Stable, no changes today.   Follow up with Dr Curt Bears post TEE.    [facility] [address] [place], [place] [address] [phone] 03/23/2022 3:24 PM

## 2022-03-24 ENCOUNTER — Other Ambulatory Visit (HOSPITAL_COMMUNITY): Payer: Self-pay | Admitting: *Deleted

## 2022-03-24 DIAGNOSIS — I513 Intracardiac thrombosis, not elsewhere classified: Secondary | ICD-10-CM

## 2022-04-02 ENCOUNTER — Encounter (HOSPITAL_COMMUNITY): Payer: Self-pay | Admitting: Internal Medicine

## 2022-04-03 ENCOUNTER — Encounter (HOSPITAL_COMMUNITY): Payer: Self-pay | Admitting: Internal Medicine

## 2022-04-03 ENCOUNTER — Ambulatory Visit (HOSPITAL_BASED_OUTPATIENT_CLINIC_OR_DEPARTMENT_OTHER)
Admission: RE | Admit: 2022-04-03 | Discharge: 2022-04-03 | Disposition: A | Discharge status: Home / Self Care | Payer: Medicare Other | Source: Ambulatory Visit | Attending: Physician Assistant | Admitting: Physician Assistant

## 2022-04-03 ENCOUNTER — Ambulatory Visit (HOSPITAL_BASED_OUTPATIENT_CLINIC_OR_DEPARTMENT_OTHER): Payer: Medicare Other | Admitting: Anesthesiology

## 2022-04-03 ENCOUNTER — Encounter (HOSPITAL_COMMUNITY)
Admission: RE | Disposition: A | Discharge status: Home / Self Care | Payer: Self-pay | Source: Home / Self Care | Attending: Internal Medicine

## 2022-04-03 ENCOUNTER — Other Ambulatory Visit: Payer: Self-pay

## 2022-04-03 ENCOUNTER — Ambulatory Visit (HOSPITAL_COMMUNITY)
Admission: RE | Admit: 2022-04-03 | Discharge: 2022-04-03 | Disposition: A | Discharge status: Home / Self Care | Payer: Medicare Other | Attending: Internal Medicine | Admitting: Internal Medicine

## 2022-04-03 ENCOUNTER — Ambulatory Visit (HOSPITAL_COMMUNITY): Payer: Medicare Other | Admitting: Anesthesiology

## 2022-04-03 DIAGNOSIS — Z6837 Body mass index (BMI) 37.0-37.9, adult: Secondary | ICD-10-CM | POA: Insufficient documentation

## 2022-04-03 DIAGNOSIS — I219 Acute myocardial infarction, unspecified: Secondary | ICD-10-CM | POA: Diagnosis not present

## 2022-04-03 DIAGNOSIS — I080 Rheumatic disorders of both mitral and aortic valves: Secondary | ICD-10-CM | POA: Insufficient documentation

## 2022-04-03 DIAGNOSIS — E785 Hyperlipidemia, unspecified: Secondary | ICD-10-CM | POA: Insufficient documentation

## 2022-04-03 DIAGNOSIS — Z79899 Other long term (current) drug therapy: Secondary | ICD-10-CM | POA: Insufficient documentation

## 2022-04-03 DIAGNOSIS — I340 Nonrheumatic mitral (valve) insufficiency: Secondary | ICD-10-CM | POA: Diagnosis not present

## 2022-04-03 DIAGNOSIS — I513 Intracardiac thrombosis, not elsewhere classified: Secondary | ICD-10-CM

## 2022-04-03 DIAGNOSIS — I2510 Atherosclerotic heart disease of native coronary artery without angina pectoris: Secondary | ICD-10-CM | POA: Insufficient documentation

## 2022-04-03 DIAGNOSIS — Z87891 Personal history of nicotine dependence: Secondary | ICD-10-CM | POA: Diagnosis not present

## 2022-04-03 DIAGNOSIS — I4891 Unspecified atrial fibrillation: Secondary | ICD-10-CM | POA: Diagnosis not present

## 2022-04-03 DIAGNOSIS — D6869 Other thrombophilia: Secondary | ICD-10-CM | POA: Insufficient documentation

## 2022-04-03 DIAGNOSIS — G4733 Obstructive sleep apnea (adult) (pediatric): Secondary | ICD-10-CM

## 2022-04-03 DIAGNOSIS — I700 Atherosclerosis of aorta: Secondary | ICD-10-CM | POA: Diagnosis not present

## 2022-04-03 DIAGNOSIS — E669 Obesity, unspecified: Secondary | ICD-10-CM | POA: Insufficient documentation

## 2022-04-03 DIAGNOSIS — Z7901 Long term (current) use of anticoagulants: Secondary | ICD-10-CM | POA: Insufficient documentation

## 2022-04-03 DIAGNOSIS — I10 Essential (primary) hypertension: Secondary | ICD-10-CM | POA: Insufficient documentation

## 2022-04-03 DIAGNOSIS — I4819 Other persistent atrial fibrillation: Secondary | ICD-10-CM | POA: Diagnosis not present

## 2022-04-03 DIAGNOSIS — Z9989 Dependence on other enabling machines and devices: Secondary | ICD-10-CM

## 2022-04-03 HISTORY — PX: TEE WITHOUT CARDIOVERSION: SHX5443

## 2022-04-03 LAB — POCT I-STAT, CHEM 8
BUN: 11 mg/dL (ref 8–23)
Calcium, Ion: 1.17 mmol/L (ref 1.15–1.40)
Chloride: 107 mmol/L (ref 98–111)
Creatinine, Ser: 0.8 mg/dL (ref 0.61–1.24)
Glucose, Bld: 108 mg/dL — ABNORMAL HIGH (ref 70–99)
HCT: 40 % (ref 39.0–52.0)
Hemoglobin: 13.6 g/dL (ref 13.0–17.0)
Potassium: 3.9 mmol/L (ref 3.5–5.1)
Sodium: 141 mmol/L (ref 135–145)
TCO2: 23 mmol/L (ref 22–32)

## 2022-04-03 LAB — ECHO TEE

## 2022-04-03 SURGERY — ECHOCARDIOGRAM, TRANSESOPHAGEAL
Anesthesia: Monitor Anesthesia Care

## 2022-04-03 MED ORDER — SODIUM CHLORIDE 0.9 % IV SOLN: INTRAVENOUS | Status: DC

## 2022-04-03 MED ORDER — LACTATED RINGERS IV SOLN
INTRAVENOUS | Status: DC
  Administered 2022-04-03: 1000 mL via INTRAVENOUS

## 2022-04-03 MED ORDER — LIDOCAINE 2% (20 MG/ML) 5 ML SYRINGE
INTRAMUSCULAR | Status: DC | PRN
  Administered 2022-04-03: 60 mg via INTRAVENOUS

## 2022-04-03 MED ORDER — PROPOFOL 500 MG/50ML IV EMUL
INTRAVENOUS | Status: DC | PRN
  Administered 2022-04-03: 150 ug/kg/min via INTRAVENOUS

## 2022-04-03 MED ORDER — BUTAMBEN-TETRACAINE-BENZOCAINE 2-2-14 % EX AERO
INHALATION_SPRAY | CUTANEOUS | Status: DC | PRN
  Administered 2022-04-03: 1 via TOPICAL

## 2022-04-03 NOTE — Transfer of Care (Signed)
Immediate Anesthesia Transfer of Care Note  Patient: Timothy [name]  Procedure(s) Performed: TRANSESOPHAGEAL ECHOCARDIOGRAM (TEE)  Patient Location: Endoscopy Unit  Anesthesia Type:MAC  Level of Consciousness: drowsy  Airway & Oxygen Therapy: Patient Spontanous Breathing  Post-op Assessment: Report given to RN and Post -op Vital signs reviewed and stable  Post vital signs: Reviewed and stable  Last Vitals:  Vitals Value Taken Time  BP 104/78 04/03/22 1403  Temp    Pulse 88 04/03/22 1404  Resp 19 04/03/22 1404  SpO2 94 % 04/03/22 1404  Vitals shown include unvalidated device data.  Last Pain:  Vitals:   04/03/22 1202  TempSrc: Temporal  PainSc: 0-No pain         Complications: No notable events documented.

## 2022-04-03 NOTE — Anesthesia Procedure Notes (Addendum)
Procedure Name: MAC Date/Time: 04/03/2022 1:24 PM  Performed by: Leonor Liv, CRNAPre-anesthesia Checklist: Patient identified, Emergency Drugs available, Suction available, Patient being monitored and Timeout performed Patient Re-evaluated:Patient Re-evaluated prior to induction Oxygen Delivery Method: Nasal cannula Placement Confirmation: positive ETCO2 Dental Injury: Teeth and Oropharynx as per pre-operative assessment  Comments: optiflow

## 2022-04-03 NOTE — CV Procedure (Signed)
INDICATIONS: LAA thrombus  PROCEDURE:   Informed consent was obtained prior to the procedure. The risks, benefits and alternatives for the procedure were discussed and the patient comprehended these risks.  Risks include, but are not limited to, cough, sore throat, vomiting, nausea, somnolence, esophageal and stomach trauma or perforation, bleeding, low blood pressure, aspiration, pneumonia, infection, trauma to the teeth and death.    After a procedural time-out, the oropharynx was anesthetized with 20% benzocaine spray.   During this procedure the patient was administered propofol per anesthesia.  The patient's heart rate, blood pressure, and oxygen saturation were monitored continuously during the procedure. The period of conscious sedation was 20 minutes, of which I was present face-to-face 100% of this time.  The transesophageal probe was inserted in the esophagus and stomach without difficulty and multiple views were obtained.  The patient was kept under observation until the patient left the procedure room.  The patient left the procedure room in stable condition.   Agitated microbubble saline contrast was not administered.  COMPLICATIONS:    There were no immediate complications.  FINDINGS:   FORMAL ECHOCARDIOGRAM REPORT PENDING Limited echo performed to reassess LAA thrombus.  There is left atrial appendage smoke and small opacity at tip of LAA that is likely thrombus.   RECOMMENDATIONS:    D/c when alert.  Time Spent Directly with the Patient:  45 minutes   Elouise Munroe 04/03/2022, 1:59 PM

## 2022-04-03 NOTE — Anesthesia Preprocedure Evaluation (Addendum)
Anesthesia Evaluation  Patient identified by MRN, date of birth, ID band Patient awake    Reviewed: Allergy & Precautions, NPO status , Patient's Chart, lab work & pertinent test results  Airway Mallampati: III  TM Distance: >3 FB Neck ROM: Full    Dental no notable dental hx.    Pulmonary sleep apnea and Continuous Positive Airway Pressure Ventilation , former smoker   Pulmonary exam normal        Cardiovascular hypertension, Pt. on home beta blockers + CAD and + Cardiac Stents  + dysrhythmias Atrial Fibrillation  Rhythm:Irregular Rate:Normal     Neuro/Psych negative neurological ROS  negative psych ROS   GI/Hepatic Neg liver ROS,GERD  Medicated and Controlled,,  Endo/Other  negative endocrine ROS    Renal/GU negative Renal ROS     Musculoskeletal negative musculoskeletal ROS (+)    Abdominal   Peds  Hematology  (+) Blood dyscrasia (Eliquis)   Anesthesia Other Findings LEFT ATRIAL THROMBUS  Reproductive/Obstetrics                             Anesthesia Physical Anesthesia Plan  ASA: 3  Anesthesia Plan: MAC   Post-op Pain Management:    Induction: Intravenous  PONV Risk Score and Plan: 1 and Propofol infusion and Treatment may vary due to age or medical condition  Airway Management Planned: Nasal Cannula  Additional Equipment:   Intra-op Plan:   Post-operative Plan:   Informed Consent: I have reviewed the patients History and Physical, chart, labs and discussed the procedure including the risks, benefits and alternatives for the proposed anesthesia with the patient or authorized representative who has indicated his/her understanding and acceptance.     Dental advisory given  Plan Discussed with: CRNA  Anesthesia Plan Comments:        Anesthesia Quick Evaluation

## 2022-04-03 NOTE — Discharge Instructions (Signed)

## 2022-04-03 NOTE — Interval H&P Note (Signed)
History and Physical Interval Note:  04/03/2022 12:20 PM  Timothy [name]  has presented today for surgery, with the diagnosis of LEFT ATRIAL THROMBUS.  The various methods of treatment have been discussed with the patient and family. After consideration of risks, benefits and other options for treatment, the patient has consented to  Procedure(s): TRANSESOPHAGEAL ECHOCARDIOGRAM (TEE) (N/A) as a surgical intervention.  The patient's history has been reviewed, patient examined, no change in status, stable for surgery.  I have reviewed the patient's chart and labs.  Questions were answered to the patient's satisfaction.     Elouise Munroe

## 2022-04-04 LAB — ECHO TEE
Est EF: 55
Height: 66 in
Weight: 3600 oz

## 2022-04-04 NOTE — Anesthesia Postprocedure Evaluation (Signed)
Anesthesia Post Note  Patient: Timothy [name]  Procedure(s) Performed: TRANSESOPHAGEAL ECHOCARDIOGRAM (TEE)     Patient location during evaluation: Endoscopy Anesthesia Type: MAC Level of consciousness: awake Pain management: pain level controlled Vital Signs Assessment: post-procedure vital signs reviewed and stable Respiratory status: spontaneous breathing, nonlabored ventilation and respiratory function stable Cardiovascular status: blood pressure returned to baseline and stable Postop Assessment: no apparent nausea or vomiting Anesthetic complications: no   No notable events documented.  Last Vitals:  Vitals:   04/03/22 1440 04/03/22 1500  BP: (!) 139/90 139/86  Pulse: 85 75  Resp: 14 14  Temp:    SpO2: 97% 97%    Last Pain:  Vitals:   04/03/22 1500  TempSrc:   PainSc: 0-No pain                 Chisum Habenicht P Marlene Beidler

## 2022-04-05 ENCOUNTER — Encounter (HOSPITAL_COMMUNITY): Payer: Self-pay | Admitting: Internal Medicine

## 2022-04-06 DIAGNOSIS — H2513 Age-related nuclear cataract, bilateral: Secondary | ICD-10-CM | POA: Diagnosis not present

## 2022-04-10 ENCOUNTER — Encounter (HOSPITAL_COMMUNITY): Payer: Self-pay | Admitting: Cardiology

## 2022-04-10 DIAGNOSIS — N401 Enlarged prostate with lower urinary tract symptoms: Secondary | ICD-10-CM | POA: Diagnosis not present

## 2022-04-10 DIAGNOSIS — Z0000 Encounter for general adult medical examination without abnormal findings: Secondary | ICD-10-CM | POA: Diagnosis not present

## 2022-04-10 DIAGNOSIS — I25119 Atherosclerotic heart disease of native coronary artery with unspecified angina pectoris: Secondary | ICD-10-CM | POA: Diagnosis not present

## 2022-04-10 DIAGNOSIS — M8589 Other specified disorders of bone density and structure, multiple sites: Secondary | ICD-10-CM | POA: Diagnosis not present

## 2022-04-10 DIAGNOSIS — D6869 Other thrombophilia: Secondary | ICD-10-CM | POA: Diagnosis not present

## 2022-04-10 DIAGNOSIS — K2960 Other gastritis without bleeding: Secondary | ICD-10-CM | POA: Diagnosis not present

## 2022-04-10 DIAGNOSIS — N138 Other obstructive and reflux uropathy: Secondary | ICD-10-CM | POA: Diagnosis not present

## 2022-04-10 DIAGNOSIS — I513 Intracardiac thrombosis, not elsewhere classified: Secondary | ICD-10-CM | POA: Diagnosis not present

## 2022-04-10 DIAGNOSIS — I480 Paroxysmal atrial fibrillation: Secondary | ICD-10-CM | POA: Diagnosis not present

## 2022-04-10 DIAGNOSIS — Z79899 Other long term (current) drug therapy: Secondary | ICD-10-CM | POA: Diagnosis not present

## 2022-04-10 DIAGNOSIS — E785 Hyperlipidemia, unspecified: Secondary | ICD-10-CM | POA: Diagnosis not present

## 2022-04-20 ENCOUNTER — Ambulatory Visit: Payer: Medicare Other | Attending: Cardiology | Admitting: Cardiology

## 2022-04-20 ENCOUNTER — Encounter: Payer: Self-pay | Admitting: Cardiology

## 2022-04-20 VITALS — BP 110/84 | HR 94 | Ht 66.0 in | Wt 233.6 lb

## 2022-04-20 DIAGNOSIS — D6869 Other thrombophilia: Secondary | ICD-10-CM | POA: Diagnosis not present

## 2022-04-20 DIAGNOSIS — I2510 Atherosclerotic heart disease of native coronary artery without angina pectoris: Secondary | ICD-10-CM | POA: Insufficient documentation

## 2022-04-20 DIAGNOSIS — I4819 Other persistent atrial fibrillation: Secondary | ICD-10-CM | POA: Insufficient documentation

## 2022-04-20 NOTE — Patient Instructions (Signed)
Medication Instructions:  Your physician recommends that you continue on your current medications as directed. Please refer to the Current Medication list given to you today.  *If you need a refill on your cardiac medications before your next appointment, please call your pharmacy*   Lab Work: Pre procedure labs -- see procedure instruction letter:  BMP & CBC  If you have labs (blood work) drawn today and your tests are completely normal, you will receive your results only by: [place] (if you have MyChart) OR A paper copy in the mail If you have any lab test that is abnormal or we need to change your treatment, we will call you to review the results.   Testing/Procedures: Your physician has requested that you have a TEE. During a TEE, sound waves are used to create images of your heart. It provides your doctor with information about the size and shape of your heart and how well your heart's chambers and valves are working. In this test, a transducer is attached to the end of a flexible tube that's guided down your throat and into your esophagus (the tube leading from you mouth to your stomach) to get a more detailed image of your heart. You are not awake for the procedure.   The EP scheduler will be in touch to arrange this procedure 2 days PRIOR to your repeat ablation    Your physician has recommended that you have an ablation. Catheter ablation is a medical procedure used to treat some cardiac arrhythmias (irregular heartbeats). During catheter ablation, a long, thin, flexible tube is put into a blood vessel in your groin (upper thigh), or neck. This tube is called an ablation catheter. It is then guided to your heart through the blood vessel. Radio frequency waves destroy small areas of heart tissue where abnormal heartbeats may cause an arrhythmia to start. Please follow instruction letter given to you today.   Follow-Up: At [facility], you and your health needs are our  priority.  As part of our continuing mission to provide you with exceptional heart care, we have created designated Provider Care Teams.  These Care Teams include your primary Cardiologist (physician) and Advanced Practice Providers (APPs -  Physician Assistants and Nurse Practitioners) who all work together to provide you with the care you need, when you need it.  Your next appointment:   1 month(s) after your ablation  The format for your next appointment:   In Person  Provider:   AFib clinic   Thank you for choosing [facility]!!   Trinidad Curet, RN [phone]    Other Instructions   Cardiac Ablation Cardiac ablation is a procedure to destroy (ablate) some heart tissue that is sending bad signals. These bad signals cause problems in heart rhythm. The heart has many areas that make these signals. If there are problems in these areas, they can make the heart beat in a way that is not normal. Destroying some tissues can help make the heart rhythm normal. Tell your doctor about: Any allergies you have. All medicines you are taking. These include vitamins, herbs, eye drops, creams, and over-the-counter medicines. Any problems you or family members have had with medicines that make you fall asleep (anesthetics). Any blood disorders you have. Any surgeries you have had. Any medical conditions you have, such as kidney failure. Whether you are pregnant or may be pregnant. What are the risks? This is a safe procedure. But problems may occur, including: Infection. Bruising and bleeding. Bleeding  into the chest. Stroke or blood clots. Damage to nearby areas of your body. Allergies to medicines or dyes. The need for a pacemaker if the normal system is damaged. Failure of the procedure to treat the problem. What happens before the procedure? Medicines Ask your doctor about: Changing or stopping your normal medicines. This is important. Taking aspirin and ibuprofen. Do not  take these medicines unless your doctor tells you to take them. Taking other medicines, vitamins, herbs, and supplements. General instructions Follow instructions from your doctor about what you cannot eat or drink. Plan to have someone take you home from the hospital or clinic. If you will be going home right after the procedure, plan to have someone with you for 24 hours. Ask your doctor what steps will be taken to prevent infection. What happens during the procedure?  An IV tube will be put into one of your veins. You will be given a medicine to help you relax. The skin on your neck or groin will be numbed. A cut (incision) will be made in your neck or groin. A needle will be put through your cut and into a large vein. A tube (catheter) will be put into the needle. The tube will be moved to your heart. Dye may be put through the tube. This helps your doctor see your heart. Small devices (electrodes) on the tube will send out signals. A type of energy will be used to destroy some heart tissue. The tube will be taken out. Pressure will be held on your cut. This helps stop bleeding. A bandage will be put over your cut. The exact procedure may vary among doctors and hospitals. What happens after the procedure? You will be watched until you leave the hospital or clinic. This includes checking your heart rate, breathing rate, oxygen, and blood pressure. Your cut will be watched for bleeding. You will need to lie still for a few hours. Do not drive for 24 hours or as long as your doctor tells you. Summary Cardiac ablation is a procedure to destroy some heart tissue. This is done to treat heart rhythm problems. Tell your doctor about any medical conditions you may have. Tell him or her about all medicines you are taking to treat them. This is a safe procedure. But problems may occur. These include infection, bruising, bleeding, and damage to nearby areas of your body. Follow what your doctor  tells you about food and drink. You may also be told to change or stop some of your medicines. After the procedure, do not drive for 24 hours or as long as your doctor tells you. This information is not intended to replace advice given to you by your health care provider. Make sure you discuss any questions you have with your health care provider. Document Revised: 05/23/2021 Document Reviewed: 02/02/2019 Elsevier Patient Education  [place].

## 2022-04-20 NOTE — Progress Notes (Addendum)
Electrophysiology Office Note   Date:  04/20/2022   ID:  Timothy [name], Timothy [name] [birth year], MRN [id number]  PCP:  Street, Sharon Mt, MD  Cardiologist:  Claiborne Billings Primary Electrophysiologist:  Cleston Lautner Meredith Leeds, MD    Chief Complaint: AF   History of Present Illness: Timothy [name] is a 77 y.o. male who is being seen today for the evaluation of AF at the request of Street, Sharon Mt, *. Presenting today for electrophysiology evaluation.  Is a history of significant coronary artery disease, hypertension, hyperlipidemia, sick sinus syndrome, atrial fibrillation.  He had an LAD stent in 2019.  He was previously on amiodarone but developed skin toxicity.  He had ablation x 2 at [facility] in 2009.  June 2023 was found to be in atrial fibrillation.  He had cardioversion 10/05/2021.  At that time he had 3 unsuccessful attempts at cardioversion.  He was brought in for ablation attempt 02/20/2022 but was found to have a left atrial appendage thrombus.  He was switched to Eliquis.  Thrombus was confirmed on TEE 04/03/2022, though the thrombus was smaller.  Today, denies symptoms of palpitations, chest pain, shortness of breath, orthopnea, PND, lower extremity edema, claudication, dizziness, presyncope, syncope, bleeding, or neurologic sequela. The patient is tolerating medications without difficulties.     Past Medical History:  Diagnosis Date   Atrial fibrillation ([place])    CAD (coronary artery disease)    LAD stent in 1999   Hyperlipidemia    Hypertension    PAF (paroxysmal atrial fibrillation) ([place])    cardioversion in 2013   SSS (sick sinus syndrome) ([place])    Past Surgical History:  Procedure Laterality Date   ATRIAL FIBRILLATION ABLATION N/A 02/20/2022   Procedure: ATRIAL FIBRILLATION ABLATION;  Surgeon: Constance Haw, MD;  Location: [place] [facility];  Service: Cardiovascular;  Laterality: N/A;   CARDIAC ELECTROPHYSIOLOGY STUDY AND ABLATION  2008, 2009   RF ablation  (x4) [facility] - Dr. Ola Spurr)   CARDIOVERSION  06/15/2011   Procedure: CARDIOVERSION;  Surgeon: Pixie Casino, MD;  Location: [place];  Service: Cardiovascular;  Laterality: N/A;   CARDIOVERSION N/A 09/15/2021   Procedure: CARDIOVERSION;  Surgeon: Jerline Pain, MD;  Location: [place];  Service: Cardiovascular;  Laterality: N/A;   CORONARY ANGIOPLASTY WITH STENT PLACEMENT  10/29/1997   .0x20 self-expanding radius stent to LAD (Dr. Marella Chimes)   [place]  07/2011   bruce myoview; partially fixed apical defect, worse at rest than stress; fixed inferobasal diaphragmatic attenuation artifact; no reversible ischemia; post-stress EF 60%; short run of NSVT; low risk scan    TEE WITHOUT CARDIOVERSION N/A 04/03/2022   Procedure: TRANSESOPHAGEAL ECHOCARDIOGRAM (TEE);  Surgeon: Elouise Munroe, MD;  Location: [place];  Service: Cardiology;  Laterality: N/A;   TEE WITHOUT CARDIOVERSION N/A 02/20/2022   Procedure: TRANSESOPHAGEAL ECHOCARDIOGRAM (TEE);  Surgeon: Constance Haw, MD;  Location: [place] [facility];  Service: Cardiovascular;  Laterality: N/A;   TRANSTHORACIC ECHOCARDIOGRAM  06/13/2011   EF 55-60%, mod conc LVH; LA mildly dilated     Current Outpatient Medications  Medication Sig Dispense Refill   acetaminophen (TYLENOL) 650 MG CR tablet Take 1,300 mg by mouth every 8 (eight) hours as needed for pain.     apixaban (ELIQUIS) 5 MG TABS tablet Take 1 tablet (5 mg total) by mouth 2 (two) times daily. 180 tablet 3   aspirin 81 MG chewable tablet Chew 81 mg by mouth in the morning.  atorvastatin (LIPITOR) 40 MG tablet TAKE ONE TABLET BY MOUTH EVERYDAY AT BEDTIME 90 tablet 3   diltiazem (CARDIZEM CD) 180 MG 24 hr capsule Take 1 capsule (180 mg total) by mouth daily. 30 capsule 6   esomeprazole (NEXIUM) 20 MG capsule Take 20 mg by mouth daily before breakfast.     GARLIC PO Take 1 capsule by mouth daily.     metoprolol tartrate (LOPRESSOR) 100 MG tablet  Take 1 tablet (100 mg total) by mouth 2 (two) times daily. 180 tablet 3   Omega-3 Fatty Acids (FISH OIL) 1000 MG CAPS Take 1,000 mg by mouth daily.     VITAMIN D PO Take 1 capsule by mouth daily.     No current facility-administered medications for this visit.    Allergies:   Nitroglycerin   Social History:  The patient  reports that he quit smoking about 47 years ago. His smoking use included cigarettes. He smoked an average of 1 pack per day. He has quit using smokeless tobacco. He reports current alcohol use. He reports that he does not use drugs.   Family History:  The patient's family history includes Atrial fibrillation in his child; Coronary artery disease in his father; Diabetes in his mother; Hypertension in his child, father, and sister.   ROS:  Please see the history of present illness.   Otherwise, review of systems is positive for none.   All other systems are reviewed and negative.   PHYSICAL EXAM: VS:  BP 110/84   Pulse 94   Ht '5\' 6"'$  (1.676 m)   Wt 233 lb 9.6 oz (106 kg)   SpO2 97%   BMI 37.70 kg/m  , BMI Body mass index is 37.7 kg/m. GEN: Well nourished, well developed, in no acute distress  HEENT: normal  Neck: no JVD, carotid bruits, or masses Cardiac: RRR; no murmurs, rubs, or gallops,no edema  Respiratory:  clear to auscultation bilaterally, normal work of breathing GI: soft, nontender, nondistended, + BS MS: no deformity or atrophy  Skin: warm and dry Neuro:  Strength and sensation are intact Psych: euthymic mood, full affect  EKG:  EKG is ordered today. Personal review of the ekg ordered shows AF   Recent Labs: 01/28/2022: Platelets 234 04/03/2022: BUN 11; Creatinine, Ser 0.80; Hemoglobin 13.6; Potassium 3.9; Sodium 141    Lipid Panel     Component Value Date/Time   CHOL 134 03/18/2021 1225   TRIG 114 03/18/2021 1225   HDL 44 03/18/2021 1225   CHOLHDL 3.0 03/18/2021 1225   CHOLHDL 2.9 11/22/2014 0856   VLDL 29 11/22/2014 0856   LDLCALC 69  03/18/2021 1225     Wt Readings from Last 3 Encounters:  04/20/22 233 lb 9.6 oz (106 kg)  03/23/22 232 lb 3.2 oz (105.3 kg)  02/20/22 225 lb (102.1 kg)      Other studies Reviewed: Additional studies/ records that were reviewed today include: TTE 10/10/21  Review of the above records today demonstrates:   1. Left ventricular ejection fraction, by estimation, is 45-50% with beat  to beat variability. The left ventricle has mildly decreased function.  Left ventricular endocardial border not optimally defined to evaluate  regional wall motion. There is mild  left ventricular hypertrophy. Left ventricular diastolic parameters are  indeterminate.   2. Right ventricular systolic function is mildly reduced. The right  ventricular size is normal. There is normal pulmonary artery systolic  pressure. The estimated right ventricular systolic pressure is 43.3 mmHg.   3. Left  atrial size was mildly dilated.   4. The mitral valve is normal in structure. Mild mitral valve  regurgitation. No evidence of mitral stenosis.   5. The aortic valve is grossly normal. There is mild thickening of the  aortic valve. Aortic valve regurgitation is trivial. No aortic stenosis is  present.   6. The inferior vena cava is normal in size with greater than 50%  respiratory variability, suggesting right atrial pressure of 3 mmHg.    ASSESSMENT AND PLAN:  1.  Persistent atrial fibrillation: CHA2DS2-VASc of at least 3.  Currently on Xarelto 20 mg daily, metoprolol 100 mg twice daily.  He feels quite poorly in atrial fibrillation.  He had a prior attempted ablation but had left atrial appendage thrombus, confirmed 3 weeks later on TEE.  He would prefer to avoid further medications.  Due to that, we Shakeena Kafer plan for repeat ablation attempt.  Jamarco Zaldivar plan for TEE a few days prior to ablation to rule out left atrial appendage thrombus.  Sanjna Haskew not need repeat CT scan.  Risk, benefits, and alternatives to EP study and  radiofrequency ablation for afib were also discussed in detail today. These risks include but are not limited to stroke, bleeding, vascular damage, tamponade, perforation, damage to the esophagus, lungs, and other structures, pulmonary vein stenosis, worsening renal function, and death. The patient understands these risk and wishes to proceed.  We Anitria Andon therefore proceed with catheter ablation at the next available time.  Carto, ICE, anesthesia are requested for the procedure.  Khamia Stambaugh also obtain TEE prior to the procedure to exclude LAA thrombus and further evaluate atrial anatomy.   2.  Coronary artery disease: No current chest pain  3.  Hypertension: Currently well-controlled  4.  Hyperlipidemia: Continue Lipitor per primary cardiology  5.  Secondary hypercoagulable state: Currently on Eliquis for atrial fibrillation as above  6.  Obstructive sleep apnea: CPAP compliance encouraged   I was chaperoned during this clinic visit by Trinidad Curet, Myrtie Hawk, Geoffry Paradise.  Current medicines are reviewed at length with the patient today.   The patient does not have concerns regarding his medicines.  The following changes were made today:  none  Labs/ tests ordered today include:  Orders Placed This Encounter  Procedures   EKG 12-Lead     Disposition:   FU 3 months  Signed, Kendon Sedeno Meredith Leeds, MD  04/20/2022 12:23 PM     [address] [place] [address] [phone] (office) [phone] (fax)

## 2022-04-24 ENCOUNTER — Other Ambulatory Visit: Payer: Self-pay | Admitting: Cardiology

## 2022-06-01 ENCOUNTER — Ambulatory Visit: Payer: Medicare Other | Admitting: Cardiology

## 2022-06-22 ENCOUNTER — Encounter: Payer: Self-pay | Admitting: Cardiovascular Disease

## 2022-06-22 ENCOUNTER — Ambulatory Visit: Payer: Medicare Other | Attending: Cardiovascular Disease | Admitting: Cardiovascular Disease

## 2022-06-22 DIAGNOSIS — G4737 Central sleep apnea in conditions classified elsewhere: Secondary | ICD-10-CM | POA: Diagnosis not present

## 2022-06-22 DIAGNOSIS — I513 Intracardiac thrombosis, not elsewhere classified: Secondary | ICD-10-CM | POA: Insufficient documentation

## 2022-06-22 DIAGNOSIS — I4891 Unspecified atrial fibrillation: Secondary | ICD-10-CM | POA: Insufficient documentation

## 2022-06-22 DIAGNOSIS — D6869 Other thrombophilia: Secondary | ICD-10-CM | POA: Insufficient documentation

## 2022-06-22 DIAGNOSIS — I10 Essential (primary) hypertension: Secondary | ICD-10-CM | POA: Diagnosis not present

## 2022-06-22 DIAGNOSIS — I4819 Other persistent atrial fibrillation: Secondary | ICD-10-CM | POA: Diagnosis not present

## 2022-06-22 DIAGNOSIS — R600 Localized edema: Secondary | ICD-10-CM | POA: Insufficient documentation

## 2022-06-22 DIAGNOSIS — I2510 Atherosclerotic heart disease of native coronary artery without angina pectoris: Secondary | ICD-10-CM | POA: Insufficient documentation

## 2022-06-22 DIAGNOSIS — E785 Hyperlipidemia, unspecified: Secondary | ICD-10-CM | POA: Diagnosis not present

## 2022-06-22 DIAGNOSIS — G4733 Obstructive sleep apnea (adult) (pediatric): Secondary | ICD-10-CM | POA: Diagnosis not present

## 2022-06-22 MED ORDER — FUROSEMIDE 20 MG PO TABS: 20.0000 mg | ORAL_TABLET | Freq: Every day | ORAL | 3 refills | Status: DC

## 2022-06-22 NOTE — Patient Instructions (Signed)
Medication Instructions:  START furosemide (Lasix) 20 mg daily  *If you need a refill on your cardiac medications before your next appointment, please call your pharmacy*  Follow-Up: At [facility], you and your health needs are our priority.  As part of our continuing mission to provide you with exceptional heart care, we have created designated Provider Care Teams.  These Care Teams include your primary Cardiologist (physician) and Advanced Practice Providers (APPs -  Physician Assistants and Nurse Practitioners) who all work together to provide you with the care you need, when you need it.  We recommend signing up for the patient portal called "MyChart".  Sign up information is provided on this After Visit Summary.  MyChart is used to connect with patients for Virtual Visits (Telemedicine).  Patients are able to view lab/test results, encounter notes, upcoming appointments, etc.  Non-urgent messages can be sent to your provider as well.   To learn more about what you can do with MyChart, go to [email or web address].    Your next appointment:   8-9 month(s)  Provider:   Nicki Guadalajara, MD

## 2022-06-22 NOTE — Progress Notes (Signed)
Patient ID: Timothy [name], male   DOB: Mar 31, 1945, 77 y.o.   MRN: [id number]     HPI:  [name] is a 77 year old male who is a former patient of Dr.Richard Alanda [name]. He presents for a 14 month follow-up evaluation  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  He has established CAD and in 1999 underwent a self-expanding radius stent to his LAD. His last nuclear perfusion study was in May 2013 which was low risk and without ischemia an echo Doppler study showed an ejection fraction in the 55-60% range. He had mild LA dilatation. The patient has a history of sick sinus syndrome with paroxysmal atrial fibrillation and remotely had been on amiodarone but developed skin toxicity secondary to do that secondary to this. Apparently he has undergone 2 radiofrequency ablations at [facility] by Dr. Sampson [name] initially in February 2009 and he required a second procedure in December 2009 for recurrent atrial fibrillation. He had developed a recurrent episode of atrial fibrillation several years ago at which time he underwent cardioversion and has been maintaining sinus rhythm with a rate coagulation therapy.  He last saw Dr. Alanda [name] in March 2014 at which time he was felt to be stable on his current medical regimen which consisted of Lopressor 25 twice a day, multaq 400 twice a day,  Xarelto 20 mg daily in addition to his atorvastatin 20 mg and fish oil for hyperlipidemia.   In 2015 he underwent colonoscopy.  He held his Xarelto for the procedure.  He was found of 6 polyps and will require repeat colonoscopy in 3 years.  When I saw him in 2017 he had experienced one episode of atrial fibrillation several weeks prior.  This occurred after working to significant exhaustion when he was straining, lifting sheet rock and remodeling a bathroom.     I saw him in November 2018 at which time he denied  any awareness of recurrent palpitations or atrial fibrillation.  He is retired from [employer] and has a part-time job as a Orthoptist.  However, he states he often is working at least 50 hours per week in this "part-time "position.  At that time he was on atorvastatin for hyperlipidemia, lisinopril 5 mg and metoprolol tartrate 25 mg twice a day for hypertension and PAF in addition to Xarelto anticoagulation.  Laboratory by Dr. Lucila [name].  on 11/27/2016:  total cholesterol was 142, HDL 44, LDL 79, triglycerides 94.  Renal function was stable with a creatinine of 0.97.  LFTs were normal.  TSH was 1.3.    I saw him in January 2020 at which time he had  remained stable.  However  2 weeks prior to that evaluation he began to notice some URI type symptoms. While getting ready to do a church service he felt his heart become irregular and he believes he was in atrial fibrillation for 28 hours duration with a rate averaging around 110 bpm.  He did not take any extra metoprolol.  Ultimately he reverted back to sinus rhythm.  He later saw his primary physician and was diagnosed with bronchitis and received antibiotic therapy in addition to Spiriva and cough medication.  When I saw him, he denied any recurrent episode of atrial fibrillation since that episode and he denied any chest pressure.  He continued to be on Xarelto.  When I saw him in November  2020 he admitted that he continued to be stable and he denied any episodes of chest pain, exertional dyspnea or recurrent arrhythmia.  He continued to be on lisinopril 10 mg daily and metoprolol 25 mg twice a day in addition to Xarelto 20 mg daily.  He was on atorvastatin 40 mg for hyperlipidemia and continues to take over-the-counter fish oil.  He is on aspirin.  He was walking at least 5 days/week.    I saw him in December 2021.  He continued to work as a Orthoptist at [employer].   He states his hours are 50 to 60 hours/week.  At that time he was having a significant cough which I felt was most likely due to lisinopril and as result I discontinued ACE inhibition and initiated valsartan 80 mg daily.    I saw him in June 17, 2020.  His cough had improved with his medication adjustment.  He has not been very successful with weight loss.  At that time he was having  difficulty with sleeping.  He typically goes to bed between 9:30 and 10 pm and wakes up at approximately 6:30 AM.  He has nocturia 1 time per night and he does snore.  Last week he believes he had gone back into atrial fibrillation on Thursday evening and ultimately converted back to sinus rhythm after approximately 3 days on Monday.  He has experienced some ankle swelling when he has gone into atrial fibrillation.  He continued to be on Xarelto.  During that evaluation, his ECG showed sinus rhythm at 64 bpm with isolated PVC.  I was concerned that he may have obstructive sleep apnea which may be potentially contributing to his recurrent atrial fibrillation episode.  I recommended he undergo a follow-up echo Doppler study and also recommended a sleep evaluation.  On Jul 17, 2020 an echo Doppler study showed normal systolic function with EF at 60 to 65% with mild concentric LVH, and grade 2 diastolic dysfunction.  This was not significantly changed from his prior echo in January 2020.  He underwent a sleep study on August 14, 2020 and met split-night criteria.  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  CPAP was initiated at 6 cm and was titrated to optimal pressure at 16 cm of water.  He did have occasional PVCs during the study.  I saw him on October 07, 2020.  At that time, he had not yet been set up with CPAP due to supply chain issues resulting in at least a 75-month delay.  He is  going to bed between 10 and 10:30 at night and waking up at 630.  He denies chest pain.  He continues to be on valsartan 80 mg and metoprolol titrate 50 mg in the morning and 25 mg in the evening for hypertension and his PAF.  He is on atorvastatin 40 mg daily for hyperlipidemia.  He continues to take Xarelto for anticoagulation and is on Nexium for GERD.    I saw him on March 18, 2021.  Since his prior evaluation he had a brief episode of atrial fibrillation the week before Thanksgiving.  He is now taking metoprolol tartrate 50 mg twice a day in addition to his valsartan 80 mg daily for blood pressure.  He is on Xarelto for anticoagulation.  He continues to be  on Lipitor 40 mg for hyperlipidemia and Nexium for GERD.  He receives his CPAP machine on March 06, 2021 and has been on treatment for 11 days.  Presently he is set at a set pressure of 16 cm.  His average use is 6 hours and 36 minutes.  AHI is elevated at 11.3 and he had increased central events at 9.7/h.  He has been using a full facemask.  During that evaluation, I made some adjustments to his CPAP machine and increased his ramp start pressure to 6 and changed him from a fixed set pressure of 16 to an auto mode pressure range of 10 to 18 cm of water.  I last saw him on May 06, 2021.  Since his prior evaluation he felt improved with CPAP therapy.  A download was obtained from January 23 through February 21.  During this time he had a stomach virus and was afraid of throwing up and did not use CPAP for slightly over a weeks duration.  He has resumed CPAP since.  His initial mask was torn and he has now been using the full facemask from his original sleep study.  He typically goes to bed between 930 and 10 and wakes up at 6:30 AM.  His most recent download at his pressure range of 10 to 18 reveals an AHI of 9.5.  His 95th percentile pressure is 12.5 with maximum average pressure 13.5.  He is having both apneic as well as central events with a  central index of 6.8.  He denies any chest pain or significant shortness of breath.  During that evaluation, we discussed if he continued to have central events he would need to be switched to possible BiPAP therapy.  His blood pressure was stable.  He was on anticoagulation with his PAF history.  Since I last saw him, he was scheduled for an ablation with Dr. 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  His Xarelto was changed to Eliquis.  He is tentatively re-scheduled to undergo this ablation with Dr. Elberta Fortis on August 27, 2022.  Presently, he admits to swelling slightly more prominent in his right lower extremity compared to his left.  He is planning to retire in July.  He has been on a medical regimen of Eliquis 5 mg twice a day, aspirin 81 mg, diltiazem 180 mg, metoprolol tartrate 100 mg twice a day in addition to fish oil 1000 mg.  He denies any chest tightness or pressure.  He continues to use CPAP and will need to be set up with a new DME provider since [facility] providing [facility] care.  A download was obtained from March 7 through June 19, 2022.  He is compliant with usage days 90% and usage greater than 4 hours at 87%.  Average use is 7 hours and 5 minutes.  At his current pressure setting with his AirSense 11 AutoSet unit at a range of 11 to 18 cm, AHI is mildly increased at 7.2 of which 5.7 are central events.  The days he did not use CPAP therapy was when he was slept in the hospital with his wife who was hospitalized at that time.  He presents for evaluation.   Past Medical History:  Diagnosis Date   Atrial fibrillation    CAD (coronary artery disease)    LAD stent in 1999   Hyperlipidemia    Hypertension    PAF (paroxysmal atrial fibrillation)  cardioversion in 2013   SSS (sick sinus syndrome)     Past Surgical History:  Procedure Laterality Date   ATRIAL FIBRILLATION ABLATION N/A 02/20/2022   Procedure: ATRIAL FIBRILLATION  ABLATION;  Surgeon: Regan Lemming, MD;  Location: [facility] INVASIVE CV LAB;  Service: Cardiovascular;  Laterality: N/A;   CARDIAC ELECTROPHYSIOLOGY STUDY AND ABLATION  2008, 2009   RF ablation (x4) [facility] - Dr. Sampson [name])   CARDIOVERSION  06/15/2011   Procedure: CARDIOVERSION;  Surgeon: Chrystie Nose, MD;  Location: [facility] OR;  Service: Cardiovascular;  Laterality: N/A;   CARDIOVERSION N/A 09/15/2021   Procedure: CARDIOVERSION;  Surgeon: Jake Bathe, MD;  Location: [facility] ENDOSCOPY;  Service: Cardiovascular;  Laterality: N/A;   CORONARY ANGIOPLASTY WITH STENT PLACEMENT  10/29/1997   .0x20 self-expanding radius stent to LAD (Dr. Jonette Eva)   NM [facility] PERF WALL MOTION  07/2011   bruce myoview; partially fixed apical defect, worse at rest than stress; fixed inferobasal diaphragmatic attenuation artifact; no reversible ischemia; post-stress EF 60%; short run of NSVT; low risk scan    TEE WITHOUT CARDIOVERSION N/A 04/03/2022   Procedure: TRANSESOPHAGEAL ECHOCARDIOGRAM (TEE);  Surgeon: Parke Poisson, MD;  Location: [facility] ENDOSCOPY;  Service: Cardiology;  Laterality: N/A;   TEE WITHOUT CARDIOVERSION N/A 02/20/2022   Procedure: TRANSESOPHAGEAL ECHOCARDIOGRAM (TEE);  Surgeon: Regan Lemming, MD;  Location: [facility] INVASIVE CV LAB;  Service: Cardiovascular;  Laterality: N/A;   TRANSTHORACIC ECHOCARDIOGRAM  06/13/2011   EF 55-60%, mod conc LVH; LA mildly dilated    Allergies  Allergen Reactions   Nitroglycerin Other (See Comments)    "Blood Pressure goes to zero"    Current Outpatient Medications  Medication Sig Dispense Refill   apixaban (ELIQUIS) 5 MG TABS tablet Take 1 tablet (5 mg total) by mouth 2 (two) times daily. 180 tablet 3   aspirin 81 MG chewable tablet Chew 81 mg by mouth in the morning.     atorvastatin (LIPITOR) 40 MG tablet TAKE ONE TABLET BY MOUTH EVERYDAY AT BEDTIME 90 tablet 3   diltiazem (CARDIZEM CD) 180 MG 24 hr capsule TAKE ONE CAPSULE BY MOUTH ONCE DAILY 90 capsule  3   esomeprazole (NEXIUM) 20 MG capsule Take 20 mg by mouth daily before breakfast.     furosemide (LASIX) 20 MG tablet Take 1 tablet (20 mg total) by mouth daily. 90 tablet 3   GARLIC PO Take 1 capsule by mouth daily.     metoprolol tartrate (LOPRESSOR) 100 MG tablet Take 1 tablet (100 mg total) by mouth 2 (two) times daily. 180 tablet 3   Omega-3 Fatty Acids (FISH OIL) 1000 MG CAPS Take 1,000 mg by mouth daily.     VITAMIN D PO Take 1 capsule by mouth daily.     acetaminophen (TYLENOL) 650 MG CR tablet Take 1,300 mg by mouth every 8 (eight) hours as needed for pain. (Patient not taking: Reported on 06/22/2022)     No current facility-administered medications for this visit.    Social history is notable in that he is married for 48 years. He has 3 children 10 grandchildren. He is retired from Nature conservation officer business.  Family History  Problem Relation Age of Onset   Coronary artery disease Father        MI   Hypertension Father    Diabetes Mother    Hypertension Sister    Atrial fibrillation Child    Hypertension Child    ROS General: Negative; No fevers, chills, or night sweats; positive  for weight gain HEENT: Remote history of photosensitivity when he was on amiodarone; No changes in vision or hearing, sinus congestion, difficulty swallowing Pulmonary: Recent bronchitis with mild wheezing Cardiovascular: See history of present illness; No chest pain, presyncope, syncope, palpitations GI: Negative; No nausea, vomiting, diarrhea, or abdominal pain GU: Negative; No dysuria, hematuria, or difficulty voiding Musculoskeletal: Negative; no myalgias, joint pain, or weakness Hematologic/Oncology: Negative; no easy bruising, bleeding Endocrine: Negative; no heat/cold intolerance; no diabetes Neuro: Negative; no changes in balance, headaches Skin: Negative; No rashes or skin lesions Psychiatric: Negative; No behavioral problems, depression Sleep: Positive for snoring, nodaytime  sleepiness, hypersomnolence; he received a 6 ResMed air sense 11 AutoSet CPAP unit on March 06, 2021.  No Bruxism, restless legs, hypnogognic hallucinations, no cataplexy Other comprehensive 14 point system review is negative.   PE BP (!) 140/82   Pulse 86   Ht 5\' 6"  (1.676 m)   Wt 237 lb 12.8 oz (107.9 kg)   SpO2 98%   BMI 38.38 kg/m    Repeat blood pressure by me 138/82  Wt Readings from Last 3 Encounters:  06/22/22 237 lb 12.8 oz (107.9 kg)  04/20/22 233 lb 9.6 oz (106 kg)  03/23/22 232 lb 3.2 oz (105.3 kg)   General: Alert, oriented, no distress.  Skin: normal turgor, no rashes, warm and dry HEENT: Normocephalic, atraumatic. Pupils equal round and reactive to light; sclera anicteric; extraocular muscles intact;  Nose without nasal septal hypertrophy Mouth/Parynx benign; Mallinpatti scale 4 Neck: No JVD, no carotid bruits; normal carotid upstroke Lungs: clear to ausculatation and percussion; no wheezing or rales Chest wall: without tenderness to palpitation Heart: PMI not displaced, irregular irregular consistent with atrial fibrillation with rate in the 80s, s1 s2 normal, 1/6 systolic murmur, no diastolic murmur, no rubs, gallops, thrills, or heaves Abdomen: soft, nontender; no hepatosplenomehaly, BS+; abdominal aorta nontender and not dilated by palpation. Back: no CVA tenderness Pulses 2+ Musculoskeletal: full range of motion, normal strength, no joint deformities Extremities: 1-2+ right lower extremity swelling, trace to 1+ left lower extremity swelling, no clubbing cyanosis, Homan's sign negative  Neurologic: grossly nonfocal; Cranial nerves grossly wnl Psychologic: Normal mood and affect    April 8, 20248, 2024 ECG (independently read by me): Atrial fibrillation at 86, right axis, T wave abnormality  May 06, 2021 ECG (independently read by me): SInus rhythm at 69, PVCs, nonspecific T wave abnormality  March 18, 2021 ECG (independently read by me):  Sinus  bradycardia at 49, no ectopy  October 07, 2020 ECG (independently read by me): Sinus Bradycardia at 57; no ectopy, normal intervals  April 2022 ECG (independently read by me): Normal sinus rhythm at 64 bpm, PVC, QTc interval 449 ms.  December 2021 ECG (independently read by me): NSR at 62, normal intervals  January 24, 2019 ECG (independently read by me): Sinus bradycardia 55 bpm.  No ectopy.  Normal intervals.  January 2020 ECG (independently read by me): Sinus bradycardia 55 bpm.  Normal intervals.  No ectopy.  November 2018 ECG (independently read by me): Sinus bradycardia 52 bpm.  Normal intervals.  No ST segment changes.  October 2017 ECG (independently read by me): Sinus rhythm at 59 bpm.  No significant ST-T changes.  QTc interval 461 ms.  August 2015 ECG (independently read by me): Sinus bradycardia 57 beats per minute.  QTc interval 455 ms.  January  2015 ECG (independently read by me):  Sinus bradycardia 54 beats per minute. Normal intervals. No ectopy.  Prior ECG  of 01/05/2013: normal sinus rhythm at 62 beats per minute; PR interval 146 ms; QTc interval 468 ms.   LABS:    Latest Ref Rng & Units 04/03/2022   12:18 PM 01/28/2022    2:03 PM 03/18/2021   12:25 PM  BMP  Glucose 70 - 99 mg/dL 161  096  97   BUN 8 - 23 mg/dL 11  15  15    Creatinine 0.61 - 1.24 mg/dL 0.45  4.09  8.11   BUN/Creat Ratio 10 - 24  15  17    Sodium 135 - 145 mmol/L 141  141  139   Potassium 3.5 - 5.1 mmol/L 3.9  4.4  4.4   Chloride 98 - 111 mmol/L 107  105  103   CO2 20 - 29 mmol/L  21  22   Calcium 8.6 - 10.2 mg/dL  9.1  9.2       Latest Ref Rng & Units 03/18/2021   12:25 PM 04/03/2020    8:41 AM 02/15/2020    9:35 AM  Hepatic Function  Total Protein 6.0 - 8.5 g/dL 6.3  6.6  6.5   Albumin 3.7 - 4.7 g/dL 4.3  4.3  4.3   AST 0 - 40 IU/L 45  30  37   ALT 0 - 44 IU/L 59  44  58   Alk Phosphatase 44 - 121 IU/L 63  55  63   Total Bilirubin 0.0 - 1.2 mg/dL 1.0  0.6  0.9   Bilirubin, Direct 0.00 -  0.40 mg/dL  9.14        Latest Ref Rng & Units 04/03/2022   12:18 PM 01/28/2022    2:03 PM 03/18/2021   12:25 PM  CBC  WBC 3.4 - 10.8 x10E3/uL  7.6  7.3   Hemoglobin 13.0 - 17.0 g/dL 78.2  95.6  21.3   Hematocrit 39.0 - 52.0 % 40.0  42.0  41.9   Platelets 150 - 450 x10E3/uL  234  225    Lab Results  Component Value Date   MCV 91 01/28/2022   MCV 91 03/18/2021   MCV 89 02/15/2020   Lab Results  Component Value Date   TSH 1.130 03/18/2021   Lipid Panel     Component Value Date/Time   CHOL 134 03/18/2021 1225   TRIG 114 03/18/2021 1225   HDL 44 03/18/2021 1225   CHOLHDL 3.0 03/18/2021 1225   CHOLHDL 2.9 11/22/2014 0856   VLDL 29 11/22/2014 0856   LDLCALC 69 03/18/2021 1225   RADIOLOGY: No results found.  IMPRESSION: 1. 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 Coronary artery disease involving native coronary artery of native heart without angina pectoris   4. Persistent atrial fibrillation   5. Essential hypertension   6. Hypercoagulable state due to persistent atrial fibrillation   7. LA thrombus   8. Bilateral lower extremity edema   9. Hyperlipidemia LDL goal <70     ASSESSMENT AND PLAN: [name] is a 77 year-old white male who has established CAD and underwent insertion of a self-expanding Radius stent to his LAD in 1999.  His last nuclear study in May 2013 was low risk without ischemia.  He has a history of sick sinus syndrome with paroxysmal atrial fibrillation and developed amiodarone skin toxicity.  He is status post 2 radiofrequency ablations.  When I saw him in January 2020 he had experienced one episode of recurrent atrial fibrillation.  He had been without any recurrent episodes of atrial fibrillation for greater than 2 years but experienced an episode of AF which he believes lasted from 11 AM on a Sunday until 3 PM on Monday with an average ventricular rate at 110 bpm and  again experienced an episode  of atrial fibrillation in the evening on Thursday night and ultimately converted back to sinus rhythm 3 days later at the end of March/early April 2022.  When I saw him on June 17, 2020 I recommended titration of his metoprolol dose to 50 mg in the morning and 25 mg at night.  This has stabilized his heart rhythm.  Due to concerns for obstructive sleep apnea potentially contributing to his PAF, he underwent a sleep study. On the diagnostic portion of the study, he was unable to achieve any REM sleep.  He had overall mild to moderate sleep apnea but his RDI index was significantly increased in excess of 90.  He was titrated up to 16 cm water pressure with excellent response.  Unfortunately due to supply chain issues he was unable to receive his CPAP machine until March 06, 2021.  Of note, the week before Thanksgiving when he was still not on treatment, he again had an episode of recurrent AF and since that time has increased his metoprolol to 50 mg twice a day.  When evaluated on March 18, 2021 he was maintaining sinus rhythm with bradycardia.  He developed recurrent atrial fibrillation in June 2023 and had cardioversion October 05, 2021.  He has had 3 unsuccessful attempts at cardioversion subsequently and was scheduled for ablation on February 20, 2022 with Dr. Elberta Fortisamnitz but was found to have left atrial appendage thrombus on preablation CT imaging and TEE evaluation.  Thrombus was still present on April 03, 2022 although smaller.  He is now on Eliquis instead of Xarelto for anticoagulation and is scheduled to undergo ablation on August 27, 2022 with Dr. Elberta Fortisamnitz.  He has continued to use CPAP therapy.  His most recent download confirms compliance but he continues to have an AHI of 7.20 which 5.7 central apneic events.  I will change his pressure setting from his current pressure of 11 to 18 cm to a pressure range of 9 to 16 cm of water.  He will need to change to a new DME company based on his insurance.  If he  continues to have significant central events, he may require BiPAP therapy or possible ASV titration.  His blood pressure today is mildly increased at 138/82 by my assessment.  He has had issues with lower extremity edema.  I suspect some of this edema may be contributed till diltiazem.  I have recommended the addition of furosemide 20 mg which should help both blood pressure as well as edema benefit.  I will see him in 6 to 8 months for follow-up evaluation.     Timothy Biharihomas A. Felipa Laroche, MD, [facility] HospitalFACC 06/27/2022 1:39 PM

## 2022-06-27 ENCOUNTER — Encounter: Payer: Self-pay | Admitting: Cardiovascular Disease

## 2022-07-06 ENCOUNTER — Ambulatory Visit: Payer: Medicare Other | Attending: Cardiology | Admitting: Cardiology

## 2022-07-06 ENCOUNTER — Encounter: Payer: Self-pay | Admitting: Cardiology

## 2022-07-06 VITALS — BP 120/74 | HR 99 | Ht 66.0 in | Wt 236.0 lb

## 2022-07-06 DIAGNOSIS — G4733 Obstructive sleep apnea (adult) (pediatric): Secondary | ICD-10-CM | POA: Insufficient documentation

## 2022-07-06 DIAGNOSIS — I10 Essential (primary) hypertension: Secondary | ICD-10-CM | POA: Diagnosis not present

## 2022-07-06 DIAGNOSIS — I4819 Other persistent atrial fibrillation: Secondary | ICD-10-CM | POA: Insufficient documentation

## 2022-07-06 DIAGNOSIS — I2510 Atherosclerotic heart disease of native coronary artery without angina pectoris: Secondary | ICD-10-CM | POA: Insufficient documentation

## 2022-07-06 DIAGNOSIS — D6869 Other thrombophilia: Secondary | ICD-10-CM | POA: Insufficient documentation

## 2022-07-06 NOTE — Progress Notes (Signed)
Electrophysiology Office Note   Date:  07/06/2022   ID:  Timothy [name], Timothy [name] 07-Mar-1946, MRN [id number]  PCP:  Street, Stephanie Coup, MD  Cardiologist:  Tresa Endo Primary Electrophysiologist:  Verdia Bolt Jorja Loa, MD    Chief Complaint: AF   History of Present Illness: Timothy [name] is a 77 y.o. male who is being seen today for the evaluation of AF at the request of Street, Stephanie Coup, *. Presenting today for electrophysiology evaluation.  He has a history significant for coronary artery disease, hypertension, hyperlipidemia, sick sinus syndrome, atrial fibrillation.  He had LAD stent in 2019.  He is previously on amiodarone but developed skin toxicity.  He had ablation x 2 at [facility] in 2009.  June 2023 he was found to be in atrial fibrillation.  He had a cardioversion 10/05/2021.  At that time he had 3 unsuccessful attempts at cardioversion.  He was brought in for ablation 02/20/2022 was found to have a left atrial appendage thrombus.  He switched to Eliquis.  Thrombus was confirmed on TEE 04/03/2022 though the thrombus was smaller.  Today, denies symptoms of palpitations, chest pain, shortness of breath, orthopnea, PND, lower extremity edema, claudication, dizziness, presyncope, syncope, bleeding, or neurologic sequela. The patient is tolerating medications without difficulties.  Today he feels quite tired and fatigued.  This is his baseline since he has been in atrial fibrillation.  He is ready for his ablation.   Past Medical History:  Diagnosis Date   Atrial fibrillation    CAD (coronary artery disease)    LAD stent in 1999   Hyperlipidemia    Hypertension    PAF (paroxysmal atrial fibrillation)    cardioversion in 2013   SSS (sick sinus syndrome)    Past Surgical History:  Procedure Laterality Date   ATRIAL FIBRILLATION ABLATION N/A 02/20/2022   Procedure: ATRIAL FIBRILLATION ABLATION;  Surgeon: Regan Lemming, MD;  Location: [facility] INVASIVE CV LAB;  Service:  Cardiovascular;  Laterality: N/A;   CARDIAC ELECTROPHYSIOLOGY STUDY AND ABLATION  2008, 2009   RF ablation (x4) [facility] - Dr. Sampson Goon)   CARDIOVERSION  06/15/2011   Procedure: CARDIOVERSION;  Surgeon: Chrystie Nose, MD;  Location: [facility] OR;  Service: Cardiovascular;  Laterality: N/A;   CARDIOVERSION N/A 09/15/2021   Procedure: CARDIOVERSION;  Surgeon: Jake Bathe, MD;  Location: [facility] ENDOSCOPY;  Service: Cardiovascular;  Laterality: N/A;   CORONARY ANGIOPLASTY WITH STENT PLACEMENT  10/29/1997   .0x20 self-expanding radius stent to LAD (Dr. Jonette Eva)   NM [facility] PERF WALL MOTION  07/2011   bruce myoview; partially fixed apical defect, worse at rest than stress; fixed inferobasal diaphragmatic attenuation artifact; no reversible ischemia; post-stress EF 60%; short run of NSVT; low risk scan    TEE WITHOUT CARDIOVERSION N/A 04/03/2022   Procedure: TRANSESOPHAGEAL ECHOCARDIOGRAM (TEE);  Surgeon: Parke Poisson, MD;  Location: [facility] ENDOSCOPY;  Service: Cardiology;  Laterality: N/A;   TEE WITHOUT CARDIOVERSION N/A 02/20/2022   Procedure: TRANSESOPHAGEAL ECHOCARDIOGRAM (TEE);  Surgeon: Regan Lemming, MD;  Location: [facility] INVASIVE CV LAB;  Service: Cardiovascular;  Laterality: N/A;   TRANSTHORACIC ECHOCARDIOGRAM  06/13/2011   EF 55-60%, mod conc LVH; LA mildly dilated     Current Outpatient Medications  Medication Sig Dispense Refill   apixaban (ELIQUIS) 5 MG TABS tablet Take 1 tablet (5 mg total) by mouth 2 (two) times daily. 180 tablet 3   aspirin 81 MG chewable tablet Chew 81 mg by mouth in the morning.  atorvastatin (LIPITOR) 40 MG tablet TAKE ONE TABLET BY MOUTH EVERYDAY AT BEDTIME 90 tablet 3   diltiazem (CARDIZEM CD) 180 MG 24 hr capsule TAKE ONE CAPSULE BY MOUTH ONCE DAILY 90 capsule 3   esomeprazole (NEXIUM) 20 MG capsule Take 20 mg by mouth daily before breakfast.     furosemide (LASIX) 20 MG tablet Take 1 tablet (20 mg total) by mouth daily. 90 tablet 3   GARLIC PO  Take 1 capsule by mouth daily.     metoprolol tartrate (LOPRESSOR) 100 MG tablet Take 1 tablet (100 mg total) by mouth 2 (two) times daily. 180 tablet 3   Omega-3 Fatty Acids (FISH OIL) 1000 MG CAPS Take 1,000 mg by mouth daily.     VITAMIN D PO Take 1 capsule by mouth daily.     acetaminophen (TYLENOL) 650 MG CR tablet Take 1,300 mg by mouth every 8 (eight) hours as needed for pain. (Patient not taking: Reported on 07/06/2022)     No current facility-administered medications for this visit.    Allergies:   Nitroglycerin   Social History:  The patient  reports that he quit smoking about 47 years ago. His smoking use included cigarettes. He smoked an average of 1 pack per day. He has quit using smokeless tobacco. He reports current alcohol use. He reports that he does not use drugs.   Family History:  The patient's family history includes Atrial fibrillation in his child; Coronary artery disease in his father; Diabetes in his mother; Hypertension in his child, father, and sister.   ROS:  Please see the history of present illness.   Otherwise, review of systems is positive for none.   All other systems are reviewed and negative.   PHYSICAL EXAM: VS:  BP 120/74   Pulse 99   Ht  (1.676 m)   Wt 236 lb (107 kg)   SpO2 98%   BMI 38.09 kg/m  , BMI Body mass index is 38.09 kg/m. GEN: Well nourished, well developed, in no acute distress  HEENT: normal  Neck: no JVD, carotid bruits, or masses Cardiac: RRR; no murmurs, rubs, or gallops,no edema  Respiratory:  clear to auscultation bilaterally, normal work of breathing GI: soft, nontender, nondistended, + BS MS: no deformity or atrophy  Skin: warm and dry Neuro:  Strength and sensation are intact Psych: euthymic mood, full affect  EKG:  EKG is ordered today. Personal review of the ekg ordered shows AF   Recent Labs: 01/28/2022: Platelets 234 04/03/2022: BUN 11; Creatinine, Ser 0.80; Hemoglobin 13.6; Potassium 3.9; Sodium 141     Lipid Panel     Component Value Date/Time   CHOL 134 03/18/2021 1225   TRIG 114 03/18/2021 1225   HDL 44 03/18/2021 1225   CHOLHDL 3.0 03/18/2021 1225   CHOLHDL 2.9 11/22/2014 0856   VLDL 29 11/22/2014 0856   LDLCALC 69 03/18/2021 1225     Wt Readings from Last 3 Encounters:  07/06/22 236 lb (107 kg)  06/22/22 237 lb 12.8 oz (107.9 kg)  04/20/22 233 lb 9.6 oz (106 kg)      Other studies Reviewed: Additional studies/ records that were reviewed today include: TTE 10/10/21  Review of the above records today demonstrates:   1. Left ventricular ejection fraction, by estimation, is 45-50% with beat  to beat variability. The left ventricle has mildly decreased function.  Left ventricular endocardial border not optimally defined to evaluate  regional wall motion. There is mild  left ventricular hypertrophy. Left  ventricular diastolic parameters are  indeterminate.   2. Right ventricular systolic function is mildly reduced. The right  ventricular size is normal. There is normal pulmonary artery systolic  pressure. The estimated right ventricular systolic pressure is 19.0 mmHg.   3. Left atrial size was mildly dilated.   4. The mitral valve is normal in structure. Mild mitral valve  regurgitation. No evidence of mitral stenosis.   5. The aortic valve is grossly normal. There is mild thickening of the  aortic valve. Aortic valve regurgitation is trivial. No aortic stenosis is  present.   6. The inferior vena cava is normal in size with greater than 50%  respiratory variability, suggesting right atrial pressure of 3 mmHg.    ASSESSMENT AND PLAN:  1.  Persistent atrial fibrillation: Currently on Eliquis and metoprolol.  CHA2DS2-VASc at least 3.  Plan for ablation 08/27/2022.  Did have prior ablation attempt December 2023 but was found to have a left atrial appendage thrombus and was switched from Xarelto to Eliquis.  Has plans for repeat ablation attempt.  Risk and benefits have  been discussed previously.  Timothy [name] plan for TEE prior to ablation.  2.  Coronary disease: No current chest pain  3.  Hypertension: Currently well-controlled  4.  Hyperlipidemia: Continue Lipitor per primary cardiology  5.  Secondary hypercoagulable state: Currently on Eliquis for atrial fibrillation  6.  Obstructive sleep apnea: CPAP compliance encouraged  Current medicines are reviewed at length with the patient today.   The patient does not have concerns regarding his medicines.  The following changes were made today: None  Labs/ tests ordered today include:  Orders Placed This Encounter  Procedures   EKG 12-Lead     Disposition:   FU 5 months  Signed, Ramadan Couey Jorja Loa, MD  07/06/2022 8:33 AM     [facility] [address] [place] [address] [phone] (office) [phone] (fax)

## 2022-07-10 ENCOUNTER — Encounter: Payer: Self-pay | Admitting: *Deleted

## 2022-07-10 ENCOUNTER — Telehealth: Payer: Self-pay | Admitting: Cardiology

## 2022-07-10 NOTE — Telephone Encounter (Signed)
Spoke with the patient and advised that the date was an error. His TEE is on June 6th and his ablation is on June 13th. Patient verbalized understanding.

## 2022-07-10 NOTE — Telephone Encounter (Signed)
Pt is calling regarding his TEE scheduled for 6/6 and his ablation scheduled for 6/13. He states he received a letter from [facility] stating his TEE is scheduled on 6/13 now. He is confused and would like clarification as his ablation is scheduled on 6/13. Please advise.

## 2022-07-24 ENCOUNTER — Encounter: Payer: Self-pay | Admitting: Cardiology

## 2022-08-11 ENCOUNTER — Ambulatory Visit (HOSPITAL_COMMUNITY)
Admission: RE | Admit: 2022-08-11 | Discharge: 2022-08-11 | Disposition: A | Discharge status: Home / Self Care | Payer: Medicare Other | Source: Ambulatory Visit | Attending: Physician Assistant | Admitting: Physician Assistant

## 2022-08-11 VITALS — BP 126/82 | HR 88 | Ht 66.0 in | Wt 232.6 lb

## 2022-08-11 DIAGNOSIS — E785 Hyperlipidemia, unspecified: Secondary | ICD-10-CM | POA: Insufficient documentation

## 2022-08-11 DIAGNOSIS — E669 Obesity, unspecified: Secondary | ICD-10-CM | POA: Insufficient documentation

## 2022-08-11 DIAGNOSIS — Z79899 Other long term (current) drug therapy: Secondary | ICD-10-CM | POA: Insufficient documentation

## 2022-08-11 DIAGNOSIS — I10 Essential (primary) hypertension: Secondary | ICD-10-CM | POA: Insufficient documentation

## 2022-08-11 DIAGNOSIS — G4733 Obstructive sleep apnea (adult) (pediatric): Secondary | ICD-10-CM | POA: Insufficient documentation

## 2022-08-11 DIAGNOSIS — D6869 Other thrombophilia: Secondary | ICD-10-CM | POA: Insufficient documentation

## 2022-08-11 DIAGNOSIS — Z6837 Body mass index (BMI) 37.0-37.9, adult: Secondary | ICD-10-CM | POA: Insufficient documentation

## 2022-08-11 DIAGNOSIS — Z7901 Long term (current) use of anticoagulants: Secondary | ICD-10-CM | POA: Diagnosis not present

## 2022-08-11 DIAGNOSIS — I2510 Atherosclerotic heart disease of native coronary artery without angina pectoris: Secondary | ICD-10-CM | POA: Diagnosis not present

## 2022-08-11 DIAGNOSIS — I4819 Other persistent atrial fibrillation: Secondary | ICD-10-CM | POA: Diagnosis not present

## 2022-08-11 LAB — CBC
HCT: 43.5 % (ref 39.0–52.0)
Hemoglobin: 14.7 g/dL (ref 13.0–17.0)
MCH: 31 pg (ref 26.0–34.0)
MCHC: 33.8 g/dL (ref 30.0–36.0)
MCV: 91.8 fL (ref 80.0–100.0)
Platelets: 220 10*3/uL (ref 150–400)
RBC: 4.74 MIL/uL (ref 4.22–5.81)
RDW: 13.2 % (ref 11.5–15.5)
WBC: 7.1 10*3/uL (ref 4.0–10.5)
nRBC: 0 % (ref 0.0–0.2)

## 2022-08-11 LAB — BASIC METABOLIC PANEL
Anion gap: 8 (ref 5–15)
BUN: 13 mg/dL (ref 8–23)
CO2: 26 mmol/L (ref 22–32)
Calcium: 8 mg/dL — ABNORMAL LOW (ref 8.9–10.3)
Chloride: 104 mmol/L (ref 98–111)
Creatinine, Ser: 0.97 mg/dL (ref 0.61–1.24)
GFR, Estimated: >60 mL/min (ref >60)
Glucose, Bld: 144 mg/dL — ABNORMAL HIGH (ref 70–99)
Potassium: 4 mmol/L (ref 3.5–5.1)
Sodium: 138 mmol/L (ref 135–145)

## 2022-08-11 NOTE — Progress Notes (Signed)
Primary Care Physician: Street, Stephanie Coup, MD Primary Cardiologist: Dr Tresa Endo Primary Electrophysiologist: Dr Elberta Fortis  Referring Physician: Dr Lucky Rathke is a 77 y.o. male with a history of CAD, HLD, HTN, OSA, atrial fibrillation who presents for follow up in the [facility]. He was previously on amiodarone but developed skin toxicity. He had ablation x2 at [facility] by Dr. Sampson Goon in 2009. He has obstructive sleep apnea. June 2023 was found to be in atrial fibrillation. He had a cardioversion 10/05/2021. At that time, he had 3 unsuccessful attempts at cardioversion. Patient is on Eliquis for a CHADS2VASC score of 3. Patient was scheduled for ablation with Dr Elberta Fortis but the pre ablation CT and TEE showed LA thrombus and the surgery was cancelled. His Xarelto was changed to Eliquis. TEE on 04/03/22 showed persistent but smaller thrombus. He is scheduled for ablation on 08/27/22 with another TEE the week prior.  On follow up today, patient remains in rate controlled afib with symptoms of fatigue. He denies any missed doses of anticoagulation.   Today, he denies symptoms of palpitations, chest pain, shortness of breath, orthopnea, PND, lower extremity edema, dizziness, presyncope, syncope, snoring, daytime somnolence, bleeding, or neurologic sequela. The patient is tolerating medications without difficulties and is otherwise without complaint today.    Atrial Fibrillation Risk Factors:  he does have symptoms or diagnosis of sleep apnea. he is compliant with CPAP therapy. he does not have a history of rheumatic fever.   he has a BMI of Body mass index is 37.54 kg/m.Marland Kitchen Filed Weights   08/11/22 0831  Weight: 105.5 kg   Family History  Problem Relation Age of Onset   Coronary artery disease Father        MI   Hypertension Father    Diabetes Mother    Hypertension Sister    Atrial fibrillation Child    Hypertension Child       Atrial Fibrillation Management history:  Previous antiarrhythmic drugs: amiodarone  Previous cardioversions: several, most recently 10/05/21 Previous ablations: 2008, 2009 CHADS2VASC score: 3 Anticoagulation history: Xarelto, Eliquis   Past Medical History:  Diagnosis Date   Atrial fibrillation (HCC)    CAD (coronary artery disease)    LAD stent in 1999   Hyperlipidemia    Hypertension    PAF (paroxysmal atrial fibrillation) (HCC)    cardioversion in 2013   SSS (sick sinus syndrome) (HCC)    Past Surgical History:  Procedure Laterality Date   ATRIAL FIBRILLATION ABLATION N/A 02/20/2022   Procedure: ATRIAL FIBRILLATION ABLATION;  Surgeon: Regan Lemming, MD;  [facility]: [facility] CV LAB;  Service: Cardiovascular;  Laterality: N/A;   CARDIAC ELECTROPHYSIOLOGY STUDY AND ABLATION  2008, 2009   RF ablation (x4) [facility] - Dr. Sampson Goon)   CARDIOVERSION  06/15/2011   Procedure: CARDIOVERSION;  Surgeon: Chrystie Nose, MD;  Location: [facility] OR;  Service: Cardiovascular;  Laterality: N/A;   CARDIOVERSION N/A 09/15/2021   Procedure: CARDIOVERSION;  Surgeon: Jake Bathe, MD;  Location: [facility] ENDOSCOPY;  Service: Cardiovascular;  Laterality: N/A;   CORONARY ANGIOPLASTY WITH STENT PLACEMENT  10/29/1997   .0x20 self-expanding radius stent to LAD (Dr. Jonette Eva)   NM [facility] PERF WALL MOTION  07/2011   bruce myoview; partially fixed apical defect, worse at rest than stress; fixed inferobasal diaphragmatic attenuation artifact; no reversible ischemia; post-stress EF 60%; short run of NSVT; low risk scan    TEE WITHOUT CARDIOVERSION N/A 04/03/2022  Procedure: TRANSESOPHAGEAL ECHOCARDIOGRAM (TEE);  Surgeon: Parke Poisson, MD;  Location: [facility] ENDOSCOPY;  Service: Cardiology;  Laterality: N/A;   TEE WITHOUT CARDIOVERSION N/A 02/20/2022   Procedure: TRANSESOPHAGEAL ECHOCARDIOGRAM (TEE);  Surgeon: Regan Lemming, MD;  Location: [facility] INVASIVE CV LAB;  Service: Cardiovascular;   Laterality: N/A;   TRANSTHORACIC ECHOCARDIOGRAM  06/13/2011   EF 55-60%, mod conc LVH; LA mildly dilated    Current Outpatient Medications  Medication Sig Dispense Refill   acetaminophen (TYLENOL) 650 MG CR tablet Take 1,300 mg by mouth as needed for pain.     apixaban (ELIQUIS) 5 MG TABS tablet Take 1 tablet (5 mg total) by mouth 2 (two) times daily. 180 tablet 3   aspirin 81 MG chewable tablet Chew 81 mg by mouth in the morning.     atorvastatin (LIPITOR) 40 MG tablet TAKE ONE TABLET BY MOUTH EVERYDAY AT BEDTIME 90 tablet 3   diltiazem (CARDIZEM CD) 180 MG 24 hr capsule TAKE ONE CAPSULE BY MOUTH ONCE DAILY 90 capsule 3   esomeprazole (NEXIUM) 20 MG capsule Take 20 mg by mouth daily before breakfast.     furosemide (LASIX) 20 MG tablet Take 1 tablet (20 mg total) by mouth daily. 90 tablet 3   GARLIC PO Take 1 capsule by mouth daily.     loratadine (CLARITIN) 10 MG tablet Take 10 mg by mouth as needed for allergies.     metoprolol tartrate (LOPRESSOR) 100 MG tablet Take 1 tablet (100 mg total) by mouth 2 (two) times daily. 180 tablet 3   Omega-3 Fatty Acids (FISH OIL) 1000 MG CAPS Take 1,000 mg by mouth daily.     VITAMIN D PO Take 1 capsule by mouth daily.     No current facility-administered medications for this encounter.    Allergies  Allergen Reactions   Nitroglycerin Other (See Comments)    "Blood Pressure goes to zero"    Social History   Socioeconomic History   Marital status: Married    Spouse name: Not on file   Number of children: 3   Years of education: Not on file   Highest education level: Not on file  Occupational History   Not on file  Tobacco Use   Smoking status: Former    Packs/day: 1    Types: Cigarettes    Quit date: 04/07/1975    Years since quitting: 47.3   Smokeless tobacco: Former   Tobacco comments:    quit smoking 1973. quit smokeless about 30 years ago.  Substance and Sexual Activity   Alcohol use: Yes   Drug use: No   Sexual activity: Not  on file  Other Topics Concern   Not on file  Social History Narrative   Not on file   Social Determinants of Health   Financial Resource Strain: Not on file  Food Insecurity: Not on file  Transportation Needs: Not on file  Physical Activity: Not on file  Stress: Not on file  Social Connections: Not on file  Intimate Partner Violence: Not on file     ROS- All systems are reviewed and negative except as per the HPI above.  Physical Exam: Vitals:   08/11/22 0831  BP: 126/82  Pulse: 88  Weight: 105.5 kg  Height: 5\' 6"  (1.676 m)     GEN- The patient is a well appearing male, alert and oriented x 3 today.   HEENT-head normocephalic, atraumatic, sclera clear, conjunctiva pink, hearing intact, trachea midline. Lungs- Clear to ausculation bilaterally, normal work  of breathing Heart- irregular rate and rhythm, no murmurs, rubs or gallops  GI- soft, NT, ND, + BS Extremities- no clubbing, cyanosis, or edema MS- no significant deformity or atrophy Skin- no rash or lesion Psych- euthymic mood, full affect Neuro- strength and sensation are intact   Wt Readings from Last 3 Encounters:  08/11/22 105.5 kg  07/06/22 107 kg  06/22/22 107.9 kg    EKG today demonstrates  Afib Vent. rate 88 BPM PR interval * ms QRS duration 106 ms QT/QTcB 388/469 ms   Echo 10/10/21 demonstrated   1. Left ventricular ejection fraction, by estimation, is 45-50% with beat  to beat variability. The left ventricle has mildly decreased function.  Left ventricular endocardial border not optimally defined to evaluate  regional wall motion. There is mild left ventricular hypertrophy. Left ventricular diastolic parameters are indeterminate.   2. Right ventricular systolic function is mildly reduced. The right  ventricular size is normal. There is normal pulmonary artery systolic  pressure. The estimated right ventricular systolic pressure is 19.0 mmHg.   3. Left atrial size was mildly dilated.   4. The  mitral valve is normal in structure. Mild mitral valve  regurgitation. No evidence of mitral stenosis.   5. The aortic valve is grossly normal. There is mild thickening of the  aortic valve. Aortic valve regurgitation is trivial. No aortic stenosis is  present.   6. The inferior vena cava is normal in size with greater than 50%  respiratory variability, suggesting right atrial pressure of 3 mmHg.   Comparison(s): A prior study was performed on 07/17/20. Prior images  reviewed side by side. LV function has decreased compared to prior exam.    Epic records are reviewed at length today  CHA2DS2-VASc Score = 3  The patient's score is based upon: CHF History: 0 HTN History: 1 Diabetes History: 0 Stroke History: 0 Vascular Disease History: 1 Age Score: 1 Gender Score: 0       ASSESSMENT AND PLAN: 1. Persistent Atrial Fibrillation (ICD10:  I48.19) The patient's CHA2DS2-VASc score is 3, indicating a 3.2% annual risk of stroke.   Patient in symptomatic rate controlled afib.  Patient scheduled for ablation 08/27/22. Repeat TEE scheduled for 08/20/22 to evaluate for LAA thrombus. Continue Eliquis 5 mg BID Continue Lopressor 100 mg BID Continue diltiazem 180 mg daily  2. Secondary Hypercoagulable State (ICD10:  D68.69) The patient is at significant risk for stroke/thromboembolism based upon his CHA2DS2-VASc Score of 3.  Continue Apixaban (Eliquis).   3. Obesity Body mass index is 37.54 kg/m. Lifestyle modification was discussed and encouraged including regular physical activity and weight reduction.  4. Obstructive sleep apnea Encouraged compliance with CPAP therapy.  5. CAD No anginal symptoms.  6. HTN Stable, no changes today.   Follow up for ablation as scheduled.    Jorja Loa PA-C [facility] [address] [place], [place] [address] [phone] 08/11/2022 9:02 AM

## 2022-08-11 NOTE — H&P (View-Only) (Signed)
  Primary Care Physician: Street, Christopher M, MD Primary Cardiologist: Dr Kelly Primary Electrophysiologist: Dr Camnitz  Referring Physician: Dr Camnitz   Timothy [name] is a 76 y.o. male with a history of CAD, HLD, HTN, OSA, atrial fibrillation who presents for follow up in the [facility]. He was previously on amiodarone but developed skin toxicity. He had ablation x2 at [facility] by Dr. Fitzgerald in 2009. He has obstructive sleep apnea. June 2023 was found to be in atrial fibrillation. He had a cardioversion 10/05/2021. At that time, he had 3 unsuccessful attempts at cardioversion. Patient is on Eliquis for a CHADS2VASC score of 3. Patient was scheduled for ablation with Dr Camnitz but the pre ablation CT and TEE showed LA thrombus and the surgery was cancelled. His Xarelto was changed to Eliquis. TEE on 04/03/22 showed persistent but smaller thrombus. He is scheduled for ablation on 08/27/22 with another TEE the week prior.  On follow up today, patient remains in rate controlled afib with symptoms of fatigue. He denies any missed doses of anticoagulation.   Today, he denies symptoms of palpitations, chest pain, shortness of breath, orthopnea, PND, lower extremity edema, dizziness, presyncope, syncope, snoring, daytime somnolence, bleeding, or neurologic sequela. The patient is tolerating medications without difficulties and is otherwise without complaint today.    Atrial Fibrillation Risk Factors:  he does have symptoms or diagnosis of sleep apnea. he is compliant with CPAP therapy. he does not have a history of rheumatic fever.   he has a BMI of Body mass index is 37.54 kg/m.. Filed Weights   08/11/22 0831  Weight: 105.5 kg   Family History  Problem Relation Age of Onset   Coronary artery disease Father        MI   Hypertension Father    Diabetes Mother    Hypertension Sister    Atrial fibrillation Child    Hypertension Child       Atrial Fibrillation Management history:  Previous antiarrhythmic drugs: amiodarone  Previous cardioversions: several, most recently 10/05/21 Previous ablations: 2008, 2009 CHADS2VASC score: 3 Anticoagulation history: Xarelto, Eliquis   Past Medical History:  Diagnosis Date   Atrial fibrillation (HCC)    CAD (coronary artery disease)    LAD stent in 1999   Hyperlipidemia    Hypertension    PAF (paroxysmal atrial fibrillation) (HCC)    cardioversion in 2013   SSS (sick sinus syndrome) (HCC)    Past Surgical History:  Procedure Laterality Date   ATRIAL FIBRILLATION ABLATION N/A 02/20/2022   Procedure: ATRIAL FIBRILLATION ABLATION;  Surgeon: Camnitz, Will Martin, MD;  [facility];  Service: Cardiovascular;  Laterality: N/A;   CARDIAC ELECTROPHYSIOLOGY STUDY AND ABLATION  2008, 2009   RF ablation (x4) - [facility] - Dr. Fitzgerald)   CARDIOVERSION  06/15/2011   Procedure: CARDIOVERSION;  Surgeon: Kenneth C. Hilty, MD;  Location: [facility];  Service: Cardiovascular;  Laterality: N/A;   CARDIOVERSION N/A 09/15/2021   Procedure: CARDIOVERSION;  Surgeon: Skains, Mark C, MD;  [facility]: [facility];  Service: Cardiovascular;  Laterality: N/A;   CORONARY ANGIOPLASTY WITH STENT PLACEMENT  10/29/1997   .0x20 self-expanding radius stent to LAD (Dr. R. Weintraub)   NM MYOCAR PERF WALL MOTION  07/2011   bruce myoview; partially fixed apical defect, worse at rest than stress; fixed inferobasal diaphragmatic attenuation artifact; no reversible ischemia; post-stress EF 60%; short run of NSVT; low risk scan    TEE WITHOUT CARDIOVERSION N/A 04/03/2022     Procedure: TRANSESOPHAGEAL ECHOCARDIOGRAM (TEE);  Surgeon: Acharya, Gayatri A, MD;  [facility]: [facility];  Service: Cardiology;  Laterality: N/A;   TEE WITHOUT CARDIOVERSION N/A 02/20/2022   Procedure: TRANSESOPHAGEAL ECHOCARDIOGRAM (TEE);  Surgeon: Camnitz, Will Martin, MD;  [facility];  Service: Cardiovascular;   Laterality: N/A;   TRANSTHORACIC ECHOCARDIOGRAM  06/13/2011   EF 55-60%, mod conc LVH; LA mildly dilated    Current Outpatient Medications  Medication Sig Dispense Refill   acetaminophen (TYLENOL) 650 MG CR tablet Take 1,300 mg by mouth as needed for pain.     apixaban (ELIQUIS) 5 MG TABS tablet Take 1 tablet (5 mg total) by mouth 2 (two) times daily. 180 tablet 3   aspirin 81 MG chewable tablet Chew 81 mg by mouth in the morning.     atorvastatin (LIPITOR) 40 MG tablet TAKE ONE TABLET BY MOUTH EVERYDAY AT BEDTIME 90 tablet 3   diltiazem (CARDIZEM CD) 180 MG 24 hr capsule TAKE ONE CAPSULE BY MOUTH ONCE DAILY 90 capsule 3   esomeprazole (NEXIUM) 20 MG capsule Take 20 mg by mouth daily before breakfast.     furosemide (LASIX) 20 MG tablet Take 1 tablet (20 mg total) by mouth daily. 90 tablet 3   GARLIC PO Take 1 capsule by mouth daily.     loratadine (CLARITIN) 10 MG tablet Take 10 mg by mouth as needed for allergies.     metoprolol tartrate (LOPRESSOR) 100 MG tablet Take 1 tablet (100 mg total) by mouth 2 (two) times daily. 180 tablet 3   Omega-3 Fatty Acids (FISH OIL) 1000 MG CAPS Take 1,000 mg by mouth daily.     VITAMIN D PO Take 1 capsule by mouth daily.     No current facility-administered medications for this encounter.    Allergies  Allergen Reactions   Nitroglycerin Other (See Comments)    "Blood Pressure goes to zero"    Social History   Socioeconomic History   Marital status: Married    Spouse name: Not on file   Number of children: 3   Years of education: Not on file   Highest education level: Not on file  Occupational History   Not on file  Tobacco Use   Smoking status: Former    Packs/day: 1    Types: Cigarettes    Quit date: 04/07/1975    Years since quitting: 47.3   Smokeless tobacco: Former   Tobacco comments:    quit smoking 1973. quit smokeless about 30 years ago.  Substance and Sexual Activity   Alcohol use: Yes   Drug use: No   Sexual activity: Not  on file  Other Topics Concern   Not on file  Social History Narrative   Not on file   Social Determinants of Health   Financial Resource Strain: Not on file  Food Insecurity: Not on file  Transportation Needs: Not on file  Physical Activity: Not on file  Stress: Not on file  Social Connections: Not on file  Intimate Partner Violence: Not on file     ROS- All systems are reviewed and negative except as per the HPI above.  Physical Exam: Vitals:   08/11/22 0831  BP: 126/82  Pulse: 88  Weight: 105.5 kg  Height: 5' 6" (1.676 m)     GEN- The patient is a well appearing male, alert and oriented x 3 today.   HEENT-head normocephalic, atraumatic, sclera clear, conjunctiva pink, hearing intact, trachea midline. Lungs- Clear to ausculation bilaterally, normal work   of breathing Heart- irregular rate and rhythm, no murmurs, rubs or gallops  GI- soft, NT, ND, + BS Extremities- no clubbing, cyanosis, or edema MS- no significant deformity or atrophy Skin- no rash or lesion Psych- euthymic mood, full affect Neuro- strength and sensation are intact   Wt Readings from Last 3 Encounters:  08/11/22 105.5 kg  07/06/22 107 kg  06/22/22 107.9 kg    EKG today demonstrates  Afib Vent. rate 88 BPM PR interval * ms QRS duration 106 ms QT/QTcB 388/469 ms   Echo 10/10/21 demonstrated   1. Left ventricular ejection fraction, by estimation, is 45-50% with beat  to beat variability. The left ventricle has mildly decreased function.  Left ventricular endocardial border not optimally defined to evaluate  regional wall motion. There is mild left ventricular hypertrophy. Left ventricular diastolic parameters are indeterminate.   2. Right ventricular systolic function is mildly reduced. The right  ventricular size is normal. There is normal pulmonary artery systolic  pressure. The estimated right ventricular systolic pressure is 19.0 mmHg.   3. Left atrial size was mildly dilated.   4. The  mitral valve is normal in structure. Mild mitral valve  regurgitation. No evidence of mitral stenosis.   5. The aortic valve is grossly normal. There is mild thickening of the  aortic valve. Aortic valve regurgitation is trivial. No aortic stenosis is  present.   6. The inferior vena cava is normal in size with greater than 50%  respiratory variability, suggesting right atrial pressure of 3 mmHg.   Comparison(s): A prior study was performed on 07/17/20. Prior images  reviewed side by side. LV function has decreased compared to prior exam.    Epic records are reviewed at length today  CHA2DS2-VASc Score = 3  The patient's score is based upon: CHF History: 0 HTN History: 1 Diabetes History: 0 Stroke History: 0 Vascular Disease History: 1 Age Score: 1 Gender Score: 0       ASSESSMENT AND PLAN: 1. Persistent Atrial Fibrillation (ICD10:  I48.19) The patient's CHA2DS2-VASc score is 3, indicating a 3.2% annual risk of stroke.   Patient in symptomatic rate controlled afib.  Patient scheduled for ablation 08/27/22. Repeat TEE scheduled for 08/20/22 to evaluate for LAA thrombus. Continue Eliquis 5 mg BID Continue Lopressor 100 mg BID Continue diltiazem 180 mg daily  2. Secondary Hypercoagulable State (ICD10:  D68.69) The patient is at significant risk for stroke/thromboembolism based upon his CHA2DS2-VASc Score of 3.  Continue Apixaban (Eliquis).   3. Obesity Body mass index is 37.54 kg/m. Lifestyle modification was discussed and encouraged including regular physical activity and weight reduction.  4. Obstructive sleep apnea Encouraged compliance with CPAP therapy.  5. CAD No anginal symptoms.  6. HTN Stable, no changes today.   Follow up for ablation as scheduled.    Ricky Jaicob Dia PA-C [facility] [address] [place], [place] [address] [phone] 08/11/2022 9:02 AM 

## 2022-08-11 NOTE — H&P (View-Only) (Signed)
  Primary Care Physician: Street, Christopher M, MD Primary Cardiologist: Dr Kelly Primary Electrophysiologist: Dr Camnitz  Referring Physician: Dr Camnitz   Timothy [name] is a 77 y.o. male with a history of CAD, HLD, HTN, OSA, atrial fibrillation who presents for follow up in the [facility]. He was previously on amiodarone but developed skin toxicity. He had ablation x2 at [facility] by Dr. Fitzgerald in 2009. He has obstructive sleep apnea. June 2023 was found to be in atrial fibrillation. He had a cardioversion 10/05/2021. At that time, he had 3 unsuccessful attempts at cardioversion. Patient is on Eliquis for a CHADS2VASC score of 3. Patient was scheduled for ablation with Dr Camnitz but the pre ablation CT and TEE showed LA thrombus and the surgery was cancelled. His Xarelto was changed to Eliquis. TEE on 04/03/22 showed persistent but smaller thrombus. He is scheduled for ablation on 08/27/22 with another TEE the week prior.  On follow up today, patient remains in rate controlled afib with symptoms of fatigue. He denies any missed doses of anticoagulation.   Today, he denies symptoms of palpitations, chest pain, shortness of breath, orthopnea, PND, lower extremity edema, dizziness, presyncope, syncope, snoring, daytime somnolence, bleeding, or neurologic sequela. The patient is tolerating medications without difficulties and is otherwise without complaint today.    Atrial Fibrillation Risk Factors:  he does have symptoms or diagnosis of sleep apnea. he is compliant with CPAP therapy. he does not have a history of rheumatic fever.   he has a BMI of Body mass index is 37.54 kg/m.. Filed Weights   08/11/22 0831  Weight: 105.5 kg   Family History  Problem Relation Age of Onset   Coronary artery disease Father        MI   Hypertension Father    Diabetes Mother    Hypertension Sister    Atrial fibrillation Child    Hypertension Child       Atrial Fibrillation Management history:  Previous antiarrhythmic drugs: amiodarone  Previous cardioversions: several, most recently 10/05/21 Previous ablations: 2008, 2009 CHADS2VASC score: 3 Anticoagulation history: Xarelto, Eliquis   Past Medical History:  Diagnosis Date   Atrial fibrillation (HCC)    CAD (coronary artery disease)    LAD stent in 1999   Hyperlipidemia    Hypertension    PAF (paroxysmal atrial fibrillation) (HCC)    cardioversion in 2013   SSS (sick sinus syndrome) (HCC)    Past Surgical History:  Procedure Laterality Date   ATRIAL FIBRILLATION ABLATION N/A 02/20/2022   Procedure: ATRIAL FIBRILLATION ABLATION;  Surgeon: Camnitz, Will Martin, MD;  [facility];  Service: Cardiovascular;  Laterality: N/A;   CARDIAC ELECTROPHYSIOLOGY STUDY AND ABLATION  2008, 2009   RF ablation (x4) - [facility] - Dr. Fitzgerald)   CARDIOVERSION  06/15/2011   Procedure: CARDIOVERSION;  Surgeon: Kenneth C. Hilty, MD;  Location: [facility];  Service: Cardiovascular;  Laterality: N/A;   CARDIOVERSION N/A 09/15/2021   Procedure: CARDIOVERSION;  Surgeon: Skains, Mark C, MD;  [facility];  Service: Cardiovascular;  Laterality: N/A;   CORONARY ANGIOPLASTY WITH STENT PLACEMENT  10/29/1997   .0x20 self-expanding radius stent to LAD (Dr. R. Weintraub)   NM MYOCAR PERF WALL MOTION  07/2011   bruce myoview; partially fixed apical defect, worse at rest than stress; fixed inferobasal diaphragmatic attenuation artifact; no reversible ischemia; post-stress EF 60%; short run of NSVT; low risk scan    TEE WITHOUT CARDIOVERSION N/A 04/03/2022     Procedure: TRANSESOPHAGEAL ECHOCARDIOGRAM (TEE);  Surgeon: Acharya, Gayatri A, MD;  [facility];  Service: Cardiology;  Laterality: N/A;   TEE WITHOUT CARDIOVERSION N/A 02/20/2022   Procedure: TRANSESOPHAGEAL ECHOCARDIOGRAM (TEE);  Surgeon: Camnitz, Will Martin, MD;  [facility];  Service: Cardiovascular;   Laterality: N/A;   TRANSTHORACIC ECHOCARDIOGRAM  06/13/2011   EF 55-60%, mod conc LVH; LA mildly dilated    Current Outpatient Medications  Medication Sig Dispense Refill   acetaminophen (TYLENOL) 650 MG CR tablet Take 1,300 mg by mouth as needed for pain.     apixaban (ELIQUIS) 5 MG TABS tablet Take 1 tablet (5 mg total) by mouth 2 (two) times daily. 180 tablet 3   aspirin 81 MG chewable tablet Chew 81 mg by mouth in the morning.     atorvastatin (LIPITOR) 40 MG tablet TAKE ONE TABLET BY MOUTH EVERYDAY AT BEDTIME 90 tablet 3   diltiazem (CARDIZEM CD) 180 MG 24 hr capsule TAKE ONE CAPSULE BY MOUTH ONCE DAILY 90 capsule 3   esomeprazole (NEXIUM) 20 MG capsule Take 20 mg by mouth daily before breakfast.     furosemide (LASIX) 20 MG tablet Take 1 tablet (20 mg total) by mouth daily. 90 tablet 3   GARLIC PO Take 1 capsule by mouth daily.     loratadine (CLARITIN) 10 MG tablet Take 10 mg by mouth as needed for allergies.     metoprolol tartrate (LOPRESSOR) 100 MG tablet Take 1 tablet (100 mg total) by mouth 2 (two) times daily. 180 tablet 3   Omega-3 Fatty Acids (FISH OIL) 1000 MG CAPS Take 1,000 mg by mouth daily.     VITAMIN D PO Take 1 capsule by mouth daily.     No current facility-administered medications for this encounter.    Allergies  Allergen Reactions   Nitroglycerin Other (See Comments)    "Blood Pressure goes to zero"    Social History   Socioeconomic History   Marital status: Married    Spouse name: Not on file   Number of children: 3   Years of education: Not on file   Highest education level: Not on file  Occupational History   Not on file  Tobacco Use   Smoking status: Former    Packs/day: 1    Types: Cigarettes    Quit date: 04/07/1975    Years since quitting: 47.3   Smokeless tobacco: Former   Tobacco comments:    quit smoking 1973. quit smokeless about 30 years ago.  Substance and Sexual Activity   Alcohol use: Yes   Drug use: No   Sexual activity: Not  on file  Other Topics Concern   Not on file  Social History Narrative   Not on file   Social Determinants of Health   Financial Resource Strain: Not on file  Food Insecurity: Not on file  Transportation Needs: Not on file  Physical Activity: Not on file  Stress: Not on file  Social Connections: Not on file  Intimate Partner Violence: Not on file     ROS- All systems are reviewed and negative except as per the HPI above.  Physical Exam: Vitals:   08/11/22 0831  BP: 126/82  Pulse: 88  Weight: 105.5 kg  Height: 5' 6" (1.676 m)     GEN- The patient is a well appearing male, alert and oriented x 3 today.   HEENT-head normocephalic, atraumatic, sclera clear, conjunctiva pink, hearing intact, trachea midline. Lungs- Clear to ausculation bilaterally, normal work   of breathing Heart- irregular rate and rhythm, no murmurs, rubs or gallops  GI- soft, NT, ND, + BS Extremities- no clubbing, cyanosis, or edema MS- no significant deformity or atrophy Skin- no rash or lesion Psych- euthymic mood, full affect Neuro- strength and sensation are intact   Wt Readings from Last 3 Encounters:  08/11/22 105.5 kg  07/06/22 107 kg  06/22/22 107.9 kg    EKG today demonstrates  Afib Vent. rate 88 BPM PR interval * ms QRS duration 106 ms QT/QTcB 388/469 ms   Echo 10/10/21 demonstrated   1. Left ventricular ejection fraction, by estimation, is 45-50% with beat  to beat variability. The left ventricle has mildly decreased function.  Left ventricular endocardial border not optimally defined to evaluate  regional wall motion. There is mild left ventricular hypertrophy. Left ventricular diastolic parameters are indeterminate.   2. Right ventricular systolic function is mildly reduced. The right  ventricular size is normal. There is normal pulmonary artery systolic  pressure. The estimated right ventricular systolic pressure is 19.0 mmHg.   3. Left atrial size was mildly dilated.   4. The  mitral valve is normal in structure. Mild mitral valve  regurgitation. No evidence of mitral stenosis.   5. The aortic valve is grossly normal. There is mild thickening of the  aortic valve. Aortic valve regurgitation is trivial. No aortic stenosis is  present.   6. The inferior vena cava is normal in size with greater than 50%  respiratory variability, suggesting right atrial pressure of 3 mmHg.   Comparison(s): A prior study was performed on 07/17/20. Prior images  reviewed side by side. LV function has decreased compared to prior exam.    Epic records are reviewed at length today  CHA2DS2-VASc Score = 3  The patient's score is based upon: CHF History: 0 HTN History: 1 Diabetes History: 0 Stroke History: 0 Vascular Disease History: 1 Age Score: 1 Gender Score: 0       ASSESSMENT AND PLAN: 1. Persistent Atrial Fibrillation (ICD10:  I48.19) The patient's CHA2DS2-VASc score is 3, indicating a 3.2% annual risk of stroke.   Patient in symptomatic rate controlled afib.  Patient scheduled for ablation 08/27/22. Repeat TEE scheduled for 08/20/22 to evaluate for LAA thrombus. Continue Eliquis 5 mg BID Continue Lopressor 100 mg BID Continue diltiazem 180 mg daily  2. Secondary Hypercoagulable State (ICD10:  D68.69) The patient is at significant risk for stroke/thromboembolism based upon his CHA2DS2-VASc Score of 3.  Continue Apixaban (Eliquis).   3. Obesity Body mass index is 37.54 kg/m. Lifestyle modification was discussed and encouraged including regular physical activity and weight reduction.  4. Obstructive sleep apnea Encouraged compliance with CPAP therapy.  5. CAD No anginal symptoms.  6. HTN Stable, no changes today.   Follow up for ablation as scheduled.    Ricky Sanja Elizardo PA-C [facility] [address] [place], [place] [address] [phone] 08/11/2022 9:02 AM 

## 2022-08-17 ENCOUNTER — Telehealth: Payer: Self-pay | Admitting: Cardiovascular Disease

## 2022-08-17 NOTE — Telephone Encounter (Signed)
What problem are you experiencing?   Unable to contact referred CPAP supply company  Who is your medical equipment company?  [employer]  Please route to the sleep study assistant.    Patient stated his old CPAP supply company closed and the company he was referred to he has been calling and calling and has not got a response.  Patient wants doctors notes and prescription to go to [facility] Patient, Timothy [name] as his CPAP supplies are wearing out and this company would be the same as his wife.

## 2022-08-19 NOTE — Pre-Procedure Instructions (Signed)
Spoke to patient on phone regarding TEE - arrive at 0800, NPO after midnight, confirmed patient has ride home and responsible person for 24 hours, take pills in the AM with sip of water

## 2022-08-20 ENCOUNTER — Ambulatory Visit (HOSPITAL_COMMUNITY): Payer: Medicare Other | Admitting: Anesthesiology

## 2022-08-20 ENCOUNTER — Ambulatory Visit (HOSPITAL_BASED_OUTPATIENT_CLINIC_OR_DEPARTMENT_OTHER)
Admission: RE | Admit: 2022-08-20 | Discharge: 2022-08-20 | Disposition: A | Discharge status: Home / Self Care | Payer: Medicare Other | Source: Ambulatory Visit | Attending: Cardiology | Admitting: Cardiology

## 2022-08-20 ENCOUNTER — Encounter (HOSPITAL_COMMUNITY)
Admission: RE | Disposition: A | Discharge status: Home / Self Care | Payer: Self-pay | Source: Home / Self Care | Attending: Cardiology

## 2022-08-20 ENCOUNTER — Ambulatory Visit (HOSPITAL_COMMUNITY)
Admission: RE | Admit: 2022-08-20 | Discharge: 2022-08-20 | Disposition: A | Discharge status: Home / Self Care | Payer: Medicare Other | Attending: Cardiology | Admitting: Cardiology

## 2022-08-20 ENCOUNTER — Ambulatory Visit (HOSPITAL_BASED_OUTPATIENT_CLINIC_OR_DEPARTMENT_OTHER): Payer: Medicare Other | Admitting: Anesthesiology

## 2022-08-20 ENCOUNTER — Other Ambulatory Visit: Payer: Self-pay

## 2022-08-20 ENCOUNTER — Encounter (HOSPITAL_COMMUNITY): Payer: Self-pay | Admitting: Cardiology

## 2022-08-20 DIAGNOSIS — I4819 Other persistent atrial fibrillation: Secondary | ICD-10-CM

## 2022-08-20 DIAGNOSIS — Z6837 Body mass index (BMI) 37.0-37.9, adult: Secondary | ICD-10-CM | POA: Insufficient documentation

## 2022-08-20 DIAGNOSIS — Z7901 Long term (current) use of anticoagulants: Secondary | ICD-10-CM | POA: Diagnosis not present

## 2022-08-20 DIAGNOSIS — I4891 Unspecified atrial fibrillation: Secondary | ICD-10-CM | POA: Diagnosis not present

## 2022-08-20 DIAGNOSIS — D6869 Other thrombophilia: Secondary | ICD-10-CM | POA: Insufficient documentation

## 2022-08-20 DIAGNOSIS — G4733 Obstructive sleep apnea (adult) (pediatric): Secondary | ICD-10-CM | POA: Insufficient documentation

## 2022-08-20 DIAGNOSIS — I513 Intracardiac thrombosis, not elsewhere classified: Secondary | ICD-10-CM

## 2022-08-20 DIAGNOSIS — I2510 Atherosclerotic heart disease of native coronary artery without angina pectoris: Secondary | ICD-10-CM

## 2022-08-20 DIAGNOSIS — E669 Obesity, unspecified: Secondary | ICD-10-CM | POA: Insufficient documentation

## 2022-08-20 DIAGNOSIS — I340 Nonrheumatic mitral (valve) insufficiency: Secondary | ICD-10-CM | POA: Diagnosis not present

## 2022-08-20 DIAGNOSIS — I10 Essential (primary) hypertension: Secondary | ICD-10-CM

## 2022-08-20 DIAGNOSIS — Z87891 Personal history of nicotine dependence: Secondary | ICD-10-CM | POA: Diagnosis not present

## 2022-08-20 DIAGNOSIS — Z79899 Other long term (current) drug therapy: Secondary | ICD-10-CM | POA: Diagnosis not present

## 2022-08-20 DIAGNOSIS — I088 Other rheumatic multiple valve diseases: Secondary | ICD-10-CM | POA: Diagnosis not present

## 2022-08-20 DIAGNOSIS — Z955 Presence of coronary angioplasty implant and graft: Secondary | ICD-10-CM | POA: Diagnosis not present

## 2022-08-20 HISTORY — PX: TEE WITHOUT CARDIOVERSION: SHX5443

## 2022-08-20 LAB — ECHO TEE

## 2022-08-20 SURGERY — ECHOCARDIOGRAM, TRANSESOPHAGEAL
Anesthesia: Monitor Anesthesia Care

## 2022-08-20 MED ORDER — PROPOFOL 500 MG/50ML IV EMUL
INTRAVENOUS | Status: DC | PRN
  Administered 2022-08-20: 200 ug/kg/min via INTRAVENOUS

## 2022-08-20 MED ORDER — PROPOFOL 10 MG/ML IV BOLUS
INTRAVENOUS | Status: DC | PRN
  Administered 2022-08-20: 50 mg via INTRAVENOUS

## 2022-08-20 MED ORDER — SODIUM CHLORIDE 0.9 % IV SOLN: INTRAVENOUS | Status: DC

## 2022-08-20 NOTE — Discharge Instructions (Signed)

## 2022-08-20 NOTE — Interval H&P Note (Signed)
History and Physical Interval Note:  08/20/2022 8:03 AM  Timothy [name]  has presented today for surgery, with the diagnosis of afib, thrombus.  The various methods of treatment have been discussed with the patient and family. After consideration of risks, benefits and other options for treatment, the patient has consented to  Procedure(s): TRANSESOPHAGEAL ECHOCARDIOGRAM (TEE) (N/A) as a surgical intervention.  The patient's history has been reviewed, patient examined, no change in status, stable for surgery.  I have reviewed the patient's chart and labs.  Questions were answered to the patient's satisfaction.     [employer]

## 2022-08-20 NOTE — Progress Notes (Signed)
  Echocardiogram Echocardiogram Transesophageal has been performed.  Timothy [name] 08/20/2022, 9:18 AM

## 2022-08-20 NOTE — Anesthesia Preprocedure Evaluation (Signed)
Anesthesia Evaluation  Patient identified by MRN, date of birth, ID band Patient awake    Reviewed: Allergy & Precautions, H&P , NPO status , Patient's Chart, lab work & pertinent test results  Airway Mallampati: II   Neck ROM: full    Dental   Pulmonary former smoker   breath sounds clear to auscultation       Cardiovascular hypertension, + CAD and + Cardiac Stents  + dysrhythmias Atrial Fibrillation  Rhythm:irregular Rate:Normal     Neuro/Psych    GI/Hepatic   Endo/Other    Renal/GU      Musculoskeletal   Abdominal   Peds  Hematology   Anesthesia Other Findings   Reproductive/Obstetrics                             Anesthesia Physical Anesthesia Plan  ASA: 3  Anesthesia Plan: MAC   Post-op Pain Management:    Induction: Intravenous  PONV Risk Score and Plan: 1 and Propofol infusion and Treatment may vary due to age or medical condition  Airway Management Planned: Nasal Cannula  Additional Equipment:   Intra-op Plan:   Post-operative Plan:   Informed Consent: I have reviewed the patients History and Physical, chart, labs and discussed the procedure including the risks, benefits and alternatives for the proposed anesthesia with the patient or authorized representative who has indicated his/her understanding and acceptance.     Dental advisory given  Plan Discussed with: CRNA, Anesthesiologist and Surgeon  Anesthesia Plan Comments:        Anesthesia Quick Evaluation

## 2022-08-20 NOTE — CV Procedure (Signed)
   Transesophageal Echocardiogram  Indications: Left atrial appendage thrombus preablation  Time out performed  During this procedure the patient was administered propofol under anesthesiology supervision to achieve and maintain moderate sedation.  The patient's heart rate, blood pressure, and oxygen saturation are monitored continuously during the procedure.   Findings:  Left Ventricle: Ejection fraction 50%  Mitral Valve: Mild mitral regurgitation  Aortic Valve: Trileaflet, trace AI  Tricuspid Valve: Mild tricuspid regurgitation  Left Atrium: Severely dilated, 7.4 cm diameter, no left atrial appendage thrombus  Right Atrium: Normal  Intraatrial septum: Normal  Bubble Contrast Study: Not performed  Impression: There is no evidence of left atrial appendage thrombus as was seen on prior transesophageal echocardiogram and preop CT.  This was confirmed with 3D imaging, multiplanar views  Donato Schultz, MD

## 2022-08-20 NOTE — Transfer of Care (Signed)
Immediate Anesthesia Transfer of Care Note  Patient: Timothy [name]  Procedure(s) Performed: TRANSESOPHAGEAL ECHOCARDIOGRAM (TEE)  Patient Location: Cath Lab  Anesthesia Type:MAC  Level of Consciousness: awake  Airway & Oxygen Therapy: Patient Spontanous Breathing  Post-op Assessment: Report given to RN and Post -op Vital signs reviewed and stable  Post vital signs: Reviewed and stable  Last Vitals:  Vitals Value Taken Time  BP 110/71 08/20/22 0907  Temp 98   Pulse 86 08/20/22 0907  Resp 21 08/20/22 0907  SpO2 93 % 08/20/22 0907  Vitals shown include unvalidated device data.  Last Pain:  Vitals:   08/20/22 0907  TempSrc:   PainSc: Asleep         Complications: No notable events documented.

## 2022-08-21 ENCOUNTER — Encounter (HOSPITAL_COMMUNITY): Payer: Self-pay | Admitting: Cardiology

## 2022-08-24 NOTE — Anesthesia Postprocedure Evaluation (Signed)
Anesthesia Post Note  Patient: Timothy [name]  Procedure(s) Performed: TRANSESOPHAGEAL ECHOCARDIOGRAM (TEE)     Patient location during evaluation: Cath Lab Anesthesia Type: MAC Level of consciousness: awake and alert Pain management: pain level controlled Vital Signs Assessment: post-procedure vital signs reviewed and stable Respiratory status: spontaneous breathing, nonlabored ventilation, respiratory function stable and patient connected to nasal cannula oxygen Cardiovascular status: stable and blood pressure returned to baseline Postop Assessment: no apparent nausea or vomiting Anesthetic complications: no   No notable events documented.  Last Vitals:  Vitals:   08/20/22 0917 08/20/22 0927  BP: 109/74 116/81  Pulse: 83 81  Resp: 20 18  Temp:    SpO2: 96% 97%    Last Pain:  Vitals:   08/20/22 0927  TempSrc:   PainSc: 0-No pain                 Sophiea Ueda S

## 2022-08-25 ENCOUNTER — Telehealth: Payer: Self-pay | Admitting: Cardiovascular Disease

## 2022-08-25 NOTE — Telephone Encounter (Signed)
Patient is requesting call back to discuss getting records and prescriptions to be sent to [facility] Patient for his CPAP. Requesting call back.  RX & Sleep Study Report and notes are what is needed.  Office # [phone] Fax # [phone]

## 2022-08-26 NOTE — Pre-Procedure Instructions (Signed)
Instructed patient on the following items: Arrival time 5:15 Nothing to eat or drink after midnight No meds AM of procedure Responsible person to drive you home and stay with you for 24 hrs  Have you missed any doses of anti-coagulant Eliquis- takes twice a day, hasn't missed any doses.  Don't take dose in the morning.

## 2022-08-27 ENCOUNTER — Ambulatory Visit (HOSPITAL_COMMUNITY): Payer: Medicare Other | Admitting: Certified Registered Nurse Anesthetist

## 2022-08-27 ENCOUNTER — Other Ambulatory Visit: Payer: Self-pay

## 2022-08-27 ENCOUNTER — Encounter (HOSPITAL_COMMUNITY): Payer: Self-pay | Admitting: Cardiology

## 2022-08-27 ENCOUNTER — Ambulatory Visit (HOSPITAL_COMMUNITY)
Admission: RE | Admit: 2022-08-27 | Discharge: 2022-08-27 | Disposition: A | Discharge status: Home / Self Care | Payer: Medicare Other | Attending: Cardiology | Admitting: Cardiology

## 2022-08-27 ENCOUNTER — Ambulatory Visit (HOSPITAL_BASED_OUTPATIENT_CLINIC_OR_DEPARTMENT_OTHER): Payer: Medicare Other | Admitting: Certified Registered Nurse Anesthetist

## 2022-08-27 ENCOUNTER — Encounter (HOSPITAL_COMMUNITY)
Admission: RE | Disposition: A | Discharge status: Home / Self Care | Payer: Self-pay | Source: Home / Self Care | Attending: Cardiology

## 2022-08-27 DIAGNOSIS — D6869 Other thrombophilia: Secondary | ICD-10-CM | POA: Insufficient documentation

## 2022-08-27 DIAGNOSIS — I10 Essential (primary) hypertension: Secondary | ICD-10-CM

## 2022-08-27 DIAGNOSIS — E669 Obesity, unspecified: Secondary | ICD-10-CM | POA: Diagnosis not present

## 2022-08-27 DIAGNOSIS — Z6837 Body mass index (BMI) 37.0-37.9, adult: Secondary | ICD-10-CM | POA: Insufficient documentation

## 2022-08-27 DIAGNOSIS — I4819 Other persistent atrial fibrillation: Secondary | ICD-10-CM | POA: Diagnosis not present

## 2022-08-27 DIAGNOSIS — Z87891 Personal history of nicotine dependence: Secondary | ICD-10-CM

## 2022-08-27 DIAGNOSIS — I2510 Atherosclerotic heart disease of native coronary artery without angina pectoris: Secondary | ICD-10-CM | POA: Diagnosis not present

## 2022-08-27 DIAGNOSIS — G4733 Obstructive sleep apnea (adult) (pediatric): Secondary | ICD-10-CM | POA: Insufficient documentation

## 2022-08-27 DIAGNOSIS — I4891 Unspecified atrial fibrillation: Secondary | ICD-10-CM | POA: Diagnosis not present

## 2022-08-27 HISTORY — PX: ATRIAL FIBRILLATION ABLATION: EP1191

## 2022-08-27 LAB — POCT ACTIVATED CLOTTING TIME: Activated Clotting Time: 269 seconds

## 2022-08-27 SURGERY — ATRIAL FIBRILLATION ABLATION
Anesthesia: General

## 2022-08-27 MED ORDER — PHENYLEPHRINE HCL-NACL 20-0.9 MG/250ML-% IV SOLN
INTRAVENOUS | Status: DC | PRN
  Administered 2022-08-27: 50 ug/min via INTRAVENOUS

## 2022-08-27 MED ORDER — ACETAMINOPHEN 500 MG PO TABS
1000.0000 mg | ORAL_TABLET | Freq: Once | ORAL | Status: AC
  Administered 2022-08-27: 1000 mg via ORAL
  Filled 2022-08-27: qty 2

## 2022-08-27 MED ORDER — ONDANSETRON HCL 4 MG/2ML IJ SOLN
INTRAMUSCULAR | Status: DC | PRN
  Administered 2022-08-27: 4 mg via INTRAVENOUS

## 2022-08-27 MED ORDER — DEXAMETHASONE SODIUM PHOSPHATE 10 MG/ML IJ SOLN
INTRAMUSCULAR | Status: DC | PRN
  Administered 2022-08-27: 5 mg via INTRAVENOUS

## 2022-08-27 MED ORDER — ROCURONIUM BROMIDE 10 MG/ML (PF) SYRINGE
PREFILLED_SYRINGE | INTRAVENOUS | Status: DC | PRN
  Administered 2022-08-27: 60 mg via INTRAVENOUS

## 2022-08-27 MED ORDER — SODIUM CHLORIDE 0.9% FLUSH: 3.0000 mL | INTRAVENOUS | Status: DC | PRN

## 2022-08-27 MED ORDER — ACETAMINOPHEN 325 MG PO TABS: 650.0000 mg | ORAL_TABLET | ORAL | Status: DC | PRN

## 2022-08-27 MED ORDER — FENTANYL CITRATE (PF) 100 MCG/2ML IJ SOLN
INTRAMUSCULAR | Status: DC | PRN
  Administered 2022-08-27: 100 ug via INTRAVENOUS

## 2022-08-27 MED ORDER — SODIUM CHLORIDE 0.9% FLUSH: 3.0000 mL | Freq: Two times a day (BID) | INTRAVENOUS | Status: DC

## 2022-08-27 MED ORDER — SODIUM CHLORIDE 0.9 % IV SOLN: 250.0000 mL | INTRAVENOUS | Status: DC | PRN

## 2022-08-27 MED ORDER — HEPARIN SODIUM (PORCINE) 1000 UNIT/ML IJ SOLN
INTRAMUSCULAR | Status: AC
  Filled 2022-08-27: qty 10

## 2022-08-27 MED ORDER — ONDANSETRON HCL 4 MG/2ML IJ SOLN: 4.0000 mg | Freq: Four times a day (QID) | INTRAMUSCULAR | Status: DC | PRN

## 2022-08-27 MED ORDER — PROPOFOL 10 MG/ML IV BOLUS
INTRAVENOUS | Status: DC | PRN
  Administered 2022-08-27: 160 mg via INTRAVENOUS

## 2022-08-27 MED ORDER — SODIUM CHLORIDE 0.9 % IV SOLN: INTRAVENOUS | Status: DC

## 2022-08-27 MED ORDER — DOBUTAMINE INFUSION FOR EP/ECHO/NUC (1000 MCG/ML)
INTRAVENOUS | Status: DC | PRN
  Administered 2022-08-27: 20 ug/kg/min via INTRAVENOUS

## 2022-08-27 MED ORDER — HEPARIN SODIUM (PORCINE) 1000 UNIT/ML IJ SOLN
INTRAMUSCULAR | Status: DC | PRN
  Administered 2022-08-27: 7000 [IU] via INTRAVENOUS
  Administered 2022-08-27: 15000 [IU] via INTRAVENOUS

## 2022-08-27 MED ORDER — SUGAMMADEX SODIUM 200 MG/2ML IV SOLN
INTRAVENOUS | Status: DC | PRN
  Administered 2022-08-27: 200 mg via INTRAVENOUS

## 2022-08-27 MED ORDER — PROTAMINE SULFATE 10 MG/ML IV SOLN
INTRAVENOUS | Status: DC | PRN
  Administered 2022-08-27: 40 mg via INTRAVENOUS

## 2022-08-27 MED ORDER — FENTANYL CITRATE (PF) 250 MCG/5ML IJ SOLN: INTRAMUSCULAR | Status: DC | PRN

## 2022-08-27 MED ORDER — HEPARIN SODIUM (PORCINE) 1000 UNIT/ML IJ SOLN
INTRAMUSCULAR | Status: DC | PRN
  Administered 2022-08-27: 1000 [IU] via INTRAVENOUS

## 2022-08-27 MED ORDER — LIDOCAINE 2% (20 MG/ML) 5 ML SYRINGE
INTRAMUSCULAR | Status: DC | PRN
  Administered 2022-08-27: 80 mg via INTRAVENOUS

## 2022-08-27 MED ORDER — HEPARIN (PORCINE) IN NACL 1000-0.9 UT/500ML-% IV SOLN
INTRAVENOUS | Status: DC | PRN
  Administered 2022-08-27 (×4): 500 mL

## 2022-08-27 SURGICAL SUPPLY — 23 items
BAG SNAP BAND KOVER 36X36 (MISCELLANEOUS) IMPLANT
BLANKET WARM UNDERBOD FULL ACC (MISCELLANEOUS) ×1 IMPLANT
CATH 8FR REPROCESSED SOUNDSTAR (CATHETERS) ×1 IMPLANT
CATH 8FR SOUNDSTAR REPROCESSED (CATHETERS) IMPLANT
CATH ABLAT QDOT MICRO BI TC DF (CATHETERS) IMPLANT
CATH OCTARAY 2.0 F 3-3-3-3-3 (CATHETERS) IMPLANT
CATH PIGTAIL STEERABLE D1 8.7 (WIRE) IMPLANT
CATH S-M CIRCA TEMP PROBE (CATHETERS) IMPLANT
CATH WEB BI DIR CSDF CRV REPRO (CATHETERS) IMPLANT
CLOSURE MYNX CONTROL 6F/7F (Vascular Products) IMPLANT
CLOSURE PERCLOSE PROSTYLE (VASCULAR PRODUCTS) IMPLANT
COVER SWIFTLINK CONNECTOR (BAG) ×1 IMPLANT
MAT PREVALON FULL STRYKER (MISCELLANEOUS) IMPLANT
PACK EP LATEX FREE (CUSTOM PROCEDURE TRAY) ×1
PACK EP LF (CUSTOM PROCEDURE TRAY) ×1 IMPLANT
PAD DEFIB RADIO PHYSIO CONN (PAD) ×1 IMPLANT
PATCH CARTO3 (PAD) IMPLANT
SHEATH CARTO VIZIGO SM CVD (SHEATH) IMPLANT
SHEATH PINNACLE 7F 10CM (SHEATH) IMPLANT
SHEATH PINNACLE 8F 10CM (SHEATH) IMPLANT
SHEATH PINNACLE 9F 10CM (SHEATH) IMPLANT
SHEATH PROBE COVER 6X72 (BAG) IMPLANT
TUBING SMART ABLATE COOLFLOW (TUBING) IMPLANT

## 2022-08-27 NOTE — Anesthesia Postprocedure Evaluation (Signed)
Anesthesia Post Note  Patient: Timothy [name]  Procedure(s) Performed: ATRIAL FIBRILLATION ABLATION     Patient location during evaluation: PACU Anesthesia Type: General Level of consciousness: awake and alert Pain management: pain level controlled Vital Signs Assessment: post-procedure vital signs reviewed and stable Respiratory status: spontaneous breathing, nonlabored ventilation, respiratory function stable and patient connected to nasal cannula oxygen Cardiovascular status: blood pressure returned to baseline and stable Postop Assessment: no apparent nausea or vomiting Anesthetic complications: yes  Encounter Notable Events  Notable Event Outcome Phase Comment  Difficult to intubate - expected  Intraprocedure Filed from anesthesia note documentation.    Last Vitals:  Vitals:   08/27/22 1130 08/27/22 1200  BP: 128/77 117/75  Pulse: 64 66  Resp: 15 17  Temp:    SpO2: 96% 96%    Last Pain:  Vitals:   08/27/22 1000  TempSrc:   PainSc: 0-No pain                 Kennieth Rad

## 2022-08-27 NOTE — Anesthesia Procedure Notes (Signed)
Procedure Name: Intubation Date/Time: 08/27/2022 7:45 AM  Performed by: Waynard Edwards, CRNAPre-anesthesia Checklist: Patient identified, Emergency Drugs available, Suction available and Patient being monitored Patient Re-evaluated:Patient Re-evaluated prior to induction Oxygen Delivery Method: Circle system utilized Preoxygenation: Pre-oxygenation with 100% oxygen Induction Type: IV induction Ventilation: Mask ventilation with difficulty, Oral airway inserted - appropriate to patient size and Two handed mask ventilation required Laryngoscope Size: Glidescope and 4 Grade View: Grade II Tube type: Oral Tube size: 7.5 mm Number of attempts: 1 Airway Equipment and Method: Oral airway, Rigid stylet and Video-laryngoscopy Placement Confirmation: ETT inserted through vocal cords under direct vision, positive ETCO2 and breath sounds checked- equal and bilateral Secured at: 21 cm Tube secured with: Tape Dental Injury: Teeth and Oropharynx as per pre-operative assessment  Difficulty Due To: Difficulty was anticipated, Difficult Airway- due to large tongue, Difficult Airway- due to reduced neck mobility and Difficult Airway- due to limited oral opening

## 2022-08-27 NOTE — Transfer of Care (Signed)
Immediate Anesthesia Transfer of Care Note  Patient: Timothy [name]  Procedure(s) Performed: ATRIAL FIBRILLATION ABLATION  Patient Location: Cath Lab  Anesthesia Type:General  Level of Consciousness: drowsy and patient cooperative  Airway & Oxygen Therapy: Patient Spontanous Breathing and Patient connected to nasal cannula oxygen  Post-op Assessment: Report given to RN and Post -op Vital signs reviewed and stable  Post vital signs: Reviewed and stable  Last Vitals:  Vitals Value Taken Time  BP 126/79 08/27/22 0920  Temp    Pulse 76 08/27/22 0921  Resp 13 08/27/22 0921  SpO2 97 % 08/27/22 0921  Vitals shown include unvalidated device data.  Last Pain:  Vitals:   08/27/22 0550  TempSrc: Temporal  PainSc: 0-No pain         Complications:  Encounter Notable Events  Notable Event Outcome Phase Comment  Difficult to intubate - expected  Intraprocedure Filed from anesthesia note documentation.

## 2022-08-27 NOTE — Interval H&P Note (Signed)
History and Physical Interval Note:  08/27/2022 7:07 AM  Timothy [name]  has presented today for surgery, with the diagnosis of afib.  The various methods of treatment have been discussed with the patient and family. After consideration of risks, benefits and other options for treatment, the patient has consented to  Procedure(s): ATRIAL FIBRILLATION ABLATION (N/A) as a surgical intervention.  The patient's history has been reviewed, patient examined, no change in status, stable for surgery.  I have reviewed the patient's chart and labs.  Questions were answered to the patient's satisfaction.     Doral Ventrella [employer]

## 2022-08-27 NOTE — Discharge Instructions (Addendum)
Cardiac Ablation, Care After  This sheet gives you information about how to care for yourself after your procedure. Your health care provider may also give you more specific instructions. If you have problems or questions, contact your health care provider. What can I expect after the procedure? After the procedure, it is common to have: Bruising around your puncture site. Tenderness around your puncture site. Skipped heartbeats. If you had an atrial fibrillation ablation, you may have atrial fibrillation during the first several months after your procedure.  Tiredness (fatigue).  Follow these instructions at home: Puncture site care  Follow instructions from your health care provider about how to take care of your puncture site. Make sure you: If present, leave stitches (sutures), skin glue, or adhesive strips in place. These skin closures may need to stay in place for up to 2 weeks. If adhesive strip edges start to loosen and curl up, you may trim the loose edges. Do not remove adhesive strips completely unless your health care provider tells you to do that. If a large square bandage is present, this may be removed 24 hours after surgery.  Check your puncture site every day for signs of infection. Check for: Redness, swelling, or pain. Fluid or blood. If your puncture site starts to bleed, lie down on your back, apply firm pressure to the area, and contact your health care provider. Warmth. Pus or a bad smell. A pea or small marble sized lump at the site is normal and can take up to three months to resolve.  Driving Do not drive for at least 4 days after your procedure or however long your health care provider recommends. (Do not resume driving if you have previously been instructed not to drive for other health reasons.) Do not drive or use heavy machinery while taking prescription pain medicine. Activity Avoid activities that take a lot of effort for at least 7 days after your  procedure. Do not lift anything that is heavier than 5 lb (4.5 kg) for one week.  No sexual activity for 1 week.  Return to your normal activities as told by your health care provider. Ask your health care provider what activities are safe for you. General instructions Take over-the-counter and prescription medicines only as told by your health care provider. Do not use any products that contain nicotine or tobacco, such as cigarettes and e-cigarettes. If you need help quitting, ask your health care provider. You may shower after 24 hours, but Do not take baths, swim, or use a hot tub for 1 week.  Do not drink alcohol for 24 hours after your procedure. Keep all follow-up visits as told by your health care provider. This is important. Contact a health care provider if: You have redness, mild swelling, or pain around your puncture site. You have fluid or blood coming from your puncture site that stops after applying firm pressure to the area. Your puncture site feels warm to the touch. You have pus or a bad smell coming from your puncture site. You have a fever. You have chest pain or discomfort that spreads to your neck, jaw, or arm. You have chest pain that is worse with lying on your back or taking a deep breath. You are sweating a lot. You feel nauseous. You have a fast or irregular heartbeat. You have shortness of breath. You are dizzy or light-headed and feel the need to lie down. You have pain or numbness in the arm or leg closest to your puncture   site. Get help right away if: Your puncture site suddenly swells. Your puncture site is bleeding and the bleeding does not stop after applying firm pressure to the area. These symptoms may represent a serious problem that is an emergency. Do not wait to see if the symptoms will go away. Get medical help right away. Call your local emergency services (911 in the [place] Do not drive yourself to the hospital. Summary After the procedure, it  is normal to have bruising and tenderness at the puncture site in your groin, neck, or forearm. Check your puncture site every day for signs of infection. Get help right away if your puncture site is bleeding and the bleeding does not stop after applying firm pressure to the area. This is a medical emergency. This information is not intended to replace advice given to you by your health care provider. Make sure you discuss any questions you have with your health care provider.   You have an appointment set up with the Atrial Fibrillation Clinic.  Multiple studies have shown that being followed by a dedicated atrial fibrillation clinic in addition to the standard care you receive from your other physicians improves health. We believe that enrollment in the atrial fibrillation clinic will allow us to better care for you.   The phone number to the Atrial Fibrillation Clinic is [phone]. The clinic is staffed Monday through Friday from 8:30am to 5pm.  Directions: The clinic is located in the [place] hospital, 6TH FLOOR Enter the hospital at the MAIN ENTRANCE "A", use North Tower Elevators to the 6th floor.  Registration desk to the right of elevators on 6th floor  If you have any trouble locating the clinic, please don't hesitate to call [phone].   

## 2022-08-27 NOTE — Telephone Encounter (Signed)
Per patient request DME has changed to [facility]:  Upon patient request DME selection is [facility]. Patient understands he will be contacted by [facility] to set up his cpap. Patient understands to call if [facility] does not contact him with new setup in a timely manner. Patient understands they will be called once confirmation has been received from [facility] that they have received their new machine to schedule 10 week follow up appointment. [facility] notified of new cpap order  Please add to airview Patient was grateful for the call and thanked me.

## 2022-08-27 NOTE — Anesthesia Preprocedure Evaluation (Signed)
Anesthesia Evaluation  Patient identified by MRN, date of birth, ID band Patient awake    Reviewed: Allergy & Precautions, NPO status , Patient's Chart, lab work & pertinent test results  Airway Mallampati: II  TM Distance: >3 FB Neck ROM: Full    Dental   Pulmonary former smoker   breath sounds clear to auscultation       Cardiovascular hypertension, Pt. on medications and Pt. on home beta blockers + CAD  + dysrhythmias Atrial Fibrillation  Rhythm:Regular Rate:Normal     Neuro/Psych negative neurological ROS     GI/Hepatic negative GI ROS, Neg liver ROS,,,  Endo/Other  negative endocrine ROS    Renal/GU negative Renal ROS     Musculoskeletal   Abdominal   Peds  Hematology negative hematology ROS (+)   Anesthesia Other Findings   Reproductive/Obstetrics                             Anesthesia Physical Anesthesia Plan  ASA: 3  Anesthesia Plan: General   Post-op Pain Management: Tylenol PO (pre-op)*   Induction: Intravenous  PONV Risk Score and Plan: 2 and Dexamethasone, Ondansetron and Treatment may vary due to age or medical condition  Airway Management Planned: Oral ETT  Additional Equipment: None  Intra-op Plan:   Post-operative Plan: Extubation in OR  Informed Consent: I have reviewed the patients History and Physical, chart, labs and discussed the procedure including the risks, benefits and alternatives for the proposed anesthesia with the patient or authorized representative who has indicated his/her understanding and acceptance.     Dental advisory given  Plan Discussed with: CRNA  Anesthesia Plan Comments:        Anesthesia Quick Evaluation

## 2022-08-28 ENCOUNTER — Other Ambulatory Visit: Payer: Self-pay | Admitting: Physician Assistant

## 2022-08-28 ENCOUNTER — Encounter (HOSPITAL_COMMUNITY): Payer: Self-pay | Admitting: Cardiology

## 2022-09-15 NOTE — Telephone Encounter (Signed)
Rx and Sleep documents sent to [facility] 09/14/22

## 2022-09-25 ENCOUNTER — Ambulatory Visit (HOSPITAL_COMMUNITY)
Admission: RE | Admit: 2022-09-25 | Discharge: 2022-09-25 | Disposition: A | Discharge status: Home / Self Care | Payer: Medicare Other | Source: Ambulatory Visit | Attending: Physician Assistant | Admitting: Physician Assistant

## 2022-09-25 VITALS — BP 122/80 | HR 85 | Ht 66.0 in | Wt 234.2 lb

## 2022-09-25 DIAGNOSIS — D6869 Other thrombophilia: Secondary | ICD-10-CM | POA: Diagnosis not present

## 2022-09-25 DIAGNOSIS — Z7901 Long term (current) use of anticoagulants: Secondary | ICD-10-CM | POA: Insufficient documentation

## 2022-09-25 DIAGNOSIS — I4891 Unspecified atrial fibrillation: Secondary | ICD-10-CM | POA: Diagnosis not present

## 2022-09-25 DIAGNOSIS — I2510 Atherosclerotic heart disease of native coronary artery without angina pectoris: Secondary | ICD-10-CM | POA: Insufficient documentation

## 2022-09-25 DIAGNOSIS — G4733 Obstructive sleep apnea (adult) (pediatric): Secondary | ICD-10-CM | POA: Diagnosis not present

## 2022-09-25 DIAGNOSIS — I4819 Other persistent atrial fibrillation: Secondary | ICD-10-CM

## 2022-09-25 DIAGNOSIS — I10 Essential (primary) hypertension: Secondary | ICD-10-CM | POA: Insufficient documentation

## 2022-09-25 DIAGNOSIS — Z955 Presence of coronary angioplasty implant and graft: Secondary | ICD-10-CM | POA: Diagnosis not present

## 2022-09-25 DIAGNOSIS — Z6837 Body mass index (BMI) 37.0-37.9, adult: Secondary | ICD-10-CM | POA: Insufficient documentation

## 2022-09-25 DIAGNOSIS — E669 Obesity, unspecified: Secondary | ICD-10-CM | POA: Diagnosis not present

## 2022-09-25 NOTE — Progress Notes (Signed)
Primary Care Physician: Street, Stephanie Coup, MD Primary Cardiologist: Dr Tresa Endo Primary Electrophysiologist: Dr Elberta Fortis  Referring Physician: Dr Lucky Rathke is a 77 y.o. male with a history of CAD, HLD, HTN, OSA, atrial fibrillation who presents for follow up in the [facility]. He was previously on amiodarone but developed skin toxicity. He had ablation x2 at [facility] by Dr. Sampson Goon in 2009. He has obstructive sleep apnea. June 2023 was found to be in atrial fibrillation. He had a cardioversion 10/05/2021. At that time, he had 3 unsuccessful attempts at cardioversion. Patient is on Eliquis for a CHADS2VASC score of 3. Patient was scheduled for ablation with Dr Elberta Fortis but the pre ablation CT and TEE showed LA thrombus and the surgery was cancelled. His Xarelto was changed to Eliquis. TEE on 04/03/22 showed persistent but smaller thrombus.   On follow up today, TEE 6/6 showed no thrombus, patient is now s/p afib ablation with Dr Elberta Fortis on 08/27/22. He reports that he has felt improved overall since the ablation with more energy. He does still notice paroxysms of afib with an elevated heart rate and fatigue but these are not frequent. He is in afib this AM. He denies chest pain, swallowing pain, or groin issues.   Today, he denies symptoms of chest pain, shortness of breath, orthopnea, PND, lower extremity edema, dizziness, presyncope, syncope, snoring, daytime somnolence, bleeding, or neurologic sequela. The patient is tolerating medications without difficulties and is otherwise without complaint today.    Atrial Fibrillation Risk Factors:  he does have symptoms or diagnosis of sleep apnea. he is compliant with CPAP therapy. he does not have a history of rheumatic fever.   Atrial Fibrillation Management history:  Previous antiarrhythmic drugs: amiodarone  Previous cardioversions: several, most recently 10/05/21 Previous ablations: 2008,  2009, 08/27/22 Anticoagulation history: Xarelto, Eliquis   Past Medical History:  Diagnosis Date   Atrial fibrillation (HCC)    CAD (coronary artery disease)    LAD stent in 1999   Hyperlipidemia    Hypertension    PAF (paroxysmal atrial fibrillation) (HCC)    cardioversion in 2013   SSS (sick sinus syndrome) (HCC)     ROS- All systems are reviewed and negative except as per the HPI above.  Physical Exam: Vitals:   09/25/22 0902  BP: 122/80  Pulse: 85  Weight: 106.2 kg  Height: 5\' 6"  (1.676 m)    GEN: Well nourished, well developed in no acute distress NECK: No JVD; No carotid bruits CARDIAC: Irregularly irregular rate and rhythm, no murmurs, rubs, gallops RESPIRATORY:  Clear to auscultation without rales, wheezing or rhonchi  ABDOMEN: Soft, non-tender, non-distended EXTREMITIES:  No edema; No deformity    Wt Readings from Last 3 Encounters:  09/25/22 106.2 kg  08/27/22 104.3 kg  08/20/22 104.3 kg    EKG today demonstrates  Afib, PVCs, slow R wave prog Vent. rate 85 BPM PR interval * ms QRS duration 106 ms QT/QTcB 402/478 ms   Echo 10/10/21 demonstrated   1. Left ventricular ejection fraction, by estimation, is 45-50% with beat  to beat variability. The left ventricle has mildly decreased function.  Left ventricular endocardial border not optimally defined to evaluate  regional wall motion. There is mild left ventricular hypertrophy. Left ventricular diastolic parameters are indeterminate.   2. Right ventricular systolic function is mildly reduced. The right  ventricular size is normal. There is normal pulmonary artery systolic  pressure. The estimated right ventricular systolic  pressure is 19.0 mmHg.   3. Left atrial size was mildly dilated.   4. The mitral valve is normal in structure. Mild mitral valve  regurgitation. No evidence of mitral stenosis.   5. The aortic valve is grossly normal. There is mild thickening of the  aortic valve. Aortic valve  regurgitation is trivial. No aortic stenosis is  present.   6. The inferior vena cava is normal in size with greater than 50%  respiratory variability, suggesting right atrial pressure of 3 mmHg.   Comparison(s): A prior study was performed on 07/17/20. Prior images  reviewed side by side. LV function has decreased compared to prior exam.    Epic records are reviewed at length today  CHA2DS2-VASc Score = 3  The patient's score is based upon: CHF History: 0 HTN History: 1 Diabetes History: 0 Stroke History: 0 Vascular Disease History: 1 Age Score: 1 Gender Score: 0       ASSESSMENT AND PLAN: Persistent Atrial Fibrillation (ICD10:  I48.19) The patient's CHA2DS2-VASc score is 3, indicating a 3.2% annual risk of stroke.   S/p afib ablation 08/27/22 Patient in afib today but overall has been maintaining SR the majority of the time based on his symptoms and pulse checks. Encouraged him to call our clinic if he becomes persistent.  Continue Eliquis 5 mg BID with no missed doses for 3 months post ablation. Continue Lopressor 100 mg BID Continue diltiazem 180 mg daily  Secondary Hypercoagulable State (ICD10:  D68.69) The patient is at significant risk for stroke/thromboembolism based upon his CHA2DS2-VASc Score of 3.  Continue Apixaban (Eliquis).   Obesity Body mass index is 37.8 kg/m.  Encouraged lifestyle modification  OSA  Encouraged nightly CPAP  CAD No anginal symptoms  HTN Stable on current regimen   Follow up with Dr Elberta Fortis as scheduled. Sooner in AF clinic if he becomes persistent and DCCV is required.    Jorja Loa PA-C Afib [facility] [address] [place], [place] [address] [phone] 09/25/2022 9:13 AM

## 2022-10-21 ENCOUNTER — Other Ambulatory Visit: Payer: Self-pay | Admitting: Cardiovascular Disease

## 2022-11-13 ENCOUNTER — Telehealth: Payer: Self-pay | Admitting: Cardiology

## 2022-11-13 DIAGNOSIS — I4891 Unspecified atrial fibrillation: Secondary | ICD-10-CM

## 2022-11-13 MED ORDER — APIXABAN 5 MG PO TABS
5.0000 mg | ORAL_TABLET | Freq: Two times a day (BID) | ORAL | 1 refills | Status: DC
Start: 2022-11-13 — End: 2023-02-18

## 2022-11-13 NOTE — Telephone Encounter (Signed)
*  STAT* If patient is at the pharmacy, call can be transferred to refill team.   1. Which medications need to be refilled? (please list name of each medication and dose if known)   apixaban (ELIQUIS) 5 MG TABS tablet     2. Would you like to learn more about the convenience, safety, & potential cost savings by using the [facility]? No   3. Are you open to using the [facility] (Type [facility]. ) No   4. Which pharmacy/location (including street and city if local pharmacy) is medication to be sent to?  [facility] - [address] - [place], [place] - [address]     5. Do they need a 30 day or 90 day supply? 90 day

## 2022-11-13 NOTE — Telephone Encounter (Signed)
Eliquis 5mg  refill request received. Patient is 77 years old, weight-106.2kg, Crea-0.97 on 08/11/22, Diagnosis-Afib, and last seen by Alphonzo Severance on 09/25/22. Dose is appropriate based on dosing criteria. Will send in refill to requested pharmacy.

## 2022-12-01 ENCOUNTER — Ambulatory Visit: Payer: Medicare Other | Admitting: Cardiology

## 2022-12-31 ENCOUNTER — Encounter: Payer: Self-pay | Admitting: Student

## 2022-12-31 ENCOUNTER — Ambulatory Visit: Payer: Medicare Other | Attending: Cardiology | Admitting: Student

## 2022-12-31 VITALS — BP 110/72 | HR 96 | Wt 238.2 lb

## 2022-12-31 DIAGNOSIS — D6869 Other thrombophilia: Secondary | ICD-10-CM | POA: Diagnosis not present

## 2022-12-31 DIAGNOSIS — I2510 Atherosclerotic heart disease of native coronary artery without angina pectoris: Secondary | ICD-10-CM | POA: Insufficient documentation

## 2022-12-31 DIAGNOSIS — I10 Essential (primary) hypertension: Secondary | ICD-10-CM | POA: Diagnosis not present

## 2022-12-31 DIAGNOSIS — G4733 Obstructive sleep apnea (adult) (pediatric): Secondary | ICD-10-CM | POA: Diagnosis not present

## 2022-12-31 DIAGNOSIS — I4819 Other persistent atrial fibrillation: Secondary | ICD-10-CM | POA: Diagnosis not present

## 2022-12-31 DIAGNOSIS — I4891 Unspecified atrial fibrillation: Secondary | ICD-10-CM | POA: Insufficient documentation

## 2022-12-31 NOTE — Progress Notes (Signed)
Electrophysiology Office Note:   Date:  12/31/2022  ID:  Timothy [name], DOB 15-Jan-1946, MRN [id number]  Primary Cardiologist: Nicki Guadalajara, MD Electrophysiologist: Will Jorja Loa, MD      History of Present Illness:   Timothy [name] is a 77 y.o. male with h/o CAD, HLD, HTN, OSA, and PAF seen today for routine electrophysiology followup.   Since last being seen in our clinic the patient reports doing OK. .Now that he is further out from ablation, he feels like he has remained out of rhythm the majority of the time. He is fairly active, chops wood. Does have some fatigue and SOB along with that but gets by OK. He denies chest pain. Denies bleeding on Eliquis. Hasn't missed doses.   He is not sure if he has been on Tikosyn.   He tolerated amiodarone for > 10 years, he had medication managed thyroid dysfunction and possible skin photosensitivity, but worked outdoors in [employer] at the time.   Review of systems complete and found to be negative unless listed in HPI.   EP Information / Studies Reviewed:    EKG is ordered today. Personal review as below.  EKG Interpretation Date/Time:  Thursday December 31 2022 09:28:40 EDT Ventricular Rate:  96 PR Interval:    QRS Duration:  104 QT Interval:  376 QTC Calculation: 475 R Axis:   59  Text Interpretation: Atrial fibrillation with premature ventricular or aberrantly conducted complexes Nonspecific T wave abnormality Confirmed by Maxine Glenn [phone]) on 12/31/2022 10:29:14 AM    Arrhythmia History  S/p ablation x 2 by Dr. Sampson Goon back in 2009 Ablation 08/27/2022 by Dr. Elberta Fortis  Physical Exam:   VS:  BP 110/72   Pulse 96   Wt 238 lb 3.2 oz (108 kg)   SpO2 98%   BMI 38.45 kg/m    Wt Readings from Last 3 Encounters:  12/31/22 238 lb 3.2 oz (108 kg)  09/25/22 234 lb 3.2 oz (106.2 kg)  08/27/22 230 lb (104.3 kg)     GEN: Well nourished, well developed in no acute distress NECK: No JVD; No carotid bruits CARDIAC:  Irregularly irregular rate and rhythm, no murmurs, rubs, gallops RESPIRATORY:  Clear to auscultation without rales, wheezing or rhonchi  ABDOMEN: Soft, non-tender, non-distended EXTREMITIES:  No edema; No deformity   ASSESSMENT AND PLAN:    Peristent atrial fibrillation S/p redo ablation 08/27/2022, initially had 2 ablations ~2009 EKG today shows AF with PVCs Continue eliquis 5 mg BID for CHA2DS2VASc  of at least 3 Continue lopressor 100 mg BID Continue diltiazem 180 mg daily CrCl is >60, so would qualify for tikosyn.  Reviewed ECG with Dr. Elberta Fortis and we think sinus QTc is closer to ~460. So would consider tikosyn, with low threshold to switch to amiodarone if QT prolongs significantly.   In regards to amiodarone, he denies "skin toxicity" or "hyper-pigmentation". He states he did work in [employer] at the time and did have some hyper-sensitivity but it was easily managed. He did not thyroid dysfunction requiring meds that were stopped once amiodarone was.    Secondary hypercoagulable state Pt on Eliquis as above  Obesity Body mass index is 38.45 kg/m.  Encouraged lifestyle modification   OSA  Encouraged nightly CPAP  CAD No anginal symptoms  HTN Stable on current regimen    Follow up with Afib Clinic in 1-2 week to discuss Tikosyn admission (Confirmed with Dr. Elberta Fortis he is a candidate to start Tikosyn, though with caveat that we may have  to transition to amiodarone if QT prolongs)  Signed, Graciella Freer, PA-C

## 2022-12-31 NOTE — Patient Instructions (Signed)
Medication Instructions:  Your physician recommends that you continue on your current medications as directed. Please refer to the Current Medication list given to you today.  *If you need a refill on your cardiac medications before your next appointment, please call your pharmacy*  Lab Work: CMET, CBC, TSH, FreeT4-TODAY If you have labs (blood work) drawn today and your tests are completely normal, you will receive your results only by: MyChart Message (if you have MyChart) OR A paper copy in the mail If you have any lab test that is abnormal or we need to change your treatment, we will call you to review the results.  Follow-Up: At [facility], you and your health needs are our priority.  As part of our continuing mission to provide you with exceptional heart care, we have created designated Provider Care Teams.  These Care Teams include your primary Cardiologist (physician) and Advanced Practice Providers (APPs -  Physician Assistants and Nurse Practitioners) who all work together to provide you with the care you need, when you need it.  Your next appointment:   1-2 week(s)  Provider:   You will follow up in the Atrial Fibrillation Clinic located at [facility]. Your provider will be: Clint R. Fenton, PA-C or Lake Bells, PA-C

## 2023-01-01 LAB — COMPREHENSIVE METABOLIC PANEL
ALT: 15 [IU]/L (ref 0–44)
AST: 22 [IU]/L (ref 0–40)
Albumin: 4.3 g/dL (ref 3.8–4.8)
Alkaline Phosphatase: 48 [IU]/L (ref 44–121)
BUN/Creatinine Ratio: 16 (ref 10–24)
BUN: 15 mg/dL (ref 8–27)
Bilirubin Total: 0.5 mg/dL (ref 0.0–1.2)
CO2: 24 mmol/L (ref 20–29)
Calcium: 9.6 mg/dL (ref 8.6–10.2)
Chloride: 98 mmol/L (ref 96–106)
Creatinine, Ser: 0.95 mg/dL (ref 0.76–1.27)
Globulin, Total: 1.9 g/dL (ref 1.5–4.5)
Glucose: 79 mg/dL (ref 70–99)
Potassium: 4.9 mmol/L (ref 3.5–5.2)
Sodium: 136 mmol/L (ref 134–144)
Total Protein: 6.2 g/dL (ref 6.0–8.5)
eGFR: 83 mL/min/{1.73_m2} (ref >59)

## 2023-01-01 LAB — CBC
Hematocrit: 38.3 % (ref 37.5–51.0)
Hemoglobin: 12.4 g/dL — ABNORMAL LOW (ref 13.0–17.7)
MCH: 32.2 pg (ref 26.6–33.0)
MCHC: 32.4 g/dL (ref 31.5–35.7)
MCV: 100 fL — ABNORMAL HIGH (ref 79–97)
Platelets: 222 10*3/uL (ref 150–450)
RBC: 3.85 x10E6/uL — ABNORMAL LOW (ref 4.14–5.80)
RDW: 13.2 % (ref 11.6–15.4)
WBC: 5.7 10*3/uL (ref 3.4–10.8)

## 2023-01-01 LAB — T4, FREE: Free T4: 1.07 ng/dL (ref 0.82–1.77)

## 2023-01-01 LAB — TSH: TSH: 4.74 u[IU]/mL — ABNORMAL HIGH (ref 0.450–4.500)

## 2023-01-14 ENCOUNTER — Encounter (HOSPITAL_COMMUNITY): Payer: Self-pay | Admitting: Physician Assistant

## 2023-01-14 ENCOUNTER — Telehealth: Payer: Self-pay | Admitting: Pharmacist

## 2023-01-14 ENCOUNTER — Ambulatory Visit (HOSPITAL_COMMUNITY)
Admission: RE | Admit: 2023-01-14 | Discharge: 2023-01-14 | Disposition: A | Discharge status: Home / Self Care | Payer: Medicare Other | Source: Ambulatory Visit | Attending: Physician Assistant | Admitting: Physician Assistant

## 2023-01-14 ENCOUNTER — Other Ambulatory Visit (HOSPITAL_COMMUNITY): Payer: Self-pay

## 2023-01-14 VITALS — BP 122/70 | HR 94 | Ht 66.0 in | Wt 238.2 lb

## 2023-01-14 DIAGNOSIS — I2510 Atherosclerotic heart disease of native coronary artery without angina pectoris: Secondary | ICD-10-CM | POA: Insufficient documentation

## 2023-01-14 DIAGNOSIS — R5383 Other fatigue: Secondary | ICD-10-CM | POA: Insufficient documentation

## 2023-01-14 DIAGNOSIS — R0602 Shortness of breath: Secondary | ICD-10-CM | POA: Insufficient documentation

## 2023-01-14 DIAGNOSIS — I4819 Other persistent atrial fibrillation: Secondary | ICD-10-CM | POA: Diagnosis not present

## 2023-01-14 DIAGNOSIS — Z6838 Body mass index (BMI) 38.0-38.9, adult: Secondary | ICD-10-CM | POA: Insufficient documentation

## 2023-01-14 DIAGNOSIS — E669 Obesity, unspecified: Secondary | ICD-10-CM | POA: Diagnosis not present

## 2023-01-14 DIAGNOSIS — I10 Essential (primary) hypertension: Secondary | ICD-10-CM | POA: Insufficient documentation

## 2023-01-14 DIAGNOSIS — Z7901 Long term (current) use of anticoagulants: Secondary | ICD-10-CM | POA: Insufficient documentation

## 2023-01-14 DIAGNOSIS — Z79899 Other long term (current) drug therapy: Secondary | ICD-10-CM | POA: Diagnosis not present

## 2023-01-14 DIAGNOSIS — D6869 Other thrombophilia: Secondary | ICD-10-CM | POA: Insufficient documentation

## 2023-01-14 DIAGNOSIS — G4733 Obstructive sleep apnea (adult) (pediatric): Secondary | ICD-10-CM | POA: Insufficient documentation

## 2023-01-14 DIAGNOSIS — E785 Hyperlipidemia, unspecified: Secondary | ICD-10-CM | POA: Insufficient documentation

## 2023-01-14 LAB — MAGNESIUM: Magnesium: 2 mg/dL (ref 1.7–2.4)

## 2023-01-14 NOTE — Telephone Encounter (Signed)
Medication list reviewed in anticipation of upcoming Tikosyn initiation. Patient is not taking any contraindicated or QTc prolonging medications.   Concurrent use of DOFETILIDE and furosemide may result in an increased risk of cardiotoxicity (QT prolongation, torsades de pointes, cardiac arrest). Recommend monitoring during admission.  Patient is anticoagulated on Eliquis on the appropriate dose. Please ensure that patient has not missed any anticoagulation doses in the 3 weeks prior to Tikosyn initiation.   Patient will need to be counseled to avoid use of Benadryl while on Tikosyn and in the 2-3 days prior to Tikosyn initiation.

## 2023-01-14 NOTE — Telephone Encounter (Signed)
-----   Message from Nurse Stacy C sent at 01/14/2023 12:10 PM EDT ----- Regarding: tikosyn Pt for tikosyn please review meds thanks stacy

## 2023-01-14 NOTE — Progress Notes (Signed)
Primary Care Physician: Street, Stephanie Coup, MD Primary Cardiologist: Dr Tresa Endo Primary Electrophysiologist: Dr Elberta Fortis  Referring Physician: Dr Lucky Rathke is a 77 y.o. male with a history of CAD, HLD, HTN, OSA, atrial fibrillation who presents for follow up in the [facility] Atrial Fibrillation Clinic. He was previously on amiodarone but developed skin toxicity. He had ablation x2 at [facility] by Dr. Sampson Goon in 2009. He has obstructive sleep apnea. June 2023 was found to be in atrial fibrillation. He had a cardioversion 10/05/2021. At that time, he had 3 unsuccessful attempts at cardioversion. Patient is on Eliquis for a CHADS2VASC score of 3. Patient was scheduled for ablation with Dr Elberta Fortis but the pre ablation CT and TEE showed LA thrombus and the surgery was cancelled. His Xarelto was changed to Eliquis. TEE on 04/03/22 showed persistent but smaller thrombus.   Patient is now s/p afib ablation with Dr Elberta Fortis on 08/27/22.   On follow up today, patient reports that he continues to have fatigue and SOB with exertion. He remains in afib. No bleeding issues on anticoagulation.   Today, he denies symptoms of palpitations, chest pain, orthopnea, PND, lower extremity edema, dizziness, presyncope, syncope, snoring, daytime somnolence, bleeding, or neurologic sequela. The patient is tolerating medications without difficulties and is otherwise without complaint today.    Atrial Fibrillation Risk Factors:  he does have symptoms or diagnosis of sleep apnea. he is compliant with CPAP therapy. he does not have a history of rheumatic fever.   Atrial Fibrillation Management history:  Previous antiarrhythmic drugs: amiodarone  Previous cardioversions: several, most recently 10/05/21 Previous ablations: 2008, 2009, 08/27/22 Anticoagulation history: Xarelto, Eliquis   Past Medical History:  Diagnosis Date   Atrial fibrillation (HCC)    CAD (coronary artery disease)     LAD stent in 1999   Hyperlipidemia    Hypertension    PAF (paroxysmal atrial fibrillation) (HCC)    cardioversion in 2013   SSS (sick sinus syndrome) (HCC)     ROS- All systems are reviewed and negative except as per the HPI above.  Physical Exam: Vitals:   01/14/23 1128  BP: 122/70  Pulse: 94  Weight: 108 kg  Height: 5\' 6"  (1.676 m)    GEN: Well nourished, well developed in no acute distress NECK: No JVD; No carotid bruits CARDIAC: Irregularly irregular rate and rhythm, no murmurs, rubs, gallops RESPIRATORY:  Clear to auscultation without rales, wheezing or rhonchi  ABDOMEN: Soft, non-tender, non-distended EXTREMITIES:  No edema; No deformity    Wt Readings from Last 3 Encounters:  01/14/23 108 kg  12/31/22 108 kg  09/25/22 106.2 kg    EKG today demonstrates  Afib Vent. rate 94 BPM PR interval * ms QRS duration 106 ms QT/QTcB 376/470 ms   Echo 10/10/21 demonstrated   1. Left ventricular ejection fraction, by estimation, is 45-50% with beat  to beat variability. The left ventricle has mildly decreased function.  Left ventricular endocardial border not optimally defined to evaluate  regional wall motion. There is mild left ventricular hypertrophy. Left ventricular diastolic parameters are indeterminate.   2. Right ventricular systolic function is mildly reduced. The right  ventricular size is normal. There is normal pulmonary artery systolic  pressure. The estimated right ventricular systolic pressure is 19.0 mmHg.   3. Left atrial size was mildly dilated.   4. The mitral valve is normal in structure. Mild mitral valve  regurgitation. No evidence of mitral stenosis.   5. The  aortic valve is grossly normal. There is mild thickening of the  aortic valve. Aortic valve regurgitation is trivial. No aortic stenosis is  present.   6. The inferior vena cava is normal in size with greater than 50%  respiratory variability, suggesting right atrial pressure of 3 mmHg.    Comparison(s): A prior study was performed on 07/17/20. Prior images  reviewed side by side. LV function has decreased compared to prior exam.    Epic records are reviewed at length today  CHA2DS2-VASc Score = 3  The patient's score is based upon: CHF History: 0 HTN History: 1 Diabetes History: 0 Stroke History: 0 Vascular Disease History: 1 Age Score: 1 Gender Score: 0       ASSESSMENT AND PLAN: Persistent Atrial Fibrillation (ICD10:  I48.19) The patient's CHA2DS2-VASc score is 3, indicating a 3.2% annual risk of stroke.   S/p afib ablation 08/27/22 We discussed rhythm control options today including dofetilide and amiodarone. He would prefer to avoid amiodarone given possible off target effects.  Patient would like to pursue dofetilide admission. Continue Eliquis 5 mg BID, states no missed doses in the last 3 weeks. No recent benadryl use PharmD to screen medications Check magnesium today Continue Lopressor 100 mg BID Continue diltiazem 180 mg daily  Secondary Hypercoagulable State (ICD10:  D68.69) The patient is at significant risk for stroke/thromboembolism based upon his CHA2DS2-VASc Score of 3.  Continue Apixaban (Eliquis).   Obesity Body mass index is 38.45 kg/m.  Encouraged lifestyle modification  OSA  Encouraged nightly CPAP  CAD No anginal symptoms  HTN Stable on current regimen   Follow up in the AF clinic for dofetilide loading.    Jorja Loa PA-C Afib Clinic [facility] [address] [place], [place] [address] [phone] 01/14/2023 11:43 AM

## 2023-01-14 NOTE — Telephone Encounter (Signed)
Patient Product/process development scientist completed.    The patient is insured through [facility]. Patient has Medicare and is not eligible for a copay card, but may be able to apply for patient assistance, if available.    Ran test claim for dofetilide (Tikosyn) 500 mcg and the current 30 day co-pay is $5.77.   This test claim was processed through [facility]- copay amounts may vary at other pharmacies due to pharmacy/plan contracts, or as the patient moves through the different stages of their insurance plan.     Roland Earl, CPHT Pharmacy Technician III Certified Patient Advocate [facility] Pharmacy Patient Advocate Team Direct Number: [phone]  Fax: [phone]

## 2023-01-15 ENCOUNTER — Encounter (HOSPITAL_COMMUNITY): Payer: Self-pay

## 2023-01-19 ENCOUNTER — Ambulatory Visit (HOSPITAL_COMMUNITY)
Admission: RE | Admit: 2023-01-19 | Discharge: 2023-01-19 | Disposition: A | Discharge status: Home / Self Care | Payer: Medicare Other | Source: Ambulatory Visit | Attending: Physician Assistant | Admitting: Physician Assistant

## 2023-01-19 ENCOUNTER — Other Ambulatory Visit: Payer: Self-pay

## 2023-01-19 ENCOUNTER — Encounter (HOSPITAL_COMMUNITY): Payer: Self-pay | Admitting: Cardiovascular Disease

## 2023-01-19 ENCOUNTER — Inpatient Hospital Stay (HOSPITAL_COMMUNITY)
Admission: AD | Admit: 2023-01-19 | Discharge: 2023-01-22 | DRG: 309 | Disposition: A | Discharge status: Home / Self Care | Payer: Medicare Other | Source: Ambulatory Visit | Attending: Cardiology | Admitting: Cardiology

## 2023-01-19 VITALS — BP 140/82 | HR 90 | Ht 66.0 in | Wt 237.0 lb

## 2023-01-19 DIAGNOSIS — Z6838 Body mass index (BMI) 38.0-38.9, adult: Secondary | ICD-10-CM | POA: Insufficient documentation

## 2023-01-19 DIAGNOSIS — Z888 Allergy status to other drugs, medicaments and biological substances status: Secondary | ICD-10-CM | POA: Diagnosis not present

## 2023-01-19 DIAGNOSIS — D6869 Other thrombophilia: Secondary | ICD-10-CM | POA: Diagnosis not present

## 2023-01-19 DIAGNOSIS — I2510 Atherosclerotic heart disease of native coronary artery without angina pectoris: Secondary | ICD-10-CM | POA: Diagnosis not present

## 2023-01-19 DIAGNOSIS — Z7901 Long term (current) use of anticoagulants: Secondary | ICD-10-CM

## 2023-01-19 DIAGNOSIS — I4819 Other persistent atrial fibrillation: Secondary | ICD-10-CM | POA: Diagnosis not present

## 2023-01-19 DIAGNOSIS — I10 Essential (primary) hypertension: Secondary | ICD-10-CM | POA: Diagnosis present

## 2023-01-19 DIAGNOSIS — Z955 Presence of coronary angioplasty implant and graft: Secondary | ICD-10-CM

## 2023-01-19 DIAGNOSIS — E785 Hyperlipidemia, unspecified: Secondary | ICD-10-CM | POA: Diagnosis present

## 2023-01-19 DIAGNOSIS — G4733 Obstructive sleep apnea (adult) (pediatric): Secondary | ICD-10-CM | POA: Diagnosis present

## 2023-01-19 DIAGNOSIS — I4891 Unspecified atrial fibrillation: Secondary | ICD-10-CM | POA: Diagnosis not present

## 2023-01-19 DIAGNOSIS — E669 Obesity, unspecified: Secondary | ICD-10-CM | POA: Diagnosis present

## 2023-01-19 DIAGNOSIS — E876 Hypokalemia: Secondary | ICD-10-CM | POA: Diagnosis present

## 2023-01-19 DIAGNOSIS — Z79899 Other long term (current) drug therapy: Secondary | ICD-10-CM | POA: Diagnosis not present

## 2023-01-19 LAB — BASIC METABOLIC PANEL
Anion gap: 8 (ref 5–15)
Anion gap: 7 (ref 5–15)
BUN: 15 mg/dL (ref 8–23)
BUN: 15 mg/dL (ref 8–23)
CO2: 24 mmol/L (ref 22–32)
CO2: 25 mmol/L (ref 22–32)
Calcium: 8.9 mg/dL (ref 8.9–10.3)
Calcium: 8.7 mg/dL — ABNORMAL LOW (ref 8.9–10.3)
Chloride: 107 mmol/L (ref 98–111)
Chloride: 105 mmol/L (ref 98–111)
Creatinine, Ser: 1.04 mg/dL (ref 0.61–1.24)
Creatinine, Ser: 1.14 mg/dL (ref 0.61–1.24)
GFR, Estimated: >60 mL/min (ref >60)
GFR, Estimated: >60 mL/min (ref >60)
Glucose, Bld: 175 mg/dL — ABNORMAL HIGH (ref 70–99)
Glucose, Bld: 167 mg/dL — ABNORMAL HIGH (ref 70–99)
Potassium: 3.7 mmol/L (ref 3.5–5.1)
Potassium: 3.7 mmol/L (ref 3.5–5.1)
Sodium: 139 mmol/L (ref 135–145)
Sodium: 137 mmol/L (ref 135–145)

## 2023-01-19 LAB — MAGNESIUM: Magnesium: 1.9 mg/dL (ref 1.7–2.4)

## 2023-01-19 MED ORDER — ASPIRIN 81 MG PO CHEW
81.0000 mg | CHEWABLE_TABLET | Freq: Every morning | ORAL | Status: DC
  Administered 2023-01-20 – 2023-01-22 (×3): 81 mg via ORAL
  Filled 2023-01-19 (×3): qty 1

## 2023-01-19 MED ORDER — SODIUM CHLORIDE 0.9% FLUSH
3.0000 mL | Freq: Two times a day (BID) | INTRAVENOUS | Status: DC
  Administered 2023-01-20 – 2023-01-21 (×2): 3 mL via INTRAVENOUS

## 2023-01-19 MED ORDER — SODIUM CHLORIDE 0.9 % IV SOLN: 250.0000 mL | INTRAVENOUS | Status: DC | PRN

## 2023-01-19 MED ORDER — MAGNESIUM SULFATE 2 GM/50ML IV SOLN
2.0000 g | Freq: Once | INTRAVENOUS | Status: AC
  Administered 2023-01-19: 2 g via INTRAVENOUS
  Filled 2023-01-19: qty 50

## 2023-01-19 MED ORDER — DOFETILIDE 500 MCG PO CAPS
500.0000 ug | ORAL_CAPSULE | Freq: Two times a day (BID) | ORAL | Status: DC
  Administered 2023-01-19 – 2023-01-20 (×3): 500 ug via ORAL
  Filled 2023-01-19 (×3): qty 1

## 2023-01-19 MED ORDER — FUROSEMIDE 20 MG PO TABS
20.0000 mg | ORAL_TABLET | Freq: Every day | ORAL | Status: DC
  Administered 2023-01-20 – 2023-01-22 (×3): 20 mg via ORAL
  Filled 2023-01-19 (×3): qty 1

## 2023-01-19 MED ORDER — SODIUM CHLORIDE 0.9% FLUSH: 3.0000 mL | INTRAVENOUS | Status: DC | PRN

## 2023-01-19 MED ORDER — METOPROLOL TARTRATE 100 MG PO TABS
100.0000 mg | ORAL_TABLET | Freq: Two times a day (BID) | ORAL | Status: DC
  Administered 2023-01-19 – 2023-01-22 (×6): 100 mg via ORAL
  Filled 2023-01-19 (×6): qty 1

## 2023-01-19 MED ORDER — APIXABAN 5 MG PO TABS
5.0000 mg | ORAL_TABLET | Freq: Two times a day (BID) | ORAL | Status: DC
  Administered 2023-01-19 – 2023-01-22 (×6): 5 mg via ORAL
  Filled 2023-01-19 (×6): qty 1

## 2023-01-19 MED ORDER — POTASSIUM CHLORIDE CRYS ER 20 MEQ PO TBCR
60.0000 meq | EXTENDED_RELEASE_TABLET | Freq: Once | ORAL | Status: AC
  Administered 2023-01-19: 60 meq via ORAL
  Filled 2023-01-19: qty 3

## 2023-01-19 MED ORDER — DILTIAZEM HCL ER COATED BEADS 180 MG PO CP24
180.0000 mg | ORAL_CAPSULE | Freq: Every day | ORAL | Status: DC
  Administered 2023-01-20 – 2023-01-22 (×3): 180 mg via ORAL
  Filled 2023-01-19 (×3): qty 1

## 2023-01-19 MED ORDER — ATORVASTATIN CALCIUM 40 MG PO TABS
40.0000 mg | ORAL_TABLET | Freq: Every day | ORAL | Status: DC
  Administered 2023-01-19 – 2023-01-21 (×3): 40 mg via ORAL
  Filled 2023-01-19 (×3): qty 1

## 2023-01-19 MED ORDER — PANTOPRAZOLE SODIUM 40 MG PO TBEC
40.0000 mg | DELAYED_RELEASE_TABLET | Freq: Every day | ORAL | Status: DC
  Administered 2023-01-20 – 2023-01-22 (×3): 40 mg via ORAL
  Filled 2023-01-19 (×3): qty 1

## 2023-01-19 NOTE — Progress Notes (Addendum)
Primary Care Physician: Street, Stephanie Coup, MD Primary Cardiologist: Dr Tresa Endo Primary Electrophysiologist: Dr Elberta Fortis  Referring Physician: Dr Lucky Rathke is a 77 y.o. male with a history of CAD, HLD, HTN, OSA, atrial fibrillation who presents for follow up in the [facility]rial [facility]ibr[facility]. He was previously on amiodarone but developed skin toxicity. He had ablation x2 at [facility] by Dr. Sampson Goon in 2009. He has obstructive sleep apnea. June 2023 was found to be in atrial fibrillation. He had a cardioversion 10/05/2021. At that time, he had 3 unsuccessful attempts at cardioversion. Patient is on Eliquis for a CHADS2VASC score of 3. Patient was scheduled for ablation with Dr Elberta Fortis but the pre ablation CT and TEE showed LA thrombus and the surgery was cancelled. His Xarelto was changed to Eliquis. TEE on 04/03/22 showed persistent but smaller thrombus.   Patient is now s/p afib ablation with Dr Elberta Fortis on 08/27/22.   On follow up today, patient reports that he continues to have fatigue and SOB with exertion. He remains in afib. No bleeding issues on anticoagulation.   On follow up 01/19/23, patient is here for Tikosyn admission. No new medications since last OV. No benadryl use. No missed doses of anticoagulant.   Today, he denies symptoms of palpitations, chest pain, orthopnea, PND, lower extremity edema, dizziness, presyncope, syncope, snoring, daytime somnolence, bleeding, or neurologic sequela. The patient is tolerating medications without difficulties and is otherwise without complaint today.    Atrial Fibrillation Risk Factors:  he does have symptoms or diagnosis of sleep apnea. he is compliant with CPAP therapy. he does not have a history of rheumatic fever.   Atrial Fibrillation Management history:  Previous antiarrhythmic drugs: amiodarone  Previous cardioversions: several, most recently 10/05/21 Previous ablations: 2008, 2009,  08/27/22 Anticoagulation history: Xarelto, Eliquis   Past Medical History:  Diagnosis Date   Atrial fibrillation (HCC)    CAD (coronary artery disease)    LAD stent in 1999   Hyperlipidemia    Hypertension    PAF (paroxysmal atrial fibrillation) (HCC)    cardioversion in 2013   SSS (sick sinus syndrome) (HCC)     ROS- All systems are reviewed and negative except as per the HPI above.  Physical Exam: Vitals:   01/19/23 1311  BP: 128/66  Pulse: 86  Resp: 16  Temp: 97.9 F (36.6 C)  TempSrc: Oral  SpO2: 95%  Weight: 108 kg  Height: 5\' 6"  (1.676 m)    GEN- The patient is well appearing, alert and oriented x 3 today.   Neck - no JVD or carotid bruit noted Lungs- Clear to ausculation bilaterally, normal work of breathing Heart- Irregular rate and rhythm, no murmurs, rubs or gallops, PMI not laterally displaced Extremities- no clubbing, cyanosis, or edema Skin - no rash or ecchymosis noted   Wt Readings from Last 3 Encounters:  01/19/23 108 kg  01/19/23 107.5 kg  01/14/23 108 kg    EKG today demonstrates  Vent. rate 90 BPM PR interval * ms QRS duration 104 ms QT/QTcB 370/452 ms P-R-T axes * 104 -15 Atrial fibrillation with premature ventricular or aberrantly conducted complexes Rightward axis Low voltage QRS Abnormal QRS-T angle, consider primary T wave abnormality Abnormal ECG When compared with ECG of 14-Jan-2023 11:30, PREVIOUS ECG IS PRESENT   Echo 10/10/21 demonstrated   1. Left ventricular ejection fraction, by estimation, is 45-50% with beat  to beat variability. The left ventricle has mildly decreased function.  Left  ventricular endocardial border not optimally defined to evaluate  regional wall motion. There is mild left ventricular hypertrophy. Left ventricular diastolic parameters are indeterminate.   2. Right ventricular systolic function is mildly reduced. The right  ventricular size is normal. There is normal pulmonary artery systolic   pressure. The estimated right ventricular systolic pressure is 19.0 mmHg.   3. Left atrial size was mildly dilated.   4. The mitral valve is normal in structure. Mild mitral valve  regurgitation. No evidence of mitral stenosis.   5. The aortic valve is grossly normal. There is mild thickening of the  aortic valve. Aortic valve regurgitation is trivial. No aortic stenosis is  present.   6. The inferior vena cava is normal in size with greater than 50%  respiratory variability, suggesting right atrial pressure of 3 mmHg.   Comparison(s): A prior study was performed on 07/17/20. Prior images  reviewed side by side. LV function has decreased compared to prior exam.    Epic records are reviewed at length today  CHA2DS2-VASc Score = 3  The patient's score is based upon: CHF History: 0 HTN History: 1 Diabetes History: 0 Stroke History: 0 Vascular Disease History: 1 Age Score: 1 Gender Score: 0      ASSESSMENT AND PLAN: Persistent Atrial Fibrillation (ICD10:  I48.19) The patient's CHA2DS2-VASc score is 3, indicating a 3.2% annual risk of stroke.   S/p afib ablation 08/27/22  Patient would like to presents for dofetilide admission. Continue Eliquis 5 mg BID, states no missed doses in the last 3 weeks. No recent benadryl use PharmD has screened medications QTc in SR 456 ms by manual interpretation; reviewed by Otilio Saber, PA-C and Dr. Elberta Fortis in notes. CrCl calculated at 103 mL/min from 10/17 Cmet.    Secondary Hypercoagulable State (ICD10:  D68.69) The patient is at significant risk for stroke/thromboembolism based upon his CHA2DS2-VASc Score of 3.  Continue Apixaban (Eliquis).   Obesity Body mass index is 38.41 kg/m.  Encouraged lifestyle modification    Patient will present to admissions once a bed is available.    Justin Mend, PA-C Afib Clinic [facility] [address] [place], [place] [address] [phone] 01/19/2023 4:23  PM  _____________________________________________________  Admit H&P as above > for Tikosyn load / high risk drug monitoring in the setting of persistent AF. Patient's Cr Cl on 11/5 labs 8ml/min. On Eliquis for CHA2DS2-VASc 3.  No missed doses in last 3 weeks.  EKG with AF 78 bpm, QTC 382 ms, , , with QTcB 452 ms.  Tikosyn 500 mcg PO BID ordered.  Potassium & magnesium replacement in place. Follow post dose Qtc closely. Noted PVC's and couplets on telemetry pre-tikosyn.    Canary Brim, MSN, APRN, NP-C, AGACNP-BC [facility] - Electrophysiology  01/19/2023, 4:33 PM

## 2023-01-19 NOTE — Progress Notes (Addendum)
Pharmacy: Dofetilide (Tikosyn) - Initial Consult Assessment and Electrolyte Replacement  Pharmacy consulted to assist in monitoring and replacing electrolytes in this 77 y.o. male admitted on 01/19/2023 undergoing dofetilide initiation. First dofetilide dose: 01/19/23 PM   Assessment:  Patient Exclusion Criteria: If any screening criteria checked as "Yes", then  patient  should NOT receive dofetilide until criteria item is corrected.  If "Yes" please indicate correction plan.  YES  NO Patient  Exclusion Criteria Correction Plan   [x]   []   Baseline QTc interval is greater than or equal to 440 msec.  IF above YES box checked dofetilide contraindicated unless patient has ICD; then may proceed if QTc 500-550 msec or with known ventricular conduction abnormalities may proceed with QTc 550-600 msec. QTc =    456 per EP, ok to start dofetilide per EP   []   [x]   Patient is known or suspected to have a digoxin level greater than 2 ng/ml: No results found for: "DIGOXIN"     []   [x]   Creatinine clearance less than 20 ml/min (calculated using Cockcroft-Gault, actual body weight and serum creatinine): Estimated Creatinine Clearance: 70.7 mL/min (by C-G formula based on SCr of 1.04 mg/dL).     []   [x]  Patient has received drugs known to prolong the QT intervals within the last 48 hours (phenothiazines, tricyclics or tetracyclic antidepressants, erythromycin, H-1 antihistamines, cisapride, fluoroquinolones, azithromycin, ondansetron).   Updated information on QT prolonging agents is available to be searched on the following database:QT prolonging agents     []   [x]   Patient received a dose of hydrochlorothiazide (Oretic) alone or in any combination including triamterene (Dyazide, Maxzide) in the last 48 hours.    []   [x]  Patient received a medication known to increase dofetilide plasma concentrations prior to initial dofetilide dose:  Trimethoprim (Primsol, Proloprim) in the last 36  hours Verapamil (Calan, Verelan) in the last 36 hours or a sustained release dose in the last 72 hours Megestrol (Megace) in the last 5 days  Cimetidine (Tagamet) in the last 6 hours Ketoconazole (Nizoral) in the last 24 hours Itraconazole (Sporanox) in the last 48 hours  Prochlorperazine (Compazine) in the last 36 hours     []   [x]   Patient is known to have a history of torsades de pointes; congenital or acquired long QT syndromes.    []   [x]   Patient has received a Class 1 antiarrhythmic with less than 2 half-lives since last dose. (Disopyramide, Quinidine, Procainamide, Lidocaine, Mexiletine, Flecainide, Propafenone)    []   [x]   Patient has received amiodarone therapy in the past 3 months or amiodarone level is greater than 0.3 ng/ml.    Labs:    Component Value Date/Time   K 3.7 01/19/2023 1448   MG 1.9 01/19/2023 1448     Plan: Select One Calculated CrCl  Dose q12h  [x]  > 60 ml/min 500 mcg  []  40-60 ml/min 250 mcg  []  20-40 ml/min 125 mcg   [x]   Physician selected initial dose within range recommended for patients level of renal function - will monitor for response.  []   Physician selected initial dose outside of range recommended for patients level of renal function - will discuss if the dose should be altered at this time.   Patient has been appropriately anticoagulated with apixaban. No missed doses in last 3 weeks per patient..  Potassium: K 3.5-3.7:  Hold Tikosyn initiation and give KCl 60 mEq po x1 and repeat BMET 2hr after dose - repeat appropriate dose if K <  4    Magnesium: Mg 1.8-2: Give Mg 2 gm IV x1 to prevent Mg from dropping below 1.8 - do not need to recheck Mg. Appropriate to initiate Tikosyn  ADDENDUM 19:45: Repeat K still at 3.7 despite K 60 mEq PO. Will give another 60 mEq and start dofetilide.    Thank you for allowing pharmacy to participate in this patient's care   Alphia Moh, PharmD, BCPS, [facility] Clinical Pharmacist  Please check AMION  for all [facility] Pharmacy phone numbers After 10:00 PM, call Main Pharmacy [phone]

## 2023-01-19 NOTE — Progress Notes (Signed)
Primary Care Physician: Street, Stephanie Coup, MD Primary Cardiologist: Dr Tresa Endo Primary Electrophysiologist: Dr Elberta Fortis  Referring Physician: Dr Lucky Rathke is a 77 y.o. male with a history of CAD, HLD, HTN, OSA, atrial fibrillation who presents for follow up in the [facility]. He was previously on amiodarone but developed skin toxicity. He had ablation x2 at [facility] by Dr. Sampson Goon in 2009. He has obstructive sleep apnea. June 2023 was found to be in atrial fibrillation. He had a cardioversion 10/05/2021. At that time, he had 3 unsuccessful attempts at cardioversion. Patient is on Eliquis for a CHADS2VASC score of 3. Patient was scheduled for ablation with Dr Elberta Fortis but the pre ablation CT and TEE showed LA thrombus and the surgery was cancelled. His Xarelto was changed to Eliquis. TEE on 04/03/22 showed persistent but smaller thrombus.   Patient is now s/p afib ablation with Dr Elberta Fortis on 08/27/22.   On follow up today, patient reports that he continues to have fatigue and SOB with exertion. He remains in afib. No bleeding issues on anticoagulation.   On follow up 01/19/23, patient is here for Tikosyn admission. No new medications since last OV. No benadryl use. No missed doses of anticoagulant.   Today, he denies symptoms of palpitations, chest pain, orthopnea, PND, lower extremity edema, dizziness, presyncope, syncope, snoring, daytime somnolence, bleeding, or neurologic sequela. The patient is tolerating medications without difficulties and is otherwise without complaint today.    Atrial Fibrillation Risk Factors:  he does have symptoms or diagnosis of sleep apnea. he is compliant with CPAP therapy. he does not have a history of rheumatic fever.   Atrial Fibrillation Management history:  Previous antiarrhythmic drugs: amiodarone  Previous cardioversions: several, most recently 10/05/21 Previous ablations: 2008, 2009,  08/27/22 Anticoagulation history: Xarelto, Eliquis   Past Medical History:  Diagnosis Date   Atrial fibrillation (HCC)    CAD (coronary artery disease)    LAD stent in 1999   Hyperlipidemia    Hypertension    PAF (paroxysmal atrial fibrillation) (HCC)    cardioversion in 2013   SSS (sick sinus syndrome) (HCC)     ROS- All systems are reviewed and negative except as per the HPI above.  Physical Exam: Vitals:   01/19/23 0953  BP: (!) 140/82  Pulse: 90  Weight: 107.5 kg  Height: 5\' 6"  (1.676 m)    GEN- The patient is well appearing, alert and oriented x 3 today.   Neck - no JVD or carotid bruit noted Lungs- Clear to ausculation bilaterally, normal work of breathing Heart- Irregular rate and rhythm, no murmurs, rubs or gallops, PMI not laterally displaced Extremities- no clubbing, cyanosis, or edema Skin - no rash or ecchymosis noted   Wt Readings from Last 3 Encounters:  01/19/23 107.5 kg  01/14/23 108 kg  12/31/22 108 kg    EKG today demonstrates  Vent. rate 90 BPM PR interval * ms QRS duration 104 ms QT/QTcB 370/452 ms P-R-T axes * 104 -15 Atrial fibrillation with premature ventricular or aberrantly conducted complexes Rightward axis Low voltage QRS Abnormal QRS-T angle, consider primary T wave abnormality Abnormal ECG When compared with ECG of 14-Jan-2023 11:30, PREVIOUS ECG IS PRESENT   Echo 10/10/21 demonstrated   1. Left ventricular ejection fraction, by estimation, is 45-50% with beat  to beat variability. The left ventricle has mildly decreased function.  Left ventricular endocardial border not optimally defined to evaluate  regional wall motion. There is  mild left ventricular hypertrophy. Left ventricular diastolic parameters are indeterminate.   2. Right ventricular systolic function is mildly reduced. The right  ventricular size is normal. There is normal pulmonary artery systolic  pressure. The estimated right ventricular systolic pressure is 19.0  mmHg.   3. Left atrial size was mildly dilated.   4. The mitral valve is normal in structure. Mild mitral valve  regurgitation. No evidence of mitral stenosis.   5. The aortic valve is grossly normal. There is mild thickening of the  aortic valve. Aortic valve regurgitation is trivial. No aortic stenosis is  present.   6. The inferior vena cava is normal in size with greater than 50%  respiratory variability, suggesting right atrial pressure of 3 mmHg.   Comparison(s): A prior study was performed on 07/17/20. Prior images  reviewed side by side. LV function has decreased compared to prior exam.    Epic records are reviewed at length today  CHA2DS2-VASc Score = 3  The patient's score is based upon: CHF History: 0 HTN History: 1 Diabetes History: 0 Stroke History: 0 Vascular Disease History: 1 Age Score: 1 Gender Score: 0      ASSESSMENT AND PLAN: Persistent Atrial Fibrillation (ICD10:  I48.19) The patient's CHA2DS2-VASc score is 3, indicating a 3.2% annual risk of stroke.   S/p afib ablation 08/27/22  Patient would like to presents for dofetilide admission. Continue Eliquis 5 mg BID, states no missed doses in the last 3 weeks. No recent benadryl use PharmD has screened medications QTc in SR 456 ms by manual interpretation; reviewed by Otilio Saber, PA-C and Dr. Elberta Fortis in notes. CrCl calculated at 103 mL/min from 10/17 Cmet.    Secondary Hypercoagulable State (ICD10:  D68.69) The patient is at significant risk for stroke/thromboembolism based upon his CHA2DS2-VASc Score of 3.  Continue Apixaban (Eliquis).   Obesity Body mass index is 38.25 kg/m.  Encouraged lifestyle modification    Patient will present to admissions once a bed is available.    Justin Mend, PA-C [facility] [address] [place], [place] [address] [phone] 01/19/2023 10:26 AM

## 2023-01-20 ENCOUNTER — Other Ambulatory Visit: Payer: Self-pay

## 2023-01-20 DIAGNOSIS — I4819 Other persistent atrial fibrillation: Secondary | ICD-10-CM | POA: Diagnosis not present

## 2023-01-20 LAB — BASIC METABOLIC PANEL
Anion gap: 6 (ref 5–15)
BUN: 16 mg/dL (ref 8–23)
CO2: 23 mmol/L (ref 22–32)
Calcium: 8.8 mg/dL — ABNORMAL LOW (ref 8.9–10.3)
Chloride: 111 mmol/L (ref 98–111)
Creatinine, Ser: 0.97 mg/dL (ref 0.61–1.24)
GFR, Estimated: >60 mL/min (ref >60)
Glucose, Bld: 146 mg/dL — ABNORMAL HIGH (ref 70–99)
Potassium: 4.4 mmol/L (ref 3.5–5.1)
Sodium: 140 mmol/L (ref 135–145)

## 2023-01-20 LAB — MAGNESIUM: Magnesium: 2.2 mg/dL (ref 1.7–2.4)

## 2023-01-20 MED ORDER — SODIUM CHLORIDE 0.9 % IV SOLN: INTRAVENOUS | Status: DC

## 2023-01-20 NOTE — Progress Notes (Addendum)
Electrophysiology Rounding Note  [name] Name: Timothy [name] Date of Encounter: 01/20/2023  Primary Cardiologist: Nicki Guadalajara, [name]  Electrophysiologist: Regan Lemming, [name]    Subjective   Timothy [name] remains in afib on Tikosyn 500 mcg BID   QTc from EKG last pm shows stable QTcB at 447 ms  Timothy [name] is doing well today.  At Timothy time, Timothy [name] denies chest pain, shortness of breath, or any new concerns.  Inpatient Medications    Scheduled Meds:  apixaban  5 mg Oral BID   aspirin  81 mg Oral q AM   atorvastatin  40 mg Oral Daily   diltiazem  180 mg Oral Daily   dofetilide  500 mcg Oral BID   furosemide  20 mg Oral Daily   metoprolol tartrate  100 mg Oral BID   pantoprazole  40 mg Oral Daily   sodium chloride flush  3 mL Intravenous Q12H   Continuous Infusions:  PRN Meds: sodium chloride flush   Vital Signs    Vitals:   01/19/23 1311 01/19/23 2003 01/20/23 0806 01/20/23 0818  BP: 128/66 139/87 125/86 125/86  Pulse: 86 81 96 98  Resp: 16 17 16    Temp: 97.9 F (36.6 C) 98.6 F (37 C) 98.6 F (37 C)   TempSrc: Oral Oral Oral   SpO2: 95% 95% 97%   Weight: 108 kg     Height: 5\' 6"  (1.676 m)       Intake/Output Summary (Last 24 hours) at 01/20/2023 0911 Last data filed at 01/20/2023 0826 Gross per 24 hour  Intake 55 ml  Output --  Net 55 ml   Filed Weights   01/19/23 1311  Weight: 108 kg    Physical Exam    GEN- NAD, A&O x 3. Normal affect.  Lungs- CTAB, Normal effort.  Heart- Irregularly irregular rate and rhythm. No M/G/R GI- Soft, NT, ND Extremities- No clubbing, cyanosis, or edema Skin- no rash or lesion  Labs    CBC No results for input(s): "WBC", "NEUTROABS", "HGB", "HCT", "MCV", "PLT" in Timothy last 72 hours. Basic Metabolic Panel Recent Labs    03/17/70 1448 01/19/23 1902 01/20/23 0510  NA 139 137 140  K 3.7 3.7 4.4  CL 107 105 111  CO2 24 25 23   GLUCOSE 175* 167* 146*  BUN 15 15 16   CREATININE 1.04 1.14 0.97  CALCIUM 8.9  8.7* 8.8*  MG 1.9  --  2.2    Telemetry    AF 90-100's, PVC's (noted before Tikosyn, no increase post start) (personally reviewed)  [name] Profile     Timothy [name] is a 77 y.o. male with a past medical history significant for persistent atrial fibrillation.  Timothy [name] were admitted for tikosyn load.   Assessment & Plan    Persistent Atrial Fibrillation Timothy [name] remains in afib on Tikosyn 500 mcg BID  Continue Eliquis 5mg  BID Creatinine, ser  0.97 (11/06 0510) Magnesium  2.2 (11/06 0510) Potassium4.4 (11/06 0510) No electrolyte supplementation needed  If Timothy [name] does not convert chemically, plan on DCCV Thursday    For questions or updates, please contact [facility] Please consult [email or web address] for contact info under Cardiology/STEMI.  Signed, Canary Brim, MSN, APRN, NP-C, AGACNP-BC [facility] - Electrophysiology  01/20/2023, 9:11 AM  I have seen and examined Timothy [name].  Agree with above, note added to reflect my findings.  Admit for tikosyn load. Remains in atrial fibrillation. Fatigue and mild SOB.  GEN: Well nourished, well  developed, in no acute distress  HEENT: normal  Neck: no JVD, carotid bruits, or masses Cardiac: irregular; no murmurs, rubs, or gallops,no edema  Respiratory:  clear to auscultation bilaterally, normal work of breathing GI: soft, nontender, nondistended, + BS MS: no deformity or atrophy  Skin: warm and dry Neuro:  Strength and sensation are intact Psych: euthymic mood, full affect   Persistent atrial fibrillation: tikosyn load in progress. Timothy [name] likely need DCCV. K and Mg normal. Plan DCCV tomorrow. Secondary hypercoagulable state: continue eliquis  Timothy [name] 01/20/2023 12:00 PM

## 2023-01-20 NOTE — Progress Notes (Signed)
Morning EKG reviewed     Shows pt remains in afib at 88 bpm with stable QTc at 431 ms.  Continue  Tikosyn 500 mcg BID.   Potassium4.4 (11/06 0510) Magnesium  2.2 (11/06 0510) Creatinine, ser  0.97 (11/06 0510)  Pt will be NPO after midnight for DCCV if remains in afib     Canary Brim, MSN, APRN, NP-C, AGACNP-BC [facility] - Electrophysiology  01/20/2023, 10:57 AM

## 2023-01-20 NOTE — Progress Notes (Signed)
Post Tikosyn EKG shows AFIB w/QTc 479

## 2023-01-20 NOTE — Anesthesia Preprocedure Evaluation (Signed)
Anesthesia Evaluation  Patient identified by MRN, date of birth, ID band Patient awake    Reviewed: Allergy & Precautions, NPO status , Patient's Chart, lab work & pertinent test results, reviewed documented beta blocker date and time   Airway Mallampati: II  TM Distance: >3 FB Neck ROM: Full    Dental  (+) Teeth Intact, Dental Advisory Given   Pulmonary former smoker   Pulmonary exam normal breath sounds clear to auscultation       Cardiovascular hypertension, Pt. on medications and Pt. on home beta blockers + CAD and + Cardiac Stents  + dysrhythmias Atrial Fibrillation  Rhythm:Irregular Rate:Abnormal     Neuro/Psych negative neurological ROS     GI/Hepatic Neg liver ROS,GERD  Medicated,,  Endo/Other  Obesity   Renal/GU negative Renal ROS     Musculoskeletal negative musculoskeletal ROS (+)    Abdominal   Peds  Hematology  (+) Blood dyscrasia (Eliquis)   Anesthesia Other Findings   Reproductive/Obstetrics                             Anesthesia Physical Anesthesia Plan  ASA: 3  Anesthesia Plan: General   Post-op Pain Management: Minimal or no pain anticipated   Induction: Intravenous  PONV Risk Score and Plan: 2 and TIVA and Treatment may vary due to age or medical condition  Airway Management Planned: Nasal Cannula and Natural Airway  Additional Equipment:   Intra-op Plan:   Post-operative Plan:   Informed Consent: I have reviewed the patients History and Physical, chart, labs and discussed the procedure including the risks, benefits and alternatives for the proposed anesthesia with the patient or authorized representative who has indicated his/her understanding and acceptance.     Dental advisory given  Plan Discussed with: CRNA  Anesthesia Plan Comments:        Anesthesia Quick Evaluation

## 2023-01-20 NOTE — Care Management (Signed)
Spoke w patient and wife at the bedside.  Patient is admitted for Tikosyn initiation with projected DC for Friday 11/8.  Benefit check resulted in $5.77 price, and patient and wife informed.  They are agreeable to first month's fill through [facility] pharmacy, and would like Rx sent to   [facility] mail order [phone]

## 2023-01-20 NOTE — Progress Notes (Signed)
Pharmacy: Dofetilide (Tikosyn) - Follow Up Assessment and Electrolyte Replacement  Pharmacy consulted to assist in monitoring and replacing electrolytes in this 77 y.o. male admitted on 01/19/2023 undergoing dofetilide initiation.    Labs:    Component Value Date/Time   K 4.4 01/20/2023 0510   MG 2.2 01/20/2023 0510     Plan: Potassium: K >/= 4: No additional supplementation needed  Magnesium: Mg > 2: No additional supplementation needed  Thank you for allowing pharmacy to participate in this patient's care   Harland German, PharmD Clinical Pharmacist **Pharmacist phone directory can now be found on [email or web address] ([email or web address]).  Listed under [facility].

## 2023-01-21 ENCOUNTER — Other Ambulatory Visit: Payer: Self-pay

## 2023-01-21 ENCOUNTER — Encounter (HOSPITAL_COMMUNITY)
Admission: AD | Disposition: A | Discharge status: Home / Self Care | Payer: Self-pay | Source: Ambulatory Visit | Attending: Cardiology

## 2023-01-21 ENCOUNTER — Inpatient Hospital Stay (HOSPITAL_COMMUNITY): Payer: Medicare Other | Admitting: Anesthesiology

## 2023-01-21 DIAGNOSIS — I4819 Other persistent atrial fibrillation: Secondary | ICD-10-CM | POA: Diagnosis not present

## 2023-01-21 DIAGNOSIS — I4891 Unspecified atrial fibrillation: Secondary | ICD-10-CM | POA: Diagnosis not present

## 2023-01-21 HISTORY — PX: CARDIOVERSION: EP1203

## 2023-01-21 LAB — BASIC METABOLIC PANEL
Anion gap: 6 (ref 5–15)
BUN: 14 mg/dL (ref 8–23)
CO2: 25 mmol/L (ref 22–32)
Calcium: 8.6 mg/dL — ABNORMAL LOW (ref 8.9–10.3)
Chloride: 108 mmol/L (ref 98–111)
Creatinine, Ser: 1.1 mg/dL (ref 0.61–1.24)
GFR, Estimated: >60 mL/min (ref >60)
Glucose, Bld: 137 mg/dL — ABNORMAL HIGH (ref 70–99)
Potassium: 4.3 mmol/L (ref 3.5–5.1)
Sodium: 139 mmol/L (ref 135–145)

## 2023-01-21 LAB — MAGNESIUM: Magnesium: 2.1 mg/dL (ref 1.7–2.4)

## 2023-01-21 SURGERY — CARDIOVERSION (CATH LAB)
Anesthesia: General

## 2023-01-21 MED ORDER — DOFETILIDE 125 MCG PO CAPS
125.0000 ug | ORAL_CAPSULE | Freq: Two times a day (BID) | ORAL | Status: DC
  Administered 2023-01-21 – 2023-01-22 (×2): 125 ug via ORAL
  Filled 2023-01-21 (×2): qty 1

## 2023-01-21 MED ORDER — PROPOFOL 10 MG/ML IV BOLUS
INTRAVENOUS | Status: DC | PRN
  Administered 2023-01-21: 60 mg via INTRAVENOUS

## 2023-01-21 MED ORDER — LIDOCAINE 2% (20 MG/ML) 5 ML SYRINGE
INTRAMUSCULAR | Status: DC | PRN
  Administered 2023-01-21: 60 mg via INTRAVENOUS

## 2023-01-21 MED ORDER — HYDROCORTISONE 0.5 % EX CREA
TOPICAL_CREAM | Freq: Three times a day (TID) | CUTANEOUS | Status: DC | PRN
  Filled 2023-01-21: qty 28.35

## 2023-01-21 MED ORDER — DOFETILIDE 250 MCG PO CAPS
250.0000 ug | ORAL_CAPSULE | Freq: Two times a day (BID) | ORAL | Status: DC
  Administered 2023-01-21: 250 ug via ORAL
  Filled 2023-01-21 (×2): qty 1

## 2023-01-21 MED ORDER — ACETAMINOPHEN 325 MG PO TABS
650.0000 mg | ORAL_TABLET | Freq: Four times a day (QID) | ORAL | Status: DC | PRN
  Administered 2023-01-21: 650 mg via ORAL
  Filled 2023-01-21: qty 2

## 2023-01-21 SURGICAL SUPPLY — 1 items: PAD DEFIB RADIO PHYSIO CONN (PAD) ×1 IMPLANT

## 2023-01-21 NOTE — Progress Notes (Signed)
EKG from yesterday evening 01/20/2023 reviewed     Shows pt remains in afib at 81 bpm with stable QTc at 451 ms.  Continue  Tikosyn 500 mcg BID.   Potassium4.3 (11/07 0503) Magnesium  2.1 (11/07 0503) Creatinine, ser  1.10 (11/07 0503)  NPO for DCCV 11/7     Canary Brim, MSN, APRN, NP-C, AGACNP-BC [facility] - Electrophysiology  01/21/2023, 6:36 AM

## 2023-01-21 NOTE — Interval H&P Note (Signed)
History and Physical Interval Note:  01/21/2023 7:39 AM  Timothy [name]  has presented today for surgery, with the diagnosis of afib.  The various methods of treatment have been discussed with the patient and family. After consideration of risks, benefits and other options for treatment, the patient has consented to  Procedure(s): CARDIOVERSION (CATH LAB) (N/A) as a surgical intervention.  The patient's history has been reviewed, patient examined, no change in status, stable for surgery.  I have reviewed the patient's chart and labs.  Questions were answered to the patient's satisfaction.    Has been on anticoagulation without interruptions.   Update wife when done.   Tessa Lerner, DO, [facility]  [facility]  [address] [place], [place] [address]

## 2023-01-21 NOTE — Progress Notes (Signed)
Pharmacy: Dofetilide (Tikosyn) - Follow Up Assessment and Electrolyte Replacement  Pharmacy consulted to assist in monitoring and replacing electrolytes in this 77 y.o. male admitted on 01/19/2023 undergoing dofetilide initiation.   Labs:    Component Value Date/Time   K 4.3 01/21/2023 0503   MG 2.1 01/21/2023 0503     Plan: Potassium: K >/= 4: No additional supplementation needed  Magnesium: Mg > 2: No additional supplementation needed   He received a total of of oral potassium on 11/5 but none since then. I anticipate no need for a potassium supplement at discharge  Thank you for allowing pharmacy to participate in this patient's care   Harland German, PharmD Clinical Pharmacist **Pharmacist phone directory can now be found on [email or web address] ([email or web address]).  Listed under [facility].

## 2023-01-21 NOTE — Anesthesia Postprocedure Evaluation (Signed)
Anesthesia Post Note  Patient: Timothy [name]  Procedure(s) Performed: CARDIOVERSION (CATH LAB)     Patient location during evaluation: Cath Lab Anesthesia Type: General Level of consciousness: awake and alert Pain management: pain level controlled Vital Signs Assessment: post-procedure vital signs reviewed and stable Respiratory status: spontaneous breathing, nonlabored ventilation and respiratory function stable Cardiovascular status: blood pressure returned to baseline and stable Postop Assessment: no apparent nausea or vomiting Anesthetic complications: no   No notable events documented.  Last Vitals:  Vitals:   01/21/23 0852 01/21/23 0900  BP: 138/85   Pulse: 60   Resp:    Temp:  (!) 36.4 C  SpO2:      Last Pain:  Vitals:   01/21/23 0900  TempSrc: Oral  PainSc: 0-No pain                 Collene Schlichter

## 2023-01-21 NOTE — H&P (View-Only) (Signed)
EKG from yesterday evening 01/20/2023 reviewed     Shows pt remains in afib at 81 bpm with stable QTc at 451 ms.  Continue  Tikosyn 500 mcg BID.   Potassium4.3 (11/07 0503) Magnesium  2.1 (11/07 0503) Creatinine, ser  1.10 (11/07 0503)  NPO for DCCV 11/7     Timothy Brim, MSN, APRN, NP-C, AGACNP-BC [facility] - Electrophysiology  01/21/2023, 6:36 AM

## 2023-01-21 NOTE — Progress Notes (Addendum)
Electrophysiology Rounding Note  Patient Name: Timothy [name] Date of Encounter: 01/21/2023  Primary Cardiologist: Nicki Guadalajara, MD  Electrophysiologist: Regan Lemming, MD    Subjective   Pt converted to sinus rhythm post DCCV on Tikosyn 500 mcg BID   QTc from EKG post cardioversion shows prolonged QTc at 450-437ms.  Reviewed with Dr. Elberta Fortis   The patient is doing well today.  At this time, the patient denies chest pain, shortness of breath, or any new concerns.  Inpatient Medications    Scheduled Meds:  apixaban  5 mg Oral BID   aspirin  81 mg Oral q AM   atorvastatin  40 mg Oral Daily   diltiazem  180 mg Oral Daily   dofetilide  250 mcg Oral BID   furosemide  20 mg Oral Daily   metoprolol tartrate  100 mg Oral BID   pantoprazole  40 mg Oral Daily   sodium chloride flush  3 mL Intravenous Q12H   Continuous Infusions:  PRN Meds: sodium chloride flush   Vital Signs    Vitals:   01/21/23 0800 01/21/23 0807 01/21/23 0852 01/21/23 0900  BP: 129/82 131/81 138/85 138/85  Pulse:  (!) 51 60 60  Resp:    15  Temp:    (!) 97.5 F (36.4 C)  TempSrc:    Oral  SpO2:      Weight:      Height:        Intake/Output Summary (Last 24 hours) at 01/21/2023 1009 Last data filed at 01/21/2023 0726 Gross per 24 hour  Intake 240 ml  Output 0 ml  Net 240 ml   Filed Weights   01/19/23 1311  Weight: 108 kg    Physical Exam    GEN- NAD, A&O x 3. Normal affect.  Lungs- CTAB, Normal effort.  Heart- Regular rate and rhythm. No M/G/R GI- Soft, NT, ND Extremities- No clubbing, cyanosis, or edema Skin- no rash or lesion  Labs    CBC No results for input(s): "WBC", "NEUTROABS", "HGB", "HCT", "MCV", "PLT" in the last 72 hours. Basic Metabolic Panel Recent Labs    40/98/11 0510 01/21/23 0503  NA 140 139  K 4.4 4.3  CL 111 108  CO2 23 25  GLUCOSE 146* 137*  BUN 16 14  CREATININE 0.97 1.10  CALCIUM 8.8* 8.6*  MG 2.2 2.1    Telemetry    AF 70-110 with  PVC's (personally reviewed)  Patient Profile     Timothy [name] is a 77 y.o. male with a past medical history significant for persistent atrial fibrillation.  They were admitted for tikosyn load.   Assessment & Plan     Persistent Atrial Fibrillation Pt converted to sinus rhythm post DCCV.  Tikosyn reduced to 250 mcg BID post cardioversion Continue Eliquis Creatinine, ser  1.10 (11/07 0503) Magnesium  2.1 (11/07 0503) Potassium4.3 (11/07 0503) No electrolyte supplementation needed   Plan for home Friday if QTc remains stable.   For questions or updates, please contact [facility] Please consult [email or web address] for contact info under Cardiology/STEMI.  Signed, Canary Brim, MSN, APRN, NP-C, AGACNP-BC [facility] - Electrophysiology  01/21/2023, 10:13 AM   I have seen and examined this patient with Canary Brim.  Agree with above, note added to reflect my findings.  Patient is post cardioversion today.  QT is mildly prolonged.  GEN: Well nourished, well developed, in no acute distress  HEENT: normal  Neck: no JVD, carotid bruits, or masses Cardiac:  RRR; no murmurs, rubs, or gallops,no edema  Respiratory:  clear to auscultation bilaterally, normal work of breathing GI: soft, nontender, nondistended, + BS MS: no deformity or atrophy  Skin: warm and dry Neuro:  Strength and sensation are intact Psych: euthymic mood, full affect   System atrial fibrillation: Post cardioversion today.  QT mildly prolonged and thus dose reduced.  If QT remains stable, likely discharge home tomorrow.  Ariba Lehnen M. Kayleb Warshaw MD 01/21/2023 10:20 AM

## 2023-01-21 NOTE — Progress Notes (Signed)
Morning EKG reviewed     Shows is in NSR s/p DCC (& post tikosyn dose, was delayed until after DCCV) at 53 bpm with prolonged QTc at 526 ms.  Continue  Tikosyn 125 mcg BID.   Potassium4.3 (11/07 0503) Magnesium  2.1 (11/07 0503) Creatinine, ser  1.10 (11/07 0503)  Plan for home Friday if QTc remains stable    Canary Brim, MSN, APRN, NP-C, AGACNP-BC [facility] - Electrophysiology  01/21/2023, 1:36 PM

## 2023-01-21 NOTE — Anesthesia Procedure Notes (Signed)
Procedure Name: MAC Date/Time: 01/21/2023 7:52 AM  Performed by: Stanton Kidney, CRNAOxygen Delivery Method: Simple face mask

## 2023-01-21 NOTE — Transfer of Care (Signed)
Immediate Anesthesia Transfer of Care Note  Patient: Timothy [name]  Procedure(s) Performed: CARDIOVERSION (CATH LAB)  Patient Location: PACU  Anesthesia Type:MAC  Level of Consciousness: awake and drowsy  Airway & Oxygen Therapy: Patient Spontanous Breathing and Patient connected to face mask oxygen  Post-op Assessment: Report given to RN and Post -op Vital signs reviewed and stable  Post vital signs: Reviewed and stable  Last Vitals:  Vitals Value Taken Time  BP 149/101 01/21/23 0745  Temp    Pulse 92 01/21/23 0746  Resp 14 01/21/23 0746  SpO2 95 % 01/21/23 0746  Vitals shown include unfiled device data.  Last Pain:  Vitals:   01/21/23 0711  TempSrc:   PainSc: 0-No pain      Patients Stated Pain Goal: 0 (01/20/23 0800)  Complications: No notable events documented.

## 2023-01-21 NOTE — Progress Notes (Signed)
Post cardioversion EKG reviewed with Dr. Elberta Fortis, will reduce Tikosyn dosing to BID.    Canary Brim, MSN, APRN, NP-C, AGACNP-BC [facility] - Electrophysiology  01/21/2023, 8:08 AM

## 2023-01-21 NOTE — Progress Notes (Signed)
Pt had 16 beat run of vtach. Pt asymptomatic

## 2023-01-21 NOTE — CV Procedure (Signed)
   DIRECT CURRENT CARDIOVERSION  NAME:  Timothy [name]    MRN: [id number] DOB:  Dec 01, 1945    ADMIT DATE: 01/19/2023  Indication:  Symptomatic atrial fibrillation  Procedure Note:  The patient signed informed consent.  They have had had therapeutic anticoagulation with Eliquis greater than 3 weeks.  Anesthesia was administered by Dr. Desmond Lope.  Adequate airway was maintained throughout and vital followed per protocol.  He was  cardioverted x 2 with 360J of biphasic synchronized energy.  They converted to NSR.  There were no apparent complications.  The patient had normal neuro status and respiratory status post procedure with vitals stable as recorded elsewhere.    Follow up: They will continue on current medical therapy and follow up with cardiology as scheduled.  Wife update post procedure.    Tessa Lerner, DO, [facility]  [facility]  [address] [place], [place] [address] [phone] 7:52 AM

## 2023-01-22 ENCOUNTER — Encounter (HOSPITAL_COMMUNITY): Payer: Self-pay | Admitting: Cardiology

## 2023-01-22 ENCOUNTER — Other Ambulatory Visit: Payer: Self-pay

## 2023-01-22 ENCOUNTER — Other Ambulatory Visit (HOSPITAL_COMMUNITY): Payer: Self-pay

## 2023-01-22 DIAGNOSIS — I4819 Other persistent atrial fibrillation: Secondary | ICD-10-CM | POA: Diagnosis not present

## 2023-01-22 LAB — BASIC METABOLIC PANEL
Anion gap: 6 (ref 5–15)
BUN: 15 mg/dL (ref 8–23)
CO2: 25 mmol/L (ref 22–32)
Calcium: 8.6 mg/dL — ABNORMAL LOW (ref 8.9–10.3)
Chloride: 108 mmol/L (ref 98–111)
Creatinine, Ser: 1.05 mg/dL (ref 0.61–1.24)
GFR, Estimated: >60 mL/min (ref >60)
Glucose, Bld: 131 mg/dL — ABNORMAL HIGH (ref 70–99)
Potassium: 4 mmol/L (ref 3.5–5.1)
Sodium: 139 mmol/L (ref 135–145)

## 2023-01-22 LAB — MAGNESIUM: Magnesium: 2.1 mg/dL (ref 1.7–2.4)

## 2023-01-22 MED ORDER — POTASSIUM CHLORIDE CRYS ER 10 MEQ PO TBCR
10.0000 meq | EXTENDED_RELEASE_TABLET | Freq: Every day | ORAL | 3 refills | Status: DC
  Filled 2023-01-22 – 2023-02-16 (×2): qty 30, 30d supply, fill #0

## 2023-01-22 MED ORDER — DOFETILIDE 125 MCG PO CAPS
125.0000 ug | ORAL_CAPSULE | Freq: Two times a day (BID) | ORAL | 3 refills | Status: DC
  Filled 2023-01-22: qty 60, 30d supply, fill #0

## 2023-01-22 NOTE — Discharge Instructions (Addendum)
Review your medications carefully as they have changed Follow up in the AF Clinic in one week  Avoid over the counter benadryl and imodium  Check with your pharmacist if you have new medications added to review for interactions with Tikosyn

## 2023-01-22 NOTE — Progress Notes (Signed)
Pharmacy: Dofetilide (Tikosyn) - Follow Up Assessment and Electrolyte Replacement  Pharmacy consulted to assist in monitoring and replacing electrolytes in this 77 y.o. male admitted on 01/19/2023 undergoing dofetilide initiation.   Labs:    Component Value Date/Time   K 4.0 01/22/2023 0358   MG 2.1 01/22/2023 0358     Plan: Potassium: K >/= 4: No additional supplementation needed  Magnesium: Mg > 2: No additional supplementation needed   He received a total of of oral potassium on 11/5 but none since then. On discharge would consider  Potassium chloride 10 mEq  daily  Thank you for allowing pharmacy to participate in this patient's care   Harland German, PharmD Clinical Pharmacist **Pharmacist phone directory can now be found on [email or web address] ([email or web address]).  Listed under [facility].

## 2023-01-22 NOTE — Progress Notes (Signed)
Post-Tikosyn #6 EKG complete and reviewed:  Sinus bradycardia HR 56 PR 136 msec QRS  112 msec QT 482 msec QTc 465 msec Normal axis  Notified NP, Canary Brim by secure message.  Continuing to monitor.

## 2023-01-22 NOTE — Discharge Summary (Addendum)
ELECTROPHYSIOLOGY DISCHARGE SUMMARY    Patient ID: Timothy [name],  MRN: [id number], DOB/AGE: 1945/06/11 77 y.o.  Admit date: 01/19/2023 Discharge date: 01/22/2023  Primary Care Physician: Street, Stephanie Coup, MD  Primary Cardiologist: Nicki Guadalajara, MD  Electrophysiologist: Dr. Elberta Fortis   Primary Discharge Diagnosis:  1.  Persistent atrial fibrillation status post Tikosyn loading this admission  Secondary Discharge Diagnosis:  Secondary Hypercoagulable State  High Risk Drug Monitoring  Hypokalemia / Hypomagnesemia    Allergies  Allergen Reactions   Nitroglycerin Other (See Comments)    "Blood Pressure goes to zero"     Procedures This Admission:  1.  Tikosyn loading  2.  Direct current cardioversion on Thursday January 21, 2023 by Dr. Odis Hollingshead which successfully restored SR.  There were no early apparent complications.   Brief HPI: Timothy [name] is a 77 y.o. male with a past medical history as noted above.  They were referred to EP for treatment options of atrial fibrillation.  Risks, benefits, and alternatives to Tikosyn were reviewed with the patient who wished to proceed with admission for loading.  Hospital Course:  The patient was admitted and Tikosyn was initiated.  Renal function and electrolytes were followed during the hospitalization.  Their QTc remained stable. On 11/7  they underwent direct current cardioversion which restored sinus rhythm. The patients QT prolonged, requiring dose reduction to mcg BID. They were monitored on telemetry up to discharge. On the day of discharge, they were examined by Dr. Elberta Fortis  who considered them stable for discharge to home.  Follow-up has been arranged with the Atrial Fibrillation clinic in approximately 1 week. He required potassium / magnesium supplementation while inpatient & discharged on 10 mEq KCL daily.  Follow up BMP / Mg+ in one week.   Physical Exam: Vitals:   01/21/23 1400 01/21/23 1953 01/22/23 0401  01/22/23 1015  BP: 127/68 135/73 134/85 120/79  Pulse: 60 72 (!) 55 (!) 55  Resp: 16 19 16 16   Temp: 98.7 F (37.1 C) 98.5 F (36.9 C) 98 F (36.7 C) 97.8 F (36.6 C)  TempSrc: Oral Oral Oral Oral  SpO2: 96% 95% 98% 100%  Weight:      Height:        GEN- NAD, A&O x 3. Normal affect.  Lungs- CTAB, Normal effort.  Heart- Regular rate and rhythm. No M/G/R GI- Soft, NT, ND Extremities- No clubbing, cyanosis, or edema Skin- no rash or lesion  Labs:   Lab Results  Component Value Date   WBC 5.7 12/31/2022   HGB 12.4 (L) 12/31/2022   HCT 38.3 12/31/2022   MCV 100 (H) 12/31/2022   PLT 222 12/31/2022    Recent Labs  Lab 01/22/23 0358  NA 139  K 4.0  CL 108  CO2 25  BUN 15  CREATININE 1.05  CALCIUM 8.6*  GLUCOSE 131*    Discharge Medications:  Allergies as of 01/22/2023       Reactions   Nitroglycerin Other (See Comments)   "Blood Pressure goes to zero"        Medication List     STOP taking these medications    diltiazem 180 MG 24 hr capsule Commonly known as: CARDIZEM CD       TAKE these medications    apixaban 5 MG Tabs tablet Commonly known as: ELIQUIS Take 1 tablet (5 mg total) by mouth 2 (two) times daily.   aspirin 81 MG chewable tablet Chew 81 mg by mouth in the  morning.   atorvastatin 40 MG tablet Commonly known as: LIPITOR TAKE ONE TABLET BY MOUTH EVERYDAY AT BEDTIME   dofetilide 125 MCG capsule Commonly known as: TIKOSYN Take 1 capsule (125 mcg total) by mouth 2 (two) times daily.   esomeprazole 20 MG capsule Commonly known as: NEXIUM Take 20 mg by mouth daily before breakfast.   Fish Oil 1000 MG Caps Take 1,000 mg by mouth daily.   furosemide 20 MG tablet Commonly known as: LASIX Take 1 tablet (20 mg total) by mouth daily.   GARLIC PO Take 1 capsule by mouth daily.   loratadine 10 MG tablet Commonly known as: CLARITIN Take 10 mg by mouth daily as needed for allergies.   metoprolol tartrate 100 MG tablet Commonly  known as: LOPRESSOR TAKE ONE TABLET BY MOUTH TWICE DAILY   potassium chloride 10 MEQ tablet Commonly known as: KLOR-CON M Take 1 tablet (10 mEq total) by mouth daily.   VITAMIN D PO Take 1 capsule by mouth daily.        Disposition:  Home with follow up in AF clinic in 1 week as in AVS.   Duration of Discharge Encounter: Greater than 30 minutes including physician time.  Signed, Timothy Brim, MSN, APRN, NP-C, AGACNP-BC [facility] - Electrophysiology  01/22/2023, 10:57 AM   I have seen and examined this patient with Timothy [name].  Agree with above, note added to reflect my findings.  Patient admitted to the hospital for dofetilide load for atrial fibrillation.  Was cardioverted during hospitalization.  QTc became significantly prolonged and thus dose reduced.  Letitia Sabala plan for discharge today with follow-up in clinic.  GEN: Well nourished, well developed, in no acute distress  HEENT: normal  Neck: no JVD, carotid bruits, or masses Cardiac: RRR; no murmurs, rubs, or gallops,no edema  Respiratory:  clear to auscultation bilaterally, normal work of breathing GI: soft, nontender, nondistended, + BS MS: no deformity or atrophy  Skin: warm and dry Neuro:  Strength and sensation are intact Psych: euthymic mood, full affect    Timothy Deleo M. Dayshawn Irizarry MD 01/22/2023 5:02 PM

## 2023-01-22 NOTE — Plan of Care (Signed)
  Problem: Health Behavior/Discharge Planning: Goal: Ability to manage health-related needs will improve Outcome: Adequate for Discharge   Problem: Clinical Measurements: Goal: Ability to maintain clinical measurements within normal limits will improve Outcome: Adequate for Discharge Goal: Will remain free from infection Outcome: Adequate for Discharge Goal: Diagnostic test results will improve Outcome: Adequate for Discharge Goal: Respiratory complications will improve Outcome: Adequate for Discharge Goal: Cardiovascular complication will be avoided Outcome: Adequate for Discharge   Problem: Activity: Goal: Risk for activity intolerance will decrease Outcome: Adequate for Discharge   Problem: Nutrition: Goal: Adequate nutrition will be maintained Outcome: Adequate for Discharge   Problem: Coping: Goal: Level of anxiety will decrease Outcome: Adequate for Discharge   Problem: Elimination: Goal: Will not experience complications related to bowel motility Outcome: Adequate for Discharge Goal: Will not experience complications related to urinary retention Outcome: Adequate for Discharge   Problem: Pain Management: Goal: General experience of comfort will improve Outcome: Adequate for Discharge   Problem: Safety: Goal: Ability to remain free from injury will improve Outcome: Adequate for Discharge   Problem: Skin Integrity: Goal: Risk for impaired skin integrity will decrease Outcome: Adequate for Discharge   Problem: Education: Goal: Knowledge of disease or condition will improve Outcome: Adequate for Discharge Goal: Understanding of medication regimen will improve Outcome: Adequate for Discharge Goal: Individualized Educational Video(s) Outcome: Adequate for Discharge   Problem: Activity: Goal: Ability to tolerate increased activity will improve Outcome: Adequate for Discharge   Problem: Cardiac: Goal: Ability to achieve and maintain adequate cardiopulmonary  perfusion will improve Outcome: Adequate for Discharge   Problem: Health Behavior/Discharge Planning: Goal: Ability to safely manage health-related needs after discharge will improve Outcome: Adequate for Discharge  Patient progression adequate for discharge. Completed Tikosyn loading with appropriate EKG measurements. Maintaining appropriate vital signs with no active complaints:   01/22/23 1015  Vitals  Temp 97.8 F (36.6 C)  Temp Source Oral  BP 120/79  MAP (mmHg) 91  BP Location Left Arm  BP Method Automatic  Patient Position (if appropriate) Lying  Pulse Rate (!) 55  Pulse Rate Source Monitor  ECG Heart Rate (!) 56  Resp 16  MEWS COLOR  MEWS Score Color Green  Oxygen Therapy  SpO2 100 %  O2 Device Room Air  MEWS Score  MEWS Temp 0  MEWS Systolic 0  MEWS Pulse 0  MEWS RR 0  MEWS LOC 0  MEWS Score 0   Will review discharge instructions, follow-up appointments and medications with patient/family. TOC pharmacy contacted by phone; medications will be ready for pick-up in approximately 30 minutes.

## 2023-01-22 NOTE — Care Management Important Message (Signed)
Important Message  Patient Details  Name: Timothy [name] MRN: [id number] Date of Birth: [birth year]   Important Message Given:  Yes - Medicare IM Patient left prior IM delivery will mail a copy to the home address.     Terresa Marlett 01/22/2023, 12:39 PM

## 2023-01-29 ENCOUNTER — Ambulatory Visit (HOSPITAL_COMMUNITY)
Admit: 2023-01-29 | Discharge: 2023-01-29 | Disposition: A | Discharge status: Home / Self Care | Payer: Medicare Other | Source: Ambulatory Visit | Attending: Internal Medicine | Admitting: Internal Medicine

## 2023-01-29 VITALS — BP 144/76 | HR 114 | Ht 66.0 in | Wt 235.4 lb

## 2023-01-29 DIAGNOSIS — I4819 Other persistent atrial fibrillation: Secondary | ICD-10-CM | POA: Diagnosis not present

## 2023-01-29 DIAGNOSIS — I10 Essential (primary) hypertension: Secondary | ICD-10-CM | POA: Diagnosis not present

## 2023-01-29 DIAGNOSIS — D6869 Other thrombophilia: Secondary | ICD-10-CM | POA: Diagnosis not present

## 2023-01-29 DIAGNOSIS — Z5181 Encounter for therapeutic drug level monitoring: Secondary | ICD-10-CM | POA: Insufficient documentation

## 2023-01-29 DIAGNOSIS — E669 Obesity, unspecified: Secondary | ICD-10-CM | POA: Insufficient documentation

## 2023-01-29 DIAGNOSIS — Z6837 Body mass index (BMI) 37.0-37.9, adult: Secondary | ICD-10-CM | POA: Insufficient documentation

## 2023-01-29 DIAGNOSIS — Z7901 Long term (current) use of anticoagulants: Secondary | ICD-10-CM | POA: Diagnosis not present

## 2023-01-29 DIAGNOSIS — Z79899 Other long term (current) drug therapy: Secondary | ICD-10-CM | POA: Diagnosis not present

## 2023-01-29 DIAGNOSIS — E785 Hyperlipidemia, unspecified: Secondary | ICD-10-CM | POA: Diagnosis not present

## 2023-01-29 DIAGNOSIS — Z86718 Personal history of other venous thrombosis and embolism: Secondary | ICD-10-CM | POA: Diagnosis not present

## 2023-01-29 DIAGNOSIS — I2510 Atherosclerotic heart disease of native coronary artery without angina pectoris: Secondary | ICD-10-CM | POA: Diagnosis not present

## 2023-01-29 DIAGNOSIS — G4733 Obstructive sleep apnea (adult) (pediatric): Secondary | ICD-10-CM | POA: Diagnosis not present

## 2023-01-29 LAB — BASIC METABOLIC PANEL
Anion gap: 7 (ref 5–15)
BUN: 12 mg/dL (ref 8–23)
CO2: 22 mmol/L (ref 22–32)
Calcium: 8.9 mg/dL (ref 8.9–10.3)
Chloride: 109 mmol/L (ref 98–111)
Creatinine, Ser: 1.35 mg/dL — ABNORMAL HIGH (ref 0.61–1.24)
GFR, Estimated: 55 mL/min — ABNORMAL LOW (ref >60)
Glucose, Bld: 110 mg/dL — ABNORMAL HIGH (ref 70–99)
Potassium: 4.1 mmol/L (ref 3.5–5.1)
Sodium: 138 mmol/L (ref 135–145)

## 2023-01-29 LAB — CBC
HCT: 47.2 % (ref 39.0–52.0)
Hemoglobin: 15.5 g/dL (ref 13.0–17.0)
MCH: 30.5 pg (ref 26.0–34.0)
MCHC: 32.8 g/dL (ref 30.0–36.0)
MCV: 92.9 fL (ref 80.0–100.0)
Platelets: 233 10*3/uL (ref 150–400)
RBC: 5.08 MIL/uL (ref 4.22–5.81)
RDW: 12.8 % (ref 11.5–15.5)
WBC: 8.1 10*3/uL (ref 4.0–10.5)
nRBC: 0 % (ref 0.0–0.2)

## 2023-01-29 LAB — MAGNESIUM: Magnesium: 2.2 mg/dL (ref 1.7–2.4)

## 2023-01-29 MED ORDER — DILTIAZEM HCL 30 MG PO TABS: ORAL_TABLET | ORAL | 1 refills | Status: DC

## 2023-01-29 MED ORDER — DOFETILIDE 125 MCG PO CAPS: 125.0000 ug | ORAL_CAPSULE | Freq: Two times a day (BID) | ORAL | 1 refills | Status: DC

## 2023-01-29 NOTE — Patient Instructions (Signed)
Call next week - let us know if continue if afib or not [place] RN [phone]

## 2023-01-29 NOTE — H&P (View-Only) (Signed)
Primary Care Physician: Street, Stephanie Coup, MD Primary Cardiologist: Dr Tresa Endo Primary Electrophysiologist: Dr Elberta Fortis  Referring Physician: Dr Lucky Rathke is a 77 y.o. male with a history of CAD, HLD, HTN, OSA, atrial fibrillation who presents for follow up in the [facility]. He was previously on amiodarone but developed skin toxicity. He had ablation x2 at [facility] by Dr. Sampson Goon in 2009. He has obstructive sleep apnea. June 2023 was found to be in atrial fibrillation. He had a cardioversion 10/05/2021. At that time, he had 3 unsuccessful attempts at cardioversion. Patient is on Eliquis for a CHADS2VASC score of 3. Patient was scheduled for ablation with Dr Elberta Fortis but the pre ablation CT and TEE showed LA thrombus and the surgery was cancelled. His Xarelto was changed to Eliquis. TEE on 04/03/22 showed persistent but smaller thrombus.   Patient is now s/p afib ablation with Dr Elberta Fortis on 08/27/22.   On follow up today, patient reports that he continues to have fatigue and SOB with exertion. He remains in afib. No bleeding issues on anticoagulation.   On follow up 01/19/23, patient is here for Tikosyn admission. No new medications since last OV. No benadryl use. No missed doses of anticoagulant.   On follow up 01/29/23, he is currently in Afib with RVR. S/p Tikosyn admission 11/5-8/24. S/p successful DCCV on 01/21/23 x 2 shocks with 360J. QT prolonged and discharged on Tikosyn 125 mcg BID. Discharged on K+ 10 meq daily. He went out of rhythm yesterday at 1515.   Today, he denies symptoms of palpitations, chest pain, orthopnea, PND, lower extremity edema, dizziness, presyncope, syncope, snoring, daytime somnolence, bleeding, or neurologic sequela. The patient is tolerating medications without difficulties and is otherwise without complaint today.    Atrial Fibrillation Risk Factors:  he does have symptoms or diagnosis of sleep apnea. he  is compliant with CPAP therapy. he does not have a history of rheumatic fever.   Atrial Fibrillation Management history:  Previous antiarrhythmic drugs: amiodarone, tikosyn Previous cardioversions: several, most recently 10/05/21 Previous ablations: 2008, 2009, 08/27/22 Anticoagulation history: Xarelto, Eliquis   Past Medical History:  Diagnosis Date   Atrial fibrillation (HCC)    CAD (coronary artery disease)    LAD stent in 1999   Hyperlipidemia    Hypertension    PAF (paroxysmal atrial fibrillation) (HCC)    cardioversion in 2013   SSS (sick sinus syndrome) (HCC)     ROS- All systems are reviewed and negative except as per the HPI above.  Physical Exam: Vitals:   01/29/23 0948  BP: (!) 144/76  Pulse: (!) 114  Weight: 106.8 kg  Height: 5\' 6"  (1.676 m)    GEN- The patient is well appearing, alert and oriented x 3 today.   Neck - no JVD or carotid bruit noted Lungs- Clear to ausculation bilaterally, normal work of breathing Heart- Tachycardic irregular rate and rhythm, no murmurs, rubs or gallops, PMI not laterally displaced Extremities- no clubbing, cyanosis, or edema Skin - no rash or ecchymosis noted   Wt Readings from Last 3 Encounters:  01/29/23 106.8 kg  01/19/23 108 kg  01/19/23 107.5 kg    EKG today demonstrates  Vent. rate 114 BPM PR interval * ms QRS duration 104 ms QT/QTcB 368/507 ms P-R-T axes * 118 -27 Atrial fibrillation with rapid ventricular response with premature ventricular or aberrantly conducted complexes Right axis deviation Low voltage QRS T wave abnormality, consider inferior ischemia Abnormal ECG When  compared with ECG of 22-Jan-2023 10:21, PREVIOUS ECG IS PRESENT   Echo 10/10/21 demonstrated   1. Left ventricular ejection fraction, by estimation, is 45-50% with beat  to beat variability. The left ventricle has mildly decreased function.  Left ventricular endocardial border not optimally defined to evaluate  regional wall  motion. There is mild left ventricular hypertrophy. Left ventricular diastolic parameters are indeterminate.   2. Right ventricular systolic function is mildly reduced. The right  ventricular size is normal. There is normal pulmonary artery systolic  pressure. The estimated right ventricular systolic pressure is 19.0 mmHg.   3. Left atrial size was mildly dilated.   4. The mitral valve is normal in structure. Mild mitral valve  regurgitation. No evidence of mitral stenosis.   5. The aortic valve is grossly normal. There is mild thickening of the  aortic valve. Aortic valve regurgitation is trivial. No aortic stenosis is  present.   6. The inferior vena cava is normal in size with greater than 50%  respiratory variability, suggesting right atrial pressure of 3 mmHg.   Comparison(s): A prior study was performed on 07/17/20. Prior images  reviewed side by side. LV function has decreased compared to prior exam.    Epic records are reviewed at length today  CHA2DS2-VASc Score = 3  The patient's score is based upon: CHF History: 0 HTN History: 1 Diabetes History: 0 Stroke History: 0 Vascular Disease History: 1 Age Score: 1 Gender Score: 0      ASSESSMENT AND PLAN: Persistent Atrial Fibrillation (ICD10:  I48.19) The patient's CHA2DS2-VASc score is 3, indicating a 3.2% annual risk of stroke.   S/p afib ablation 08/27/22. S/p Tikosyn admission 11/5-8/24.  S/p DCCV on 01/21/23.   He is currently in Afib with RVR. Begin diltiazem 30 mg TID PRN. Patient instructed to call us next week for update on rhythm. If he calls next week and still in Afib, will arrange cardioversion. Continue Tikosyn 125 mcg BID. Bmet and mag drawn today in addition to CBC. Continue K+ 10 meq daily.  Secondary Hypercoagulable State (ICD10:  D68.69) The patient is at significant risk for stroke/thromboembolism based upon his CHA2DS2-VASc Score of 3.  Continue Apixaban (Eliquis).   Obesity Body mass index is 37.99  kg/m.  Encouraged lifestyle modification    Patient will call clinic next week to update if converted to NSR or remained in Afib to help determine plan of care.    Justin Mend, PA-C Afib Clinic [facility] [address] [place], [place] [address] [phone] 01/29/2023 10:15 AM

## 2023-01-29 NOTE — Progress Notes (Signed)
Primary Care Physician: Street, Stephanie Coup, MD Primary Cardiologist: Dr Tresa Endo Primary Electrophysiologist: Dr Elberta Fortis  Referring Physician: Dr Lucky Rathke is a 77 y.o. male with a history of CAD, HLD, HTN, OSA, atrial fibrillation who presents for follow up in the [facility]ibr[facility]. He was previously on amiodarone but developed skin toxicity. He had ablation x2 at [facility] by Dr. Sampson Goon in 2009. He has obstructive sleep apnea. June 2023 was found to be in atrial fibrillation. He had a cardioversion 10/05/2021. At that time, he had 3 unsuccessful attempts at cardioversion. Patient is on Eliquis for a CHADS2VASC score of 3. Patient was scheduled for ablation with Dr Elberta Fortis but the pre ablation CT and TEE showed LA thrombus and the surgery was cancelled. His Xarelto was changed to Eliquis. TEE on 04/03/22 showed persistent but smaller thrombus.   Patient is now s/p afib ablation with Dr Elberta Fortis on 08/27/22.   On follow up today, patient reports that he continues to have fatigue and SOB with exertion. He remains in afib. No bleeding issues on anticoagulation.   On follow up 01/19/23, patient is here for Tikosyn admission. No new medications since last OV. No benadryl use. No missed doses of anticoagulant.   On follow up 01/29/23, he is currently in Afib with RVR. S/p Tikosyn admission 11/5-8/24. S/p successful DCCV on 01/21/23 x 2 shocks with 360J. QT prolonged and discharged on Tikosyn 125 mcg BID. Discharged on K+ 10 meq daily. He went out of rhythm yesterday at 1515.   Today, he denies symptoms of palpitations, chest pain, orthopnea, PND, lower extremity edema, dizziness, presyncope, syncope, snoring, daytime somnolence, bleeding, or neurologic sequela. The patient is tolerating medications without difficulties and is otherwise without complaint today.    Atrial Fibrillation Risk Factors:  he does have symptoms or diagnosis of sleep apnea. he  is compliant with CPAP therapy. he does not have a history of rheumatic fever.   Atrial Fibrillation Management history:  Previous antiarrhythmic drugs: amiodarone, tikosyn Previous cardioversions: several, most recently 10/05/21 Previous ablations: 2008, 2009, 08/27/22 Anticoagulation history: Xarelto, Eliquis   Past Medical History:  Diagnosis Date   Atrial fibrillation (HCC)    CAD (coronary artery disease)    LAD stent in 1999   Hyperlipidemia    Hypertension    PAF (paroxysmal atrial fibrillation) (HCC)    cardioversion in 2013   SSS (sick sinus syndrome) (HCC)     ROS- All systems are reviewed and negative except as per the HPI above.  Physical Exam: Vitals:   01/29/23 0948  BP: (!) 144/76  Pulse: (!) 114  Weight: 106.8 kg  Height: 5\' 6"  (1.676 m)    GEN- The patient is well appearing, alert and oriented x 3 today.   Neck - no JVD or carotid bruit noted Lungs- Clear to ausculation bilaterally, normal work of breathing Heart- Tachycardic irregular rate and rhythm, no murmurs, rubs or gallops, PMI not laterally displaced Extremities- no clubbing, cyanosis, or edema Skin - no rash or ecchymosis noted   Wt Readings from Last 3 Encounters:  01/29/23 106.8 kg  01/19/23 108 kg  01/19/23 107.5 kg    EKG today demonstrates  Vent. rate 114 BPM PR interval * ms QRS duration 104 ms QT/QTcB 368/507 ms P-R-T axes * 118 -27 Atrial fibrillation with rapid ventricular response with premature ventricular or aberrantly conducted complexes Right axis deviation Low voltage QRS T wave abnormality, consider inferior ischemia Abnormal ECG When  compared with ECG of 22-Jan-2023 10:21, PREVIOUS ECG IS PRESENT   Echo 10/10/21 demonstrated   1. Left ventricular ejection fraction, by estimation, is 45-50% with beat  to beat variability. The left ventricle has mildly decreased function.  Left ventricular endocardial border not optimally defined to evaluate  regional wall  motion. There is mild left ventricular hypertrophy. Left ventricular diastolic parameters are indeterminate.   2. Right ventricular systolic function is mildly reduced. The right  ventricular size is normal. There is normal pulmonary artery systolic  pressure. The estimated right ventricular systolic pressure is 19.0 mmHg.   3. Left atrial size was mildly dilated.   4. The mitral valve is normal in structure. Mild mitral valve  regurgitation. No evidence of mitral stenosis.   5. The aortic valve is grossly normal. There is mild thickening of the  aortic valve. Aortic valve regurgitation is trivial. No aortic stenosis is  present.   6. The inferior vena cava is normal in size with greater than 50%  respiratory variability, suggesting right atrial pressure of 3 mmHg.   Comparison(s): A prior study was performed on 07/17/20. Prior images  reviewed side by side. LV function has decreased compared to prior exam.    Epic records are reviewed at length today  CHA2DS2-VASc Score = 3  The patient's score is based upon: CHF History: 0 HTN History: 1 Diabetes History: 0 Stroke History: 0 Vascular Disease History: 1 Age Score: 1 Gender Score: 0      ASSESSMENT AND PLAN: Persistent Atrial Fibrillation (ICD10:  I48.19) The patient's CHA2DS2-VASc score is 3, indicating a 3.2% annual risk of stroke.   S/p afib ablation 08/27/22. S/p Tikosyn admission 11/5-8/24.  S/p DCCV on 01/21/23.   He is currently in Afib with RVR. Begin diltiazem 30 mg TID PRN. Patient instructed to call us next week for update on rhythm. If he calls next week and still in Afib, will arrange cardioversion. Continue Tikosyn 125 mcg BID. Bmet and mag drawn today in addition to CBC. Continue K+ 10 meq daily.  Secondary Hypercoagulable State (ICD10:  D68.69) The patient is at significant risk for stroke/thromboembolism based upon his CHA2DS2-VASc Score of 3.  Continue Apixaban (Eliquis).   Obesity Body mass index is 37.99  kg/m.  Encouraged lifestyle modification    Patient will call clinic next week to update if converted to NSR or remained in Afib to help determine plan of care.    Justin Mend, PA-C Afib Clinic [facility] [address] [place], [place] [address] [phone] 01/29/2023 10:15 AM

## 2023-02-02 ENCOUNTER — Telehealth (HOSPITAL_COMMUNITY): Payer: Self-pay | Admitting: *Deleted

## 2023-02-02 NOTE — Telephone Encounter (Signed)
Pt reports he is still in afib, he is aware we will call him back to sch DCCV

## 2023-02-04 ENCOUNTER — Other Ambulatory Visit (HOSPITAL_COMMUNITY): Payer: Self-pay | Admitting: *Deleted

## 2023-02-04 DIAGNOSIS — I4819 Other persistent atrial fibrillation: Secondary | ICD-10-CM

## 2023-02-04 NOTE — Telephone Encounter (Signed)
Cardioversion scheduled for 11/25. No missed doses of eliquis. Continues in AF. Instructions reviewed with patient.

## 2023-02-05 NOTE — Progress Notes (Signed)
Pt called for pre procedure instructions. Arrival time 0715 NPO after midnight explained Instructed to take am meds with sip of water and confirmed blood thinner consistency Instructed pt need for ride home tomorrow and have responsible adult with them for 24 hrs post procedure.

## 2023-02-07 NOTE — Anesthesia Preprocedure Evaluation (Signed)
Anesthesia Evaluation  Patient identified by MRN, date of birth, ID band Patient awake    Reviewed: Allergy & Precautions, NPO status , Patient's Chart, lab work & pertinent test results, reviewed documented beta blocker date and time   Airway Mallampati: III  TM Distance: >3 FB Neck ROM: Full    Dental no notable dental hx. (+) Teeth Intact, Dental Advisory Given   Pulmonary former smoker   Pulmonary exam normal breath sounds clear to auscultation       Cardiovascular hypertension, Pt. on medications and Pt. on home beta blockers + CAD and + Cardiac Stents  Normal cardiovascular exam+ dysrhythmias (eliquis, no missed doses, s/p ablation) Atrial Fibrillation  Rhythm:Irregular Rate:Normal  TEE 08/2022  1. Left ventricular ejection fraction, by estimation, is 50 to 55%. The  left ventricle has low normal function. The left ventricle has no regional  wall motion abnormalities.   2. Right ventricular systolic function is mildly reduced. The right  ventricular size is normal.   3. Left atrial size was severely dilated. No left atrial/left atrial  appendage thrombus was detected. The LAA emptying velocity was 22 cm/s.   4. The mitral valve is normal in structure. Mild mitral valve  regurgitation. No evidence of mitral stenosis.   5. The aortic valve is tricuspid. Aortic valve regurgitation is trivial.  No aortic stenosis is present.   6. The inferior vena cava is normal in size with greater than 50%  respiratory variability, suggesting right atrial pressure of 3 mmHg     Neuro/Psych negative neurological ROS  negative psych ROS   GI/Hepatic negative GI ROS, Neg liver ROS,,,  Endo/Other  negative endocrine ROS    Renal/GU negative Renal ROS  negative genitourinary   Musculoskeletal negative musculoskeletal ROS (+)    Abdominal   Peds  Hematology negative hematology ROS (+)   Anesthesia Other Findings    Reproductive/Obstetrics                             Anesthesia Physical Anesthesia Plan  ASA: 3  Anesthesia Plan: General   Post-op Pain Management: Minimal or no pain anticipated   Induction: Intravenous  PONV Risk Score and Plan: 2 and Propofol infusion and Treatment may vary due to age or medical condition  Airway Management Planned: Natural Airway  Additional Equipment:   Intra-op Plan:   Post-operative Plan:   Informed Consent: I have reviewed the patients History and Physical, chart, labs and discussed the procedure including the risks, benefits and alternatives for the proposed anesthesia with the patient or authorized representative who has indicated his/her understanding and acceptance.     Dental advisory given  Plan Discussed with: CRNA  Anesthesia Plan Comments:        Anesthesia Quick Evaluation

## 2023-02-08 ENCOUNTER — Ambulatory Visit (HOSPITAL_COMMUNITY): Payer: Self-pay | Admitting: Anesthesiology

## 2023-02-08 ENCOUNTER — Ambulatory Visit (HOSPITAL_COMMUNITY)
Admission: RE | Admit: 2023-02-08 | Discharge: 2023-02-08 | Disposition: A | Discharge status: Home / Self Care | Payer: Medicare Other | Attending: Cardiology | Admitting: Cardiology

## 2023-02-08 ENCOUNTER — Encounter (HOSPITAL_COMMUNITY): Payer: Self-pay | Admitting: Cardiology

## 2023-02-08 ENCOUNTER — Encounter (HOSPITAL_COMMUNITY)
Admission: RE | Disposition: A | Discharge status: Home / Self Care | Payer: Self-pay | Source: Home / Self Care | Attending: Cardiology

## 2023-02-08 ENCOUNTER — Other Ambulatory Visit: Payer: Self-pay

## 2023-02-08 DIAGNOSIS — I4891 Unspecified atrial fibrillation: Secondary | ICD-10-CM | POA: Diagnosis not present

## 2023-02-08 DIAGNOSIS — E669 Obesity, unspecified: Secondary | ICD-10-CM | POA: Diagnosis not present

## 2023-02-08 DIAGNOSIS — Z79899 Other long term (current) drug therapy: Secondary | ICD-10-CM | POA: Insufficient documentation

## 2023-02-08 DIAGNOSIS — I2510 Atherosclerotic heart disease of native coronary artery without angina pectoris: Secondary | ICD-10-CM | POA: Diagnosis not present

## 2023-02-08 DIAGNOSIS — I4819 Other persistent atrial fibrillation: Secondary | ICD-10-CM | POA: Diagnosis not present

## 2023-02-08 DIAGNOSIS — G4733 Obstructive sleep apnea (adult) (pediatric): Secondary | ICD-10-CM | POA: Insufficient documentation

## 2023-02-08 DIAGNOSIS — I10 Essential (primary) hypertension: Secondary | ICD-10-CM | POA: Diagnosis not present

## 2023-02-08 DIAGNOSIS — Z6837 Body mass index (BMI) 37.0-37.9, adult: Secondary | ICD-10-CM | POA: Insufficient documentation

## 2023-02-08 DIAGNOSIS — Z955 Presence of coronary angioplasty implant and graft: Secondary | ICD-10-CM | POA: Diagnosis not present

## 2023-02-08 DIAGNOSIS — Z7901 Long term (current) use of anticoagulants: Secondary | ICD-10-CM | POA: Insufficient documentation

## 2023-02-08 DIAGNOSIS — E785 Hyperlipidemia, unspecified: Secondary | ICD-10-CM | POA: Insufficient documentation

## 2023-02-08 DIAGNOSIS — D6869 Other thrombophilia: Secondary | ICD-10-CM | POA: Diagnosis not present

## 2023-02-08 HISTORY — PX: CARDIOVERSION: EP1203

## 2023-02-08 SURGERY — CARDIOVERSION (CATH LAB)
Anesthesia: General

## 2023-02-08 MED ORDER — SODIUM CHLORIDE 0.9% FLUSH: 10.0000 mL | Freq: Two times a day (BID) | INTRAVENOUS | Status: DC

## 2023-02-08 MED ORDER — PROPOFOL 10 MG/ML IV BOLUS
INTRAVENOUS | Status: DC | PRN
  Administered 2023-02-08: 60 mg via INTRAVENOUS

## 2023-02-08 SURGICAL SUPPLY — 1 items: PAD DEFIB RADIO PHYSIO CONN (PAD) ×1 IMPLANT

## 2023-02-08 NOTE — Anesthesia Postprocedure Evaluation (Signed)
Anesthesia Post Note  Patient: JACOBIE AKINA  Procedure(s) Performed: CARDIOVERSION (CATH LAB)     Patient location during evaluation: Cath Lab Anesthesia Type: General Level of consciousness: awake and alert Pain management: pain level controlled Vital Signs Assessment: post-procedure vital signs reviewed and stable Respiratory status: spontaneous breathing, nonlabored ventilation, respiratory function stable and patient connected to nasal cannula oxygen Cardiovascular status: blood pressure returned to baseline and stable Postop Assessment: no apparent nausea or vomiting Anesthetic complications: no  No notable events documented.  Last Vitals:  Vitals:   02/08/23 0901 02/08/23 0911  BP: 123/74 124/79  Pulse: 60 (!) 58  Resp: 15 17  Temp:    SpO2: 95% 97%    Last Pain:  Vitals:   02/08/23 0911  TempSrc:   PainSc: 0-No pain                 Alyssandra Hulsebus L Marthena Whitmyer

## 2023-02-08 NOTE — Interval H&P Note (Signed)
History and Physical Interval Note:  02/08/2023 7:35 AM  Timothy [name]  has presented today for surgery, with the diagnosis of AFIB.  The various methods of treatment have been discussed with the patient and family. After consideration of risks, benefits and other options for treatment, the patient has consented to  Procedure(s): CARDIOVERSION (CATH LAB) (N/A) as a surgical intervention.  The patient's history has been reviewed, patient examined, no change in status, stable for surgery.  I have reviewed the patient's chart and labs.  Questions were answered to the patient's satisfaction.     Little Ishikawa

## 2023-02-08 NOTE — Transfer of Care (Signed)
Immediate Anesthesia Transfer of Care Note  Patient: PRUDENCIO KOLLER  Procedure(s) Performed: CARDIOVERSION (CATH LAB)  Patient Location: PACU  Anesthesia Type:General  Level of Consciousness: awake, alert , and oriented  Airway & Oxygen Therapy: Patient Spontanous Breathing and Patient connected to nasal cannula oxygen  Post-op Assessment: Report given to RN and Post -op Vital signs reviewed and stable  Post vital signs: Reviewed and stable  Last Vitals:  Vitals Value Taken Time  BP    Temp    Pulse    Resp    SpO2      Last Pain:  Vitals:   02/08/23 0722  TempSrc: Temporal  PainSc: 0-No pain         Complications: No notable events documented.

## 2023-02-08 NOTE — Discharge Instructions (Signed)
Electrical Cardioversion °Electrical cardioversion is the delivery of a jolt of electricity to restore a normal rhythm to the heart. A rhythm that is too fast or is not regular keeps the heart from pumping well. In this procedure, sticky patches or metal paddles are placed on the chest to deliver electricity to the heart from a device. °This procedure may be done in an emergency if: °There is low or no blood pressure as a result of the heart rhythm. °Normal rhythm must be restored as fast as possible to protect the brain and heart from further damage. °It may save a life. °This may also be a scheduled procedure for irregular or fast heart rhythms that are not immediately life-threatening. ° °What can I expect after the procedure? °Your blood pressure, heart rate, breathing rate, and blood oxygen level will be monitored until you leave the hospital or clinic. °Your heart rhythm will be watched to make sure it does not change. °You may have some redness on the skin where the shocks were given. Over the counter cortizone cream may be helpful.  °Follow these instructions at home: °Do not drive for 24 hours if you were given a sedative during your procedure. °Take over-the-counter and prescription medicines only as told by your health care provider. °Ask your health care provider how to check your pulse. Check it often. °Rest for 48 hours after the procedure or as told by your health care provider. °Avoid or limit your caffeine use as told by your health care provider. °Keep all follow-up visits as told by your health care provider. This is important. °Contact a health care provider if: °You feel like your heart is beating too quickly or your pulse is not regular. °You have a serious muscle cramp that does not go away. °Get help right away if: °You have discomfort in your chest. °You are dizzy or you feel faint. °You have trouble breathing or you are short of breath. °Your speech is slurred. °You have trouble moving an  arm or leg on one side of your body. °Your fingers or toes turn cold or blue. °Summary °Electrical cardioversion is the delivery of a jolt of electricity to restore a normal rhythm to the heart. °This procedure may be done right away in an emergency or may be a scheduled procedure if the condition is not an emergency. °Generally, this is a safe procedure. °After the procedure, check your pulse often as told by your health care provider. °This information is not intended to replace advice given to you by your health care provider. Make sure you discuss any questions you have with your health care provider. °Document Revised: 10/03/2018 Document Reviewed: 10/03/2018 °Elsevier Patient Education © 2020 [employer]  °

## 2023-02-08 NOTE — CV Procedure (Signed)
Procedure:   DCCV  Indication:  Symptomatic atrial fibrillation  Procedure Note:  The patient signed informed consent.  They have had had therapeutic anticoagulation with Eliquis greater than 3 weeks.  Anesthesia was administered by Dr. Armond Hang and Georgianne Fick, CRNA.  Adequate airway was maintained throughout and vital followed per protocol.  They were cardioverted x 1 with 200J of biphasic synchronized energy.  They converted to NSR with rate 50s.  There were no apparent complications.  The patient had normal neuro status and respiratory status post procedure with vitals stable as recorded elsewhere.    Follow up:  They will continue on current medical therapy and follow up with cardiology as scheduled.  Epifanio Lesches, MD 02/08/2023 8:50 AM

## 2023-02-15 ENCOUNTER — Other Ambulatory Visit: Payer: Self-pay | Admitting: Cardiovascular Disease

## 2023-02-15 ENCOUNTER — Other Ambulatory Visit: Payer: Self-pay | Admitting: Cardiology

## 2023-02-15 DIAGNOSIS — I4891 Unspecified atrial fibrillation: Secondary | ICD-10-CM

## 2023-02-16 ENCOUNTER — Other Ambulatory Visit (HOSPITAL_COMMUNITY): Payer: Self-pay

## 2023-02-16 ENCOUNTER — Other Ambulatory Visit: Payer: Self-pay

## 2023-02-18 ENCOUNTER — Encounter: Payer: Self-pay | Admitting: Cardiovascular Disease

## 2023-02-18 ENCOUNTER — Telehealth: Payer: Self-pay | Admitting: Cardiovascular Disease

## 2023-02-18 ENCOUNTER — Ambulatory Visit: Payer: Medicare Other | Attending: Cardiovascular Disease | Admitting: Cardiovascular Disease

## 2023-02-18 VITALS — BP 118/82 | HR 98 | Ht 66.0 in | Wt 235.0 lb

## 2023-02-18 DIAGNOSIS — I10 Essential (primary) hypertension: Secondary | ICD-10-CM | POA: Diagnosis not present

## 2023-02-18 DIAGNOSIS — D6869 Other thrombophilia: Secondary | ICD-10-CM | POA: Diagnosis not present

## 2023-02-18 DIAGNOSIS — G4733 Obstructive sleep apnea (adult) (pediatric): Secondary | ICD-10-CM | POA: Insufficient documentation

## 2023-02-18 DIAGNOSIS — I2510 Atherosclerotic heart disease of native coronary artery without angina pectoris: Secondary | ICD-10-CM | POA: Diagnosis not present

## 2023-02-18 DIAGNOSIS — G4737 Central sleep apnea in conditions classified elsewhere: Secondary | ICD-10-CM | POA: Diagnosis not present

## 2023-02-18 DIAGNOSIS — E669 Obesity, unspecified: Secondary | ICD-10-CM | POA: Insufficient documentation

## 2023-02-18 DIAGNOSIS — Z79899 Other long term (current) drug therapy: Secondary | ICD-10-CM | POA: Insufficient documentation

## 2023-02-18 DIAGNOSIS — E785 Hyperlipidemia, unspecified: Secondary | ICD-10-CM | POA: Diagnosis not present

## 2023-02-18 DIAGNOSIS — I4891 Unspecified atrial fibrillation: Secondary | ICD-10-CM | POA: Diagnosis not present

## 2023-02-18 DIAGNOSIS — I4819 Other persistent atrial fibrillation: Secondary | ICD-10-CM | POA: Insufficient documentation

## 2023-02-18 NOTE — Telephone Encounter (Signed)
*  STAT* If patient is at the pharmacy, call can be transferred to refill team.   1. Which medications need to be refilled? (please list name of each medication and dose if known)  diltiazem (CARDIZEM) 30 MG tablet potassium chloride (KLOR-CON M) 10 MEQ tablet  2. Which pharmacy/location (including street and city if local pharmacy) is medication to be sent to? [facility] - [address] [place] - [address]  3. Do they need a 30 day or 90 day supply?  30 day supply

## 2023-02-18 NOTE — Telephone Encounter (Signed)
Eliquis 5mg  refill request received. Patient is 77 years old, weight-106.6kg, Crea-1.35 on 01/29/23, Diagnosis-Afib, and last seen by Dr. Tresa Endo on 02/18/23. Dose is appropriate based on dosing criteria. Will send in refill to requested pharmacy.

## 2023-02-18 NOTE — Progress Notes (Signed)
Patient ID: Timothy [name], male   DOB: [birth year], 77 y.o.   MRN: [id number]     HPI:  Timothy [name] is a 77 year old male who is a former patient of Dr.Richard Alanda Amass. 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  He has established CAD and in 1999 underwent a self-expanding radius stent to his LAD. His last nuclear perfusion study was in May 2013 which was low risk and without ischemia an echo Doppler study showed an ejection fraction in the 55-60% range. He had mild LA dilatation. The patient has a history of sick sinus syndrome with paroxysmal atrial fibrillation and remotely had been on amiodarone but developed skin toxicity secondary to do that secondary to this. Apparently he has undergone 2 radiofrequency ablations at [facility] by Dr. Sampson Goon initially in February 2009 and he required a second procedure in December 2009 for recurrent atrial fibrillation. He had developed a recurrent episode of atrial fibrillation several years ago at which time he underwent cardioversion and has been maintaining sinus rhythm with a rate coagulation therapy.  He last saw Dr. Alanda Amass in March 2014 at which time he was felt to be stable on his current medical regimen which consisted of Lopressor 25 twice a day, multaq 400 twice a day,  Xarelto 20 mg daily in addition to his atorvastatin 20 mg and fish oil for hyperlipidemia.   In 2015 he underwent colonoscopy.  He held his Xarelto for the procedure.  He was found of 6 polyps and will require repeat colonoscopy in 3 years.  When I saw him in 2017 he had experienced one episode of atrial fibrillation several weeks prior.  This occurred after working to significant exhaustion when he was straining, lifting sheet rock and remodeling a bathroom.     I saw him in November 2018 at which time he denied  any awareness of recurrent palpitations or atrial fibrillation.  He is retired from [employer] and has a part-time job as a Orthoptist.  However, he states he often is working at least 50 hours per week in this "part-time "position.  At that time he was on atorvastatin for hyperlipidemia, lisinopril 5 mg and metoprolol tartrate 25 mg twice a day for hypertension and PAF in addition to Xarelto anticoagulation.  Laboratory by Dr. Lucila Maine.  on 11/27/2016:  total cholesterol was 142, HDL 44, LDL 79, triglycerides 94.  Renal function was stable with a creatinine of 0.97.  LFTs were normal.  TSH was 1.3.    I saw him in January 2020 at which time he had  remained stable.  However  2 weeks prior to that evaluation he began to notice some URI type symptoms. While getting ready to do a church service he felt his heart become irregular and he believes he was in atrial fibrillation for 28 hours duration with a rate averaging around 110 bpm.  He did not take any extra metoprolol.  Ultimately he reverted back to sinus rhythm.  He later saw his primary physician and was diagnosed with bronchitis and received antibiotic therapy in addition to Spiriva and cough medication.  When I saw him, he denied any recurrent episode of atrial fibrillation since that episode and he denied any chest pressure.  He continued to be on Xarelto.  When I saw him in November  2020 he admitted that he continued to be stable and he denied any episodes of chest pain, exertional dyspnea or recurrent arrhythmia.  He continued to be on lisinopril 10 mg daily and metoprolol 25 mg twice a day in addition to Xarelto 20 mg daily.  He was on atorvastatin 40 mg for hyperlipidemia and continues to take over-the-counter fish oil.  He is on aspirin.  He was walking at least 5 days/week.    I saw him in December 2021.  He continued to work as a Orthoptist at [employer].   He states his hours are 50 to 60 hours/week.  At that time he was having a significant cough which I felt was most likely due to lisinopril and as result I discontinued ACE inhibition and initiated valsartan 80 mg daily.    I saw him in June 17, 2020.  His cough had improved with his medication adjustment.  He has not been very successful with weight loss.  At that time he was having  difficulty with sleeping.  He typically goes to bed between 9:30 and 10 pm and wakes up at approximately 6:30 AM.  He has nocturia 1 time per night and he does snore.  Last week he believes he had gone back into atrial fibrillation on Thursday evening and ultimately converted back to sinus rhythm after approximately 3 days on Monday.  He has experienced some ankle swelling when he has gone into atrial fibrillation.  He continued to be on Xarelto.  During that evaluation, his ECG showed sinus rhythm at 64 bpm with isolated PVC.  I was concerned that he may have obstructive sleep apnea which may be potentially contributing to his recurrent atrial fibrillation episode.  I recommended he undergo a follow-up echo Doppler study and also recommended a sleep evaluation.  On Jul 17, 2020 an echo Doppler study showed normal systolic function with EF at 60 to 65% with mild concentric LVH, and grade 2 diastolic dysfunction.  This was not significantly changed from his prior echo in January 2020.  He underwent a sleep study on August 14, 2020 and met split-night criteria.  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  CPAP was initiated at 6 cm and was titrated to optimal pressure at 16 cm of water.  He did have occasional PVCs during the study.  I saw him on October 07, 2020.  At that time, he had not yet been set up with CPAP due to supply chain issues resulting in at least a 31-month delay.  He is  going to bed between 10 and 10:30 at night and waking up at 630.  He denies chest pain.  He continues to be on valsartan 80 mg and metoprolol titrate 50 mg in the morning and 25 mg in the evening for hypertension and his PAF.  He is on atorvastatin 40 mg daily for hyperlipidemia.  He continues to take Xarelto for anticoagulation and is on Nexium for GERD.    I saw him on March 18, 2021.  Since his prior evaluation he had a brief episode of atrial fibrillation the week before Thanksgiving.  He is now taking metoprolol tartrate 50 mg twice a day in addition to his valsartan 80 mg daily for blood pressure.  He is on Xarelto for anticoagulation.  He continues to be  on Lipitor 40 mg for hyperlipidemia and Nexium for GERD.  He receives his CPAP machine on March 06, 2021 and has been on treatment for 11 days.  Presently he is set at a set pressure of 16 cm.  His average use is 6 hours and 36 minutes.  AHI is elevated at 11.3 and he had increased central events at 9.7/h.  He has been using a full facemask.  During that evaluation, I made some adjustments to his CPAP machine and increased his ramp start pressure to 6 and changed him from a fixed set pressure of 16 to an auto mode pressure range of 10 to 18 cm of water.  I saw him on May 06, 2021.  Since his prior evaluation he felt improved with CPAP therapy.  A download was obtained from January 23 through February 21.  During this time he had a stomach virus and was afraid of throwing up and did not use CPAP for slightly over a weeks duration.  He has resumed CPAP since.  His initial mask was torn and he has now been using the full facemask from his original sleep study.  He typically goes to bed between 930 and 10 and wakes up at 6:30 AM.  His most recent download at his pressure range of 10 to 18 reveals an AHI of 9.5.  His 95th percentile pressure is 12.5 with maximum average pressure 13.5.  He is having both apneic as well as central events with a central  index of 6.8.  He denies any chest pain or significant shortness of breath.  During that evaluation, we discussed if he continued to have central events he would need to be switched to possible BiPAP therapy.  His blood pressure was stable.  He was on anticoagulation with his PAF history.  I last saw him on June 22, 2022.  He had been scheduled for an ablation with Dr. 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  His Xarelto was changed to Eliquis.  He is tentatively re-scheduled to undergo this ablation with Dr. Elberta Fortis on August 27, 2022.  When I saw him he noted swelling slightly more prominent in his right lower extremity compared to his left.  He is planning to retire in July.  He has been on a medical regimen of Eliquis 5 mg twice a day, aspirin 81 mg, diltiazem 180 mg, metoprolol tartrate 100 mg twice a day in addition to fish oil 1000 mg.  He denies any chest tightness or pressure.  He continues to use CPAP and will need to be set up with a new DME provider since [facility] care.  A download was obtained from March 7 through June 19, 2022.  He is compliant with usage days 90% and usage greater than 4 hours at 87%.  Average use is 7 hours and 5 minutes.  At his current pressure setting with his AirSense 11 AutoSet unit at a range of 11 to 18 cm, AHI is mildly increased at 7.2 of which 5.7 are central events.  The days he did not use CPAP therapy was when he was slept in the hospital with his wife who was hospitalized at that time.  During my evaluation I changed his pressure setting to a range of 9 to 16 cm of water and if continued central events occurred future BiPAP or ASV titration may be necessary.  I suspected some of his edema  may have been contributed to diltiazem and recommended the addition of furosemide 20 mg.  Timothy [name] underwent A-fib ablation on August 27, 2022 by Dr. Elberta Fortis.  Unfortunately, atrial fibrillation  recurred 3 days later.  He has had subsequent reevaluations for his A-fib, was started on Tikosyn and underwent cardioversion on January 21, 2023 with restoration of sinus rhythm.  Due to prolonged QT, Tikosyn dose was reduced to 125 mcg twice a day unfortunately again, he developed recurrent atrial fibrillation on November 13.  He underwent repeat cardioversion with 200 J of biphasic synchronized energy by Dr. Bjorn Pippin on February 08, 2023.  He again developed recurrent atrial fibrillation on February 11, 2023 he presents for evaluation.   Past Medical History:  Diagnosis Date   Atrial fibrillation (HCC)    CAD (coronary artery disease)    LAD stent in 1999   Hyperlipidemia    Hypertension    PAF (paroxysmal atrial fibrillation) (HCC)    cardioversion in 2013   SSS (sick sinus syndrome) (HCC)     Past Surgical History:  Procedure Laterality Date   ATRIAL FIBRILLATION ABLATION N/A 02/20/2022   Procedure: ATRIAL FIBRILLATION ABLATION;  Surgeon: Regan Lemming, MD;  Location: [facility] INVASIVE CV LAB;  Service: Cardiovascular;  Laterality: N/A;   ATRIAL FIBRILLATION ABLATION N/A 08/27/2022   Procedure: ATRIAL FIBRILLATION ABLATION;  Surgeon: Regan Lemming, MD;  Location: [facility] INVASIVE CV LAB;  Service: Cardiovascular;  Laterality: N/A;   CARDIAC ELECTROPHYSIOLOGY STUDY AND ABLATION  2008, 2009   RF ablation (x4) [facility] - Dr. Sampson Goon)   CARDIOVERSION  06/15/2011   Procedure: CARDIOVERSION;  Surgeon: Chrystie Nose, MD;  Location: [facility] OR;  Service: Cardiovascular;  Laterality: N/A;   CARDIOVERSION N/A 09/15/2021   Procedure: CARDIOVERSION;  Surgeon: Jake Bathe, MD;  Location: [facility] ENDOSCOPY;  Service: Cardiovascular;  Laterality: N/A;   CARDIOVERSION N/A 01/21/2023   Procedure: CARDIOVERSION (CATH LAB);  Surgeon: Tessa Lerner, DO;  Location: [facility] INVASIVE CV LAB;  Service: Cardiovascular;  Laterality: N/A;   CARDIOVERSION N/A 02/08/2023   Procedure: CARDIOVERSION (CATH LAB);   Surgeon: Little Ishikawa, MD;  Location: [facility] INVASIVE CV LAB;  Service: Cardiovascular;  Laterality: N/A;   CORONARY ANGIOPLASTY WITH STENT PLACEMENT  10/29/1997   .0x20 self-expanding radius stent to LAD (Dr. Jonette Eva)   NM [facility] PERF WALL MOTION  07/2011   bruce myoview; partially fixed apical defect, worse at rest than stress; fixed inferobasal diaphragmatic attenuation artifact; no reversible ischemia; post-stress EF 60%; short run of NSVT; low risk scan    TEE WITHOUT CARDIOVERSION N/A 04/03/2022   Procedure: TRANSESOPHAGEAL ECHOCARDIOGRAM (TEE);  Surgeon: Parke Poisson, MD;  Location: [facility] ENDOSCOPY;  Service: Cardiology;  Laterality: N/A;   TEE WITHOUT CARDIOVERSION N/A 02/20/2022   Procedure: TRANSESOPHAGEAL ECHOCARDIOGRAM (TEE);  Surgeon: Regan Lemming, MD;  Location: [facility] INVASIVE CV LAB;  Service: Cardiovascular;  Laterality: N/A;   TEE WITHOUT CARDIOVERSION N/A 08/20/2022   Procedure: TRANSESOPHAGEAL ECHOCARDIOGRAM (TEE);  Surgeon: Jake Bathe, MD;  Location: [facility] INVASIVE CV LAB;  Service: Cardiovascular;  Laterality: N/A;   TRANSTHORACIC ECHOCARDIOGRAM  06/13/2011   EF 55-60%, mod conc LVH; LA mildly dilated    Allergies  Allergen Reactions   Nitroglycerin Other (See Comments)    "Blood Pressure goes to zero"    Current Outpatient Medications  Medication Sig Dispense Refill   aspirin 81 MG chewable tablet Chew 81 mg by mouth in the morning.     atorvastatin (LIPITOR) 40 MG  tablet TAKE ONE TABLET BY MOUTH EVERYDAY AT BEDTIME 90 tablet 3   esomeprazole (NEXIUM) 20 MG capsule Take 20 mg by mouth daily before breakfast.     furosemide (LASIX) 20 MG tablet Take 1 tablet (20 mg total) by mouth daily. 90 tablet 3   GARLIC PO Take 1 capsule by mouth daily.     loratadine (CLARITIN) 10 MG tablet Take 10 mg by mouth daily as needed for allergies.     metoprolol tartrate (LOPRESSOR) 100 MG tablet TAKE ONE TABLET BY MOUTH TWICE DAILY 180 tablet 3   Omega-3 Fatty Acids  (FISH OIL) 1000 MG CAPS Take 1,000 mg by mouth daily.     VITAMIN D PO Take 1 capsule by mouth daily.     apixaban (ELIQUIS) 5 MG TABS tablet TAKE ONE (1) TABLET BY MOUTH TWICE DAILY 180 tablet 1   diltiazem (CARDIZEM CD) 180 MG 24 hr capsule TAKE 1 CAPSULE BY MOUTH ONCE DAILY 90 capsule 3   diltiazem (CARDIZEM) 30 MG tablet Take 1 tablet (30 mg total) by mouth every 4 (four) hours as needed. Take 1 tablet every 4 hours AS NEEDED for AFIB heart rate >100 as long as top BP >100. 90 tablet 3   potassium chloride (KLOR-CON M) 10 MEQ tablet Take 1 tablet (10 mEq total) by mouth daily. 90 tablet 3   No current facility-administered medications for this visit.    Social history is notable in that he is married for 48 years. He has 3 children 10 grandchildren. He is retired from Nature conservation officer business.  Family History  Problem Relation Age of Onset   Coronary artery disease Father        MI   Hypertension Father    Diabetes Mother    Hypertension Sister    Atrial fibrillation Child    Hypertension Child    ROS General: Negative; No fevers, chills, or night sweats; positive for weight gain HEENT: Remote history of photosensitivity when he was on amiodarone; No changes in vision or hearing, sinus congestion, difficulty swallowing Pulmonary: Recent bronchitis with mild wheezing Cardiovascular: See history of present illness; No chest pain, presyncope, syncope, palpitations GI: Negative; No nausea, vomiting, diarrhea, or abdominal pain GU: Negative; No dysuria, hematuria, or difficulty voiding Musculoskeletal: Negative; no myalgias, joint pain, or weakness Hematologic/Oncology: Negative; no easy bruising, bleeding Endocrine: Negative; no heat/cold intolerance; no diabetes Neuro: Negative; no changes in balance, headaches Skin: Negative; No rashes or skin lesions Psychiatric: Negative; No behavioral problems, depression Sleep: Positive for snoring, nodaytime sleepiness, hypersomnolence; he  received a 6 ResMed air sense 11 AutoSet CPAP unit on March 06, 2021.  No Bruxism, restless legs, hypnogognic hallucinations, no cataplexy Other comprehensive 14 point system review is negative.   PE BP 118/82   Pulse 98   Ht 5\' 6"  (1.676 m)   Wt 235 lb (106.6 kg)   SpO2 96%   BMI 37.93 kg/m    Repeat blood pressure by me 128/82  Wt Readings from Last 3 Encounters:  02/19/23 236 lb 3.2 oz (107.1 kg)  02/18/23 235 lb (106.6 kg)  02/08/23 230 lb (104.3 kg)   General: Alert, oriented, no distress.  Skin: normal turgor, no rashes, warm and dry HEENT: Normocephalic, atraumatic. Pupils equal round and reactive to light; sclera anicteric; extraocular muscles intact;  Nose without nasal septal hypertrophy Mouth/Parynx benign; Mallinpatti scale 4 Neck: No JVD, no carotid bruits; normal carotid upstroke Lungs: clear to ausculatation and percussion; no wheezing or rales Chest wall:  without tenderness to palpitation Heart: PMI not displaced, RRR, s1 s2 normal, 1/6 systolic murmur, no diastolic murmur, no rubs, gallops, thrills, or heaves Abdomen: soft, nontender; no hepatosplenomehaly, BS+; abdominal aorta nontender and not dilated by palpation. Back: no CVA tenderness Pulses 2+ Musculoskeletal: full range of motion, normal strength, no joint deformities Extremities: Trace ankle edema, no clubbing cyanosis or edema, Homan's sign negative  Neurologic: grossly nonfocal; Cranial nerves grossly wnl Psychologic: Normal mood and affect  EKG Interpretation Date/Time:  Thursday February 18 2023 10:43:51 EST Ventricular Rate:  98 PR Interval:    QRS Duration:  104 QT Interval:  382 QTC Calculation: 487 R Axis:   15  Text Interpretation: Atrial fibrillation with premature ventricular or aberrantly conducted complexes Low voltage QRS When compared with ECG of 08-Feb-2023 08:51, Atrial fibrillation has replaced Sinus rhythm Vent. rate has increased BY  44 BPM QT has lengthened Confirmed by  Nicki Guadalajara ([id number]) on 02/21/2023 11:11:11 AM      June 22, 2022 ECG (independently read by me): Atrial fibrillation at 86, right axis, T wave abnormality  May 06, 2021 ECG (independently read by me): SInus rhythm at 69, PVCs, nonspecific T wave abnormality  March 18, 2021 ECG (independently read by me):  Sinus bradycardia at 49, no ectopy  October 07, 2020 ECG (independently read by me): Sinus Bradycardia at 57; no ectopy, normal intervals  April 2022 ECG (independently read by me): Normal sinus rhythm at 64 bpm, PVC, QTc interval 449 ms.  December 2021 ECG (independently read by me): NSR at 62, normal intervals  January 24, 2019 ECG (independently read by me): Sinus bradycardia 55 bpm.  No ectopy.  Normal intervals.  January 2020 ECG (independently read by me): Sinus bradycardia 55 bpm.  Normal intervals.  No ectopy.  November 2018 ECG (independently read by me): Sinus bradycardia 52 bpm.  Normal intervals.  No ST segment changes.  October 2017 ECG (independently read by me): Sinus rhythm at 59 bpm.  No significant ST-T changes.  QTc interval 461 ms.  August 2015 ECG (independently read by me): Sinus bradycardia 57 beats per minute.  QTc interval 455 ms.  January  2015 ECG (independently read by me):  Sinus bradycardia 54 beats per minute. Normal intervals. No ectopy.  Prior ECG of 01/05/2013: normal sinus rhythm at 62 beats per minute; PR interval 146 ms; QTc interval 468 ms.   LABS:    Latest Ref Rng & Units 01/29/2023   10:30 AM 01/22/2023    3:58 AM 01/21/2023    5:03 AM  BMP  Glucose 70 - 99 mg/dL 604  540  981   BUN 8 - 23 mg/dL 12  15  14    Creatinine 0.61 - 1.24 mg/dL 1.91  4.78  2.95   Sodium 135 - 145 mmol/L 138  139  139   Potassium 3.5 - 5.1 mmol/L 4.1  4.0  4.3   Chloride 98 - 111 mmol/L 109  108  108   CO2 22 - 32 mmol/L 22  25  25    Calcium 8.9 - 10.3 mg/dL 8.9  8.6  8.6       Latest Ref Rng & Units 12/31/2022   10:46 AM 03/18/2021   12:25 PM  04/03/2020    8:41 AM  Hepatic Function  Total Protein 6.0 - 8.5 g/dL 6.2  6.3  6.6   Albumin 3.8 - 4.8 g/dL 4.3  4.3  4.3   AST 0 - 40 IU/L 22  45  30   ALT 0 - 44 IU/L 15  59  44   Alk Phosphatase 44 - 121 IU/L 48  63  55   Total Bilirubin 0.0 - 1.2 mg/dL 0.5  1.0  0.6   Bilirubin, Direct 0.00 - 0.40 mg/dL   1.61       Latest Ref Rng & Units 01/29/2023   10:30 AM 12/31/2022   10:46 AM 08/11/2022    8:45 AM  CBC  WBC 4.0 - 10.5 K/uL 8.1  5.7  7.1   Hemoglobin 13.0 - 17.0 g/dL 09.6  04.5  40.9   Hematocrit 39.0 - 52.0 % 47.2  38.3  43.5   Platelets 150 - 400 K/uL 233  222  220    Lab Results  Component Value Date   MCV 92.9 01/29/2023   MCV 100 (H) 12/31/2022   MCV 91.8 08/11/2022   Lab Results  Component Value Date   TSH 4.740 (H) 12/31/2022   Lipid Panel     Component Value Date/Time   CHOL 134 03/18/2021 1225   TRIG 114 03/18/2021 1225   HDL 44 03/18/2021 1225   CHOLHDL 3.0 03/18/2021 1225   CHOLHDL 2.9 11/22/2014 0856   VLDL 29 11/22/2014 0856   LDLCALC 69 03/18/2021 1225   RADIOLOGY: No results found.  IMPRESSION: 1. Persistent atrial fibrillation (HCC)   2. Coronary artery disease involving native coronary artery of native heart without angina pectoris   3. OSA (obstructive sleep apnea)   4. 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 Essential hypertension   6. Secondary hypercoagulable state (HCC)   7. Hyperlipidemia LDL goal <70   8. Medication management   9. Moderate obesity     ASSESSMENT AND PLAN: Timothy [name] is a 77 year-old white male who has established CAD and underwent insertion of a self-expanding Radius stent to his LAD in 1999.  His last nuclear study in May 2013 was low risk without ischemia.  He has a history of sick sinus syndrome with paroxysmal atrial fibrillation and developed amiodarone skin toxicity.  He is status post 2 radiofrequency ablations. When I saw him in January 2020 he had experienced one episode  of recurrent atrial fibrillation.  He had been without any recurrent episodes of atrial fibrillation for greater than 2 years but experienced an episode of AF which he believes lasted from 11 AM on a Sunday until 3 PM on Monday with an average ventricular rate at 110 bpm and  again experienced an episode of atrial fibrillation in the evening on Thursday night and ultimately converted back to sinus rhythm 3 days later at the end of March/early April 2022.  When I saw him on June 17, 2020 I recommended titration of his metoprolol dose to 50 mg in the morning and 25 mg at night.   Due to concerns for obstructive sleep apnea potentially contributing to his PAF, he underwent a sleep study. On the diagnostic portion of the study, he was unable to achieve any REM sleep.  He had overall mild to moderate sleep apnea but his RDI index was significantly increased in excess of 90.  He was titrated up to 16 cm water pressure with excellent response.  Unfortunately due to supply chain issues he was unable to receive his CPAP machine until March 06, 2021.  Of note, the week before Thanksgiving when he was still not on treatment, he again had an episode of recurrent AF and since that time has increased  his metoprolol to 50 mg twice a day.  When evaluated on March 18, 2021 he was maintaining sinus rhythm with bradycardia.  He developed recurrent atrial fibrillation in June 2023 and had cardioversion October 05, 2021.  He has had 3 unsuccessful attempts at cardioversion subsequently and was scheduled for ablation on February 20, 2022 with Dr. 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  Thrombus was still present on April 03, 2022 although smaller.  He is now on Eliquis instead of Xarelto for anticoagulation and underwent repeat  ablation on August 27, 2022 with Dr. Elberta Fortis.  Unfortunately, he has had recurrent atrial fibrillation despite his most recent ablation and has  undergone Tikosyn loading with ultimate subsequent cardioversion.  Due to prolonged QTc interval, Tikosyn dose had to be reduced to 125 mcg twice a day and despite Tikosyn loading he again has had several recurrent episodes of atrial fibrillation and despite his most recent cardioversion on February 08, 2023 he has been back in atrial fibrillation since November 28.  Presently, I have recommended resumption of Cardizem CD 180 mg instead of the 30 mg as needed dosing.  He continues to be on metoprolol tartrate 100 mg twice a day, dofetilide 125 mcg twice a day and is anticoagulated on Eliquis.  He has not been using his CPAP consistently.  I was able to obtain a download from November for through February 16, 2023 which shows 53 days of usage.  Average use is 6 hours and 10 minutes but only 43% of days was greater than 4 hours.  Despite his pressure range of 9 to 16 cm of water, AHI remains elevated at 13.5 of which 10.8 are due to central events.  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  We will try to expedite the scheduling but I understand there is a back log of scheduled cases.  On his most recent TEE evaluation in June 2024, EF was 50 to 55%.  He has a follow-up A-fib clinic appointment later this week.    Lennette Bihari, MD, [facility], ABSM Diplomate, American Board of Sleep Medicine  02/21/2023 11:24 AM

## 2023-02-18 NOTE — Patient Instructions (Addendum)
Medication Instructions:  *If you need a refill on your cardiac medications before your next appointment, please call your pharmacy*   Lab Work: If you have labs (blood work) drawn today and your tests are completely normal, you will receive your results only by: MyChart Message (if you have MyChart) OR A paper copy in the mail If you have any lab test that is abnormal or we need to change your treatment, we will call you to review the results.   Testing/Procedures: Your physician has recommended that you have a sleep study. This test records several body functions during sleep, including: brain activity, eye movement, oxygen and carbon dioxide blood levels, heart rate and rhythm, breathing rate and rhythm, the flow of air through your mouth and nose, snoring, body muscle movements, and chest and belly movement.    Follow-Up: At [facility], you and your health needs are our priority.  As part of our continuing mission to provide you with exceptional heart care, we have created designated Provider Care Teams.  These Care Teams include your primary Cardiologist (physician) and Advanced Practice Providers (APPs -  Physician Assistants and Nurse Practitioners) who all work together to provide you with the care you need, when you need it.  We recommend signing up for the patient portal called "MyChart".  Sign up information is provided on this After Visit Summary.  MyChart is used to connect with patients for Virtual Visits (Telemedicine).  Patients are able to view lab/test results, encounter notes, upcoming appointments, etc.  Non-urgent messages can be sent to your provider as well.   To learn more about what you can do with MyChart, go to [email or web address].    Your next appointment:   4 month(s)  Provider:   Nicki Guadalajara, MD     Other Instructions If you have any questions or concerns regarding your c-pap, bi-pap or sleep accessories, please contact Brandie Rorie at  [phone].

## 2023-02-19 ENCOUNTER — Other Ambulatory Visit: Payer: Self-pay

## 2023-02-19 ENCOUNTER — Ambulatory Visit (HOSPITAL_COMMUNITY)
Admission: RE | Admit: 2023-02-19 | Discharge: 2023-02-19 | Disposition: A | Discharge status: Home / Self Care | Payer: Medicare Other | Source: Ambulatory Visit | Attending: Internal Medicine | Admitting: Internal Medicine

## 2023-02-19 ENCOUNTER — Encounter (HOSPITAL_COMMUNITY): Payer: Self-pay | Admitting: Internal Medicine

## 2023-02-19 VITALS — BP 118/82 | HR 88 | Ht 66.0 in | Wt 236.2 lb

## 2023-02-19 DIAGNOSIS — Z86718 Personal history of other venous thrombosis and embolism: Secondary | ICD-10-CM | POA: Diagnosis not present

## 2023-02-19 DIAGNOSIS — Z7901 Long term (current) use of anticoagulants: Secondary | ICD-10-CM | POA: Diagnosis not present

## 2023-02-19 DIAGNOSIS — I2510 Atherosclerotic heart disease of native coronary artery without angina pectoris: Secondary | ICD-10-CM | POA: Insufficient documentation

## 2023-02-19 DIAGNOSIS — Z79899 Other long term (current) drug therapy: Secondary | ICD-10-CM | POA: Diagnosis not present

## 2023-02-19 DIAGNOSIS — E785 Hyperlipidemia, unspecified: Secondary | ICD-10-CM | POA: Diagnosis not present

## 2023-02-19 DIAGNOSIS — E669 Obesity, unspecified: Secondary | ICD-10-CM | POA: Diagnosis not present

## 2023-02-19 DIAGNOSIS — Z6838 Body mass index (BMI) 38.0-38.9, adult: Secondary | ICD-10-CM | POA: Diagnosis not present

## 2023-02-19 DIAGNOSIS — I4819 Other persistent atrial fibrillation: Secondary | ICD-10-CM | POA: Insufficient documentation

## 2023-02-19 DIAGNOSIS — D6869 Other thrombophilia: Secondary | ICD-10-CM | POA: Diagnosis not present

## 2023-02-19 DIAGNOSIS — I10 Essential (primary) hypertension: Secondary | ICD-10-CM | POA: Diagnosis not present

## 2023-02-19 MED ORDER — POTASSIUM CHLORIDE CRYS ER 10 MEQ PO TBCR: 10.0000 meq | EXTENDED_RELEASE_TABLET | Freq: Every day | ORAL | 3 refills | Status: DC

## 2023-02-19 MED ORDER — DILTIAZEM HCL 30 MG PO TABS: 30.0000 mg | ORAL_TABLET | ORAL | 3 refills | Status: DC | PRN

## 2023-02-19 NOTE — Patient Instructions (Addendum)
STOP Dofetilide (Tikosyn) - do not restart

## 2023-02-19 NOTE — Progress Notes (Signed)
Primary Care Physician: Street, Stephanie Coup, MD Primary Cardiologist: Dr Tresa Endo Primary Electrophysiologist: Dr Elberta Fortis  Referring Physician: Dr Lucky Rathke is a 77 y.o. male with a history of CAD, HLD, HTN, OSA, atrial fibrillation who presents for follow up in the [facility]. He was previously on amiodarone but developed skin toxicity. He had ablation x2 at [facility] by Dr. Sampson Goon in 2009. He has obstructive sleep apnea. June 2023 was found to be in atrial fibrillation. He had a cardioversion 10/05/2021. At that time, he had 3 unsuccessful attempts at cardioversion. Patient is on Eliquis for a CHADS2VASC score of 3. Patient was scheduled for ablation with Dr Elberta Fortis but the pre ablation CT and TEE showed LA thrombus and the surgery was cancelled. His Xarelto was changed to Eliquis. TEE on 04/03/22 showed persistent but smaller thrombus.   Patient is now s/p afib ablation with Dr Elberta Fortis on 08/27/22.   On follow up today, patient reports that he continues to have fatigue and SOB with exertion. He remains in afib. No bleeding issues on anticoagulation.   On follow up 01/19/23, patient is here for Tikosyn admission. No new medications since last OV. No benadryl use. No missed doses of anticoagulant.   On follow up 01/29/23, he is currently in Afib with RVR. S/p Tikosyn admission 11/5-8/24. S/p successful DCCV on 01/21/23 x 2 shocks with 360J. QT prolonged and discharged on Tikosyn 125 mcg BID. Discharged on K+ 10 meq daily. He went out of rhythm yesterday at 1515.   On follow up 02/19/23, he is currently in Afib. S/p successful DCCV on 02/08/23. He is on Tikosyn 125 mcg BID. He feels tired when in Afib and SOB when he exerts himself. No missed doses of Tikosyn or Eliquis.  Today, he denies symptoms of palpitations, chest pain, orthopnea, PND, lower extremity edema, dizziness, presyncope, syncope, snoring, daytime somnolence, bleeding, or  neurologic sequela. The patient is tolerating medications without difficulties and is otherwise without complaint today.    Atrial Fibrillation Risk Factors:  he does have symptoms or diagnosis of sleep apnea. he is compliant with CPAP therapy. he does not have a history of rheumatic fever.   Atrial Fibrillation Management history:  Previous antiarrhythmic drugs: amiodarone, tikosyn Previous cardioversions: several, most recently 10/05/21 Previous ablations: 2008, 2009, 08/27/22 Anticoagulation history: Xarelto, Eliquis   Past Medical History:  Diagnosis Date   Atrial fibrillation (HCC)    CAD (coronary artery disease)    LAD stent in 1999   Hyperlipidemia    Hypertension    PAF (paroxysmal atrial fibrillation) (HCC)    cardioversion in 2013   SSS (sick sinus syndrome) (HCC)     ROS- All systems are reviewed and negative except as per the HPI above.  Physical Exam: Vitals:   02/19/23 0829  BP: 118/82  Pulse: 88  Weight: 107.1 kg  Height: 5\' 6"  (1.676 m)    GEN- The patient is well appearing, alert and oriented x 3 today.   Neck - no JVD or carotid bruit noted Lungs- Clear to ausculation bilaterally, normal work of breathing Heart- Irregular rate and rhythm, no murmurs, rubs or gallops, PMI not laterally displaced Extremities- no clubbing, cyanosis, or edema Skin - no rash or ecchymosis noted   Wt Readings from Last 3 Encounters:  02/19/23 107.1 kg  02/18/23 106.6 kg  02/08/23 104.3 kg    EKG today demonstrates  Vent. rate 88 BPM PR interval * ms QRS duration  106 ms QT/QTcB 436/527 ms P-R-T axes * 81 0 Atrial fibrillation with premature ventricular or aberrantly conducted complexes Low voltage QRS Prolonged QT Abnormal ECG When compared with ECG of 18-Feb-2023 10:43, PREVIOUS ECG IS PRESENT   Echo 10/10/21 demonstrated   1. Left ventricular ejection fraction, by estimation, is 45-50% with beat  to beat variability. The left ventricle has mildly  decreased function.  Left ventricular endocardial border not optimally defined to evaluate  regional wall motion. There is mild left ventricular hypertrophy. Left ventricular diastolic parameters are indeterminate.   2. Right ventricular systolic function is mildly reduced. The right  ventricular size is normal. There is normal pulmonary artery systolic  pressure. The estimated right ventricular systolic pressure is 19.0 mmHg.   3. Left atrial size was mildly dilated.   4. The mitral valve is normal in structure. Mild mitral valve  regurgitation. No evidence of mitral stenosis.   5. The aortic valve is grossly normal. There is mild thickening of the  aortic valve. Aortic valve regurgitation is trivial. No aortic stenosis is  present.   6. The inferior vena cava is normal in size with greater than 50%  respiratory variability, suggesting right atrial pressure of 3 mmHg.   Comparison(s): A prior study was performed on 07/17/20. Prior images  reviewed side by side. LV function has decreased compared to prior exam.    Epic records are reviewed at length today  CHA2DS2-VASc Score = 3  The patient's score is based upon: CHF History: 0 HTN History: 1 Diabetes History: 0 Stroke History: 0 Vascular Disease History: 1 Age Score: 1 Gender Score: 0      ASSESSMENT AND PLAN: Persistent Atrial Fibrillation (ICD10:  I48.19) The patient's CHA2DS2-VASc score is 3, indicating a 3.2% annual risk of stroke.   S/p afib ablation 08/27/22. S/p Tikosyn admission 11/5-8/24.  S/p DCCV on 01/21/23.  S/p successful DCCV on 02/08/23.   He is currently in Afib. I discussed patient's case with EP and after discussion his ERAF despite successful cardioversion indicates Tikosyn failure. Advised patient to stop Tikosyn. Continue Cardizem 180 mg begun by Dr. Tresa Endo. Will have patient f/u with Dr. Elberta Fortis to discuss repeat ablation. It is my hope that PFA can be additionally helpful for this patient.  Continue K+ 10  meq daily.  Secondary Hypercoagulable State (ICD10:  D68.69) The patient is at significant risk for stroke/thromboembolism based upon his CHA2DS2-VASc Score of 3.  Continue Apixaban (Eliquis).   Obesity Body mass index is 38.12 kg/m.  Encouraged lifestyle modification   F/u with Dr. Elberta Fortis.    Justin Mend, PA-C Afib Clinic [facility] [address] [place], [place] [address] [phone] 02/19/2023 8:41 AM

## 2023-02-19 NOTE — Telephone Encounter (Signed)
 RX sent to requested Pharmacy

## 2023-02-21 ENCOUNTER — Encounter: Payer: Self-pay | Admitting: Cardiovascular Disease

## 2023-02-24 ENCOUNTER — Encounter: Payer: Self-pay | Admitting: Cardiology

## 2023-02-24 ENCOUNTER — Ambulatory Visit: Payer: Medicare Other | Attending: Cardiology | Admitting: Cardiology

## 2023-02-24 VITALS — BP 104/62 | HR 84 | Ht 66.0 in | Wt 237.4 lb

## 2023-02-24 DIAGNOSIS — G4733 Obstructive sleep apnea (adult) (pediatric): Secondary | ICD-10-CM | POA: Insufficient documentation

## 2023-02-24 DIAGNOSIS — I2510 Atherosclerotic heart disease of native coronary artery without angina pectoris: Secondary | ICD-10-CM | POA: Diagnosis not present

## 2023-02-24 DIAGNOSIS — D6869 Other thrombophilia: Secondary | ICD-10-CM | POA: Diagnosis not present

## 2023-02-24 DIAGNOSIS — I4819 Other persistent atrial fibrillation: Secondary | ICD-10-CM | POA: Diagnosis not present

## 2023-02-24 NOTE — Progress Notes (Signed)
Electrophysiology Office Note:   Date:  02/24/2023  ID:  Timothy [name], DOB 01-Jul-1945, MRN [id number]  Primary Cardiologist: Nicki Guadalajara, MD Primary Heart Failure: None Electrophysiologist: Commodore Bellew Jorja Loa, MD      History of Present Illness:   Timothy [name] is a 77 y.o. male with h/o coronary disease, hyperlipidemia, hypertension, sleep apnea, atrial fibrillation seen today for routine electrophysiology followup.   He is post ablation x 3, most recently 08/27/2022.  He continued to have atrial fibrillation and has been admitted to the hospital for dofetilide load.  Unfortunately, he could tolerate 125 mcg twice daily of dofetilide.  He had repeat cardioversion 02/08/2023.  Since last being seen in our clinic the patient reports continued episodes of atrial fibrillation.  He feels weak, fatigued, short of breath.  He remained in sinus rhythm for approximately 1 week since starting the dofetilide.  Dofetilide is since been stopped..  he denies chest pain, palpitations, dyspnea, PND, orthopnea, nausea, vomiting, dizziness, syncope, edema, weight gain, or early satiety.   Review of systems complete and found to be negative unless listed in HPI.   EP Information / Studies Reviewed:    EKG is not ordered today. EKG from 02/19/23 reviewed which showed atrial fibrillation        Risk Assessment/Calculations:    CHA2DS2-VASc Score = 3   This indicates a 3.2% annual risk of stroke. The patient's score is based upon: CHF History: 0 HTN History: 1 Diabetes History: 0 Stroke History: 0 Vascular Disease History: 1 Age Score: 1 Gender Score: 0             Physical Exam:   VS:  BP 104/62 (BP Location: Left Arm, Patient Position: Sitting, Cuff Size: Large)   Pulse 84   Ht 5\' 6"  (1.676 m)   Wt 237 lb 6.4 oz (107.7 kg)   SpO2 96%   BMI 38.32 kg/m    Wt Readings from Last 3 Encounters:  02/24/23 237 lb 6.4 oz (107.7 kg)  02/19/23 236 lb 3.2 oz (107.1 kg)  02/18/23 235 lb  (106.6 kg)     GEN: Well nourished, well developed in no acute distress NECK: No JVD; No carotid bruits CARDIAC: Regular rate and rhythm, no murmurs, rubs, gallops RESPIRATORY:  Clear to auscultation without rales, wheezing or rhonchi  ABDOMEN: Soft, non-tender, non-distended EXTREMITIES:  No edema; No deformity   ASSESSMENT AND PLAN:    1.  Persistent atrial fibrillation: Currently on dofetilide.  Has continued to have episodes of atrial fibrillation with cardioversion 02/08/2023.  He has unfortunately continued to have episodes of atrial fibrillation making him feel quite poorly.  Due to that, we Delicia Berens plan for repeat ablation.  Risks and benefits have been discussed.  The patient understands the risks and is agreed to the procedure.  Risk, benefits, and alternatives to EP study and radiofrequency/pulse field ablation for afib were also discussed in detail today. These risks include but are not limited to stroke, bleeding, vascular damage, tamponade, perforation, damage to the esophagus, lungs, and other structures, pulmonary vein stenosis, worsening renal function, and death. The patient understands these risk and wishes to proceed.  We Dunbar Buras therefore proceed with catheter ablation at the next available time.  Carto, ICE, anesthesia are requested for the procedure.  Zayne Draheim also obtain CT PV protocol prior to the procedure to exclude LAA thrombus and further evaluate atrial anatomy.  2.  Secondary hypercoagulable state: Currently on Eliquis for atrial fibrillation  3.  Obesity: Lifestyle modification  encouraged  4.  Coronary artery disease: No current chest pain  5.  Obstructive sleep apnea: CPAP compliance encouraged  Follow up with Dr. Elberta Fortis as usual post procedure  Signed, Ryann Leavitt Jorja Loa, MD

## 2023-02-24 NOTE — Patient Instructions (Signed)
Medication Instructions:  Your physician recommends that you continue on your current medications as directed. Please refer to the Current Medication list given to you today.  *If you need a refill on your cardiac medications before your next appointment, please call your pharmacy*   Lab Work: Pre procedure labs -- we will call you to schedule:  BMP & CBC  If you have a lab test that is abnormal and we need to change your treatment, we will call you to review the results -- otherwise no news is good news.    Testing/Procedures: Your physician has requested that you have cardiac CT 1 month PRIOR to your ablation. Cardiac computed tomography (CT) is a painless test that uses an x-ray machine to take clear, detailed pictures of your heart.  We will call you to schedule.  Your physician has recommended that you have an ablation. Catheter ablation is a medical procedure used to treat some cardiac arrhythmias (irregular heartbeats). During catheter ablation, a long, thin, flexible tube is put into a blood vessel in your groin (upper thigh), or neck. This tube is called an ablation catheter. It is then guided to your heart through the blood vessel. Radio frequency waves destroy small areas of heart tissue where abnormal heartbeats may cause an arrhythmia to start.   We will call you to schedule this procedure, it may be several weeks or more before calling.   Follow-Up: At [facility], you and your health needs are our priority.  As part of our continuing mission to provide you with exceptional heart care, we have created designated Provider Care Teams.  These Care Teams include your primary Cardiologist (physician) and Advanced Practice Providers (APPs -  Physician Assistants and Nurse Practitioners) who all work together to provide you with the care you need, when you need it.  Your next appointment:   1 month(s) after your ablation  The format for your next appointment:   In  Person  Provider:   AFib clinic   Thank you for choosing [facility]!!   Dory Horn, RN [phone]

## 2023-02-26 ENCOUNTER — Ambulatory Visit (HOSPITAL_COMMUNITY): Payer: Medicare Other | Admitting: Internal Medicine

## 2023-03-29 ENCOUNTER — Telehealth: Payer: Self-pay | Admitting: Cardiology

## 2023-03-29 DIAGNOSIS — I4819 Other persistent atrial fibrillation: Secondary | ICD-10-CM

## 2023-03-29 NOTE — Telephone Encounter (Signed)
 Patient stated he is returning RN Sherri's call regarding scheduling for an ablation with Dr. Elberta Fortis.

## 2023-03-30 NOTE — Telephone Encounter (Signed)
 Pt scheduled for afib ablation 3/12. Aware office will be in touch to go over instructions, etc. Patient verbalized understanding and agreeable to plan.

## 2023-03-31 ENCOUNTER — Other Ambulatory Visit: Payer: Self-pay | Admitting: Medical Genetics

## 2023-04-27 ENCOUNTER — Telehealth: Payer: Self-pay

## 2023-04-27 DIAGNOSIS — I4819 Other persistent atrial fibrillation: Secondary | ICD-10-CM | POA: Diagnosis not present

## 2023-04-27 NOTE — Telephone Encounter (Signed)
Pt scheduled for Afib Ablation with Dr. Elberta Fortis on 3/12.  He will go to [place] office today 2/11 for labs. CT is scheduled on 2/14 - he is aware of time to arrive and where to go.  I have ask [place] office to print out his instruction letters for him per his request.  He will review and call with any questions.

## 2023-04-28 ENCOUNTER — Other Ambulatory Visit: Payer: Self-pay | Admitting: Cardiovascular Disease

## 2023-04-28 ENCOUNTER — Telehealth (HOSPITAL_COMMUNITY): Payer: Self-pay

## 2023-04-28 LAB — BASIC METABOLIC PANEL
BUN/Creatinine Ratio: 17 (ref 10–24)
BUN: 15 mg/dL (ref 8–27)
CO2: 20 mmol/L (ref 20–29)
Calcium: 9.2 mg/dL (ref 8.6–10.2)
Chloride: 107 mmol/L — ABNORMAL HIGH (ref 96–106)
Creatinine, Ser: 0.9 mg/dL (ref 0.76–1.27)
Glucose: 107 mg/dL — ABNORMAL HIGH (ref 70–99)
Potassium: 4.4 mmol/L (ref 3.5–5.2)
Sodium: 142 mmol/L (ref 134–144)
eGFR: 89 mL/min/{1.73_m2} (ref >59)

## 2023-04-28 LAB — CBC
Hematocrit: 46.1 % (ref 37.5–51.0)
Hemoglobin: 15.2 g/dL (ref 13.0–17.7)
MCH: 30.7 pg (ref 26.6–33.0)
MCHC: 33 g/dL (ref 31.5–35.7)
MCV: 93 fL (ref 79–97)
Platelets: 238 10*3/uL (ref 150–450)
RBC: 4.95 x10E6/uL (ref 4.14–5.80)
RDW: 13.1 % (ref 11.6–15.4)
WBC: 8.1 10*3/uL (ref 3.4–10.8)

## 2023-04-28 NOTE — Telephone Encounter (Signed)
Spoke with patient to complete one month pre-procedure call.     New medical conditions?  NO Recent hospitalizations or surgeries? NO Started any new medications? NO Patient made aware to contact office to inform of any new medications started. Any changes in activities of daily living? started chair yoga  Pre-procedure testing scheduled: CT on 04/30/23 and lab work completed on 04/27/23. Confirmed patient is taking Eliquis and will continue taking medication before procedure or it may need to be rescheduled.  Confirmed patient is scheduled for Atrial Fibrillation Ablation on Wednesday, March 12 with Dr. Loman Brooklyn. Instructed patient to arrive at the Main Entrance A at [facility]: [address] [place], [place] [address] and check in at Admitting at 6:30 AM  Sometimes we have an availability that comes up earlier than your scheduled procedure date, are you be willing to come in earlier if needed? YES  Advised of plan to go home the same day and will only stay overnight if medically necessary. You MUST have a responsible adult to drive you home and MUST be with you the first 24 hours after you arrive home or your procedure could be cancelled.  Patient verbalized understanding to information provided and is agreeable to proceed with procedure.

## 2023-04-29 ENCOUNTER — Other Ambulatory Visit: Payer: Self-pay | Admitting: Cardiovascular Disease

## 2023-04-30 ENCOUNTER — Ambulatory Visit (HOSPITAL_COMMUNITY)
Admission: RE | Admit: 2023-04-30 | Discharge: 2023-04-30 | Disposition: A | Discharge status: Home / Self Care | Payer: Medicare Other | Source: Ambulatory Visit | Attending: Cardiology | Admitting: Cardiology

## 2023-04-30 DIAGNOSIS — I4819 Other persistent atrial fibrillation: Secondary | ICD-10-CM | POA: Insufficient documentation

## 2023-04-30 MED ORDER — NITROGLYCERIN 0.4 MG SL SUBL
SUBLINGUAL_TABLET | SUBLINGUAL | Status: AC
  Filled 2023-04-30: qty 2

## 2023-04-30 MED ORDER — IOHEXOL 350 MG/ML SOLN
95.0000 mL | Freq: Once | INTRAVENOUS | Status: AC | PRN
  Administered 2023-04-30: 95 mL via INTRAVENOUS

## 2023-05-04 ENCOUNTER — Ambulatory Visit (HOSPITAL_COMMUNITY)
Admission: RE | Admit: 2023-05-04 | Discharge: 2023-05-04 | Discharge status: Home / Self Care | Payer: Medicare Other | Source: Ambulatory Visit | Attending: Physician Assistant | Admitting: Physician Assistant

## 2023-05-04 ENCOUNTER — Other Ambulatory Visit (HOSPITAL_COMMUNITY): Payer: Self-pay

## 2023-05-04 ENCOUNTER — Encounter (HOSPITAL_COMMUNITY): Payer: Self-pay | Admitting: Physician Assistant

## 2023-05-04 ENCOUNTER — Encounter: Payer: Self-pay | Admitting: General Practice

## 2023-05-04 ENCOUNTER — Other Ambulatory Visit (HOSPITAL_BASED_OUTPATIENT_CLINIC_OR_DEPARTMENT_OTHER): Payer: Self-pay

## 2023-05-04 VITALS — BP 114/88 | HR 91 | Ht 66.0 in | Wt 225.6 lb

## 2023-05-04 DIAGNOSIS — Z7901 Long term (current) use of anticoagulants: Secondary | ICD-10-CM | POA: Diagnosis not present

## 2023-05-04 DIAGNOSIS — I4891 Unspecified atrial fibrillation: Secondary | ICD-10-CM | POA: Diagnosis present

## 2023-05-04 DIAGNOSIS — R931 Abnormal findings on diagnostic imaging of heart and coronary circulation: Secondary | ICD-10-CM | POA: Insufficient documentation

## 2023-05-04 DIAGNOSIS — G4733 Obstructive sleep apnea (adult) (pediatric): Secondary | ICD-10-CM | POA: Insufficient documentation

## 2023-05-04 DIAGNOSIS — I2510 Atherosclerotic heart disease of native coronary artery without angina pectoris: Secondary | ICD-10-CM | POA: Insufficient documentation

## 2023-05-04 DIAGNOSIS — E669 Obesity, unspecified: Secondary | ICD-10-CM | POA: Insufficient documentation

## 2023-05-04 DIAGNOSIS — I4819 Other persistent atrial fibrillation: Secondary | ICD-10-CM | POA: Insufficient documentation

## 2023-05-04 DIAGNOSIS — D6869 Other thrombophilia: Secondary | ICD-10-CM | POA: Diagnosis not present

## 2023-05-04 DIAGNOSIS — Z6836 Body mass index (BMI) 36.0-36.9, adult: Secondary | ICD-10-CM | POA: Diagnosis not present

## 2023-05-04 DIAGNOSIS — E785 Hyperlipidemia, unspecified: Secondary | ICD-10-CM | POA: Diagnosis not present

## 2023-05-04 DIAGNOSIS — I10 Essential (primary) hypertension: Secondary | ICD-10-CM | POA: Diagnosis not present

## 2023-05-04 MED ORDER — APIXABAN 5 MG PO TABS: 5.0000 mg | ORAL_TABLET | Freq: Two times a day (BID) | ORAL | Status: DC

## 2023-05-04 MED ORDER — APIXABAN 5 MG PO TABS
5.0000 mg | ORAL_TABLET | Freq: Two times a day (BID) | ORAL | 1 refills | Status: DC
  Filled 2023-05-04 (×2): qty 180, 90d supply, fill #0

## 2023-05-04 NOTE — Patient Instructions (Signed)
 Cardioversion scheduled for: Wednesday, February 26th   - Arrive at the [facility] and go to admitting at 9:00AM   - Do not eat or drink anything after midnight the night prior to your procedure.   - Take all your morning medication (except diabetic medications) with a sip of water prior to arrival.  - You will not be able to drive home after your procedure.    - Do NOT miss any doses of your blood thinner - if you should miss a dose please notify our office immediately.   - If you feel as if you go back into normal rhythm prior to scheduled cardioversion, please notify our office immediately.   If your procedure is canceled in the cardioversion suite you will be charged a cancellation fee.

## 2023-05-04 NOTE — Progress Notes (Signed)
 Primary Care Physician: Street, Stephanie Coup, MD Primary Cardiologist: Dr Tresa Endo Primary Electrophysiologist: Dr Elberta Fortis  Referring Physician: Dr Lucky Rathke is a 78 y.o. male with a history of CAD, HLD, HTN, OSA, atrial fibrillation who presents for follow up in the [facility]. He was previously on amiodarone but developed skin toxicity. He had ablation x2 at [facility] by Dr. Sampson Goon in 2009. He has obstructive sleep apnea. June 2023 was found to be in atrial fibrillation. He had a cardioversion 10/05/2021. At that time, he had 3 unsuccessful attempts at cardioversion. Patient is on Eliquis for a CHADS2VASC score of 3. Patient was scheduled for ablation with Dr Elberta Fortis but the pre ablation CT and TEE showed LA thrombus and the surgery was cancelled. His Xarelto was changed to Eliquis. TEE on 04/03/22 showed persistent but smaller thrombus.   Patient is now s/p afib ablation with Dr Elberta Fortis on 08/27/22.   On follow up, patient reports that he continues to have fatigue and SOB with exertion. He remains in afib. No bleeding issues on anticoagulation.   On follow up 01/19/23, patient is here for Tikosyn admission. No new medications since last OV. No benadryl use. No missed doses of anticoagulant.   On follow up 01/29/23, he is currently in Afib with RVR. S/p Tikosyn admission 11/5-8/24. S/p successful DCCV on 01/21/23 x 2 shocks with 360J. QT prolonged and discharged on Tikosyn 125 mcg BID. Discharged on K+ 10 meq daily. He went out of rhythm yesterday at 1515.   On follow up 02/19/23, he is currently in Afib. S/p successful DCCV on 02/08/23. He is on Tikosyn 125 mcg BID. He feels tired when in Afib and SOB when he exerts himself. No missed doses of Tikosyn or Eliquis.  Follow up 05/04/23. Patient returns for follow up for atrial fibrillation and dofetilide monitoring. He is scheduled for afib ablation with Dr Elberta Fortis on 05/26/23. His pre ablation CT  was questionable for LA thrombus. Patient denies any missed doses of anticoagulation. He feels well today despite being in afib.   Today, he denies symptoms of palpitations, chest pain, shortness of breath, orthopnea, PND, lower extremity edema, dizziness, presyncope, syncope, snoring, daytime somnolence, bleeding, or neurologic sequela. The patient is tolerating medications without difficulties and is otherwise without complaint today.    Atrial Fibrillation Risk Factors:  he does have symptoms or diagnosis of sleep apnea. he does not have a history of rheumatic fever.   Atrial Fibrillation Management history:  Previous antiarrhythmic drugs: amiodarone, tikosyn Previous cardioversions: several, most recently 10/05/21 Previous ablations: 2008, 2009, 08/27/22 Anticoagulation history: Xarelto, Eliquis   Past Medical History:  Diagnosis Date   Atrial fibrillation (HCC)    CAD (coronary artery disease)    LAD stent in 1999   Hyperlipidemia    Hypertension    PAF (paroxysmal atrial fibrillation) (HCC)    cardioversion in 2013   SSS (sick sinus syndrome) (HCC)     ROS- All systems are reviewed and negative except as per the HPI above.  Physical Exam: Vitals:   05/04/23 1104  BP: 114/88  Pulse: 91  Weight: 102.3 kg  Height: 5\' 6"  (1.676 m)     GEN: Well nourished, well developed in no acute distress CARDIAC: Irregularly irregular rate and rhythm, no murmurs, rubs, gallops RESPIRATORY:  Clear to auscultation without rales, wheezing or rhonchi  ABDOMEN: Soft, non-tender, non-distended EXTREMITIES:  No edema; No deformity    Wt Readings from Last  3 Encounters:  05/04/23 102.3 kg  02/24/23 107.7 kg  02/19/23 107.1 kg    EKG today demonstrates  Afib Vent. rate 91 BPM PR interval * ms QRS duration 110 ms QT/QTcB 390/479 ms   Echo 10/10/21 demonstrated   1. Left ventricular ejection fraction, by estimation, is 45-50% with beat  to beat variability. The left ventricle  has mildly decreased function.  Left ventricular endocardial border not optimally defined to evaluate  regional wall motion. There is mild left ventricular hypertrophy. Left ventricular diastolic parameters are indeterminate.   2. Right ventricular systolic function is mildly reduced. The right  ventricular size is normal. There is normal pulmonary artery systolic  pressure. The estimated right ventricular systolic pressure is 19.0 mmHg.   3. Left atrial size was mildly dilated.   4. The mitral valve is normal in structure. Mild mitral valve  regurgitation. No evidence of mitral stenosis.   5. The aortic valve is grossly normal. There is mild thickening of the  aortic valve. Aortic valve regurgitation is trivial. No aortic stenosis is  present.   6. The inferior vena cava is normal in size with greater than 50%  respiratory variability, suggesting right atrial pressure of 3 mmHg.   Comparison(s): A prior study was performed on 07/17/20. Prior images  reviewed side by side. LV function has decreased compared to prior exam.    Epic records are reviewed at length today  CHA2DS2-VASc Score = 3  The patient's score is based upon: CHF History: 0 HTN History: 1 Diabetes History: 0 Stroke History: 0 Vascular Disease History: 1 Age Score: 1 Gender Score: 0       ASSESSMENT AND PLAN: Persistent Atrial Fibrillation (ICD10:  I48.19) The patient's CHA2DS2-VASc score is 3, indicating a 3.2% annual risk of stroke.   S/p afib ablation 08/27/22 S/p dofetilide loading 01/2023 but failed to maintain SR. Patient is scheduled for repeat ablation on 05/26/23. Possible LA thrombus noted on CT. Will arrange for TEE to evaluate. If no thrombus is detected, will then pursue DCCV and then ablation as scheduled.  Continue Eliquis 5 mg BID Continue diltiazem 180 mg daily with 30 mg PRN q 4 hours for heart racing. Continue Lopressor 100 mg BID  Secondary Hypercoagulable State (ICD10:  D68.69) The patient  is at significant risk for stroke/thromboembolism based upon his CHA2DS2-VASc Score of 3.  Continue Apixaban (Eliquis). No bleeding issues.  Obesity Body mass index is 36.41 kg/m.  Encouraged lifestyle modification  Possible LAA thrombus TEE as above.   Follow up with Dr Elberta Fortis for ablation as scheduled pending results of TEE.    Informed Consent   Shared Decision Making/Informed Consent   The risks [stroke, cardiac arrhythmias rarely resulting in the need for a temporary or permanent pacemaker, skin irritation or burns, esophageal damage, perforation (1:10,000 risk), bleeding, pharyngeal hematoma as well as other potential complications associated with conscious sedation including aspiration, arrhythmia, respiratory failure and death], benefits (treatment guidance, restoration of normal sinus rhythm, diagnostic support) and alternatives of a transesophageal echocardiogram guided cardioversion were discussed in detail with [name] and he is willing to proceed.      Jorja Loa PA-C Af[facility] [address] [place], [place] [address] [phone] 05/04/2023 11:41 AM

## 2023-05-04 NOTE — H&P (View-Only) (Signed)
 Primary Care Physician: Street, Stephanie Coup, MD Primary Cardiologist: Dr Tresa Endo Primary Electrophysiologist: Dr Elberta Fortis  Referring Physician: Dr Lucky Rathke is a 78 y.o. male with a history of CAD, HLD, HTN, OSA, atrial fibrillation who presents for follow up in the [facility]. He was previously on amiodarone but developed skin toxicity. He had ablation x2 at [facility] by Dr. Sampson Goon in 2009. He has obstructive sleep apnea. June 2023 was found to be in atrial fibrillation. He had a cardioversion 10/05/2021. At that time, he had 3 unsuccessful attempts at cardioversion. Patient is on Eliquis for a CHADS2VASC score of 3. Patient was scheduled for ablation with Dr Elberta Fortis but the pre ablation CT and TEE showed LA thrombus and the surgery was cancelled. His Xarelto was changed to Eliquis. TEE on 04/03/22 showed persistent but smaller thrombus.   Patient is now s/p afib ablation with Dr Elberta Fortis on 08/27/22.   On follow up, patient reports that he continues to have fatigue and SOB with exertion. He remains in afib. No bleeding issues on anticoagulation.   On follow up 01/19/23, patient is here for Tikosyn admission. No new medications since last OV. No benadryl use. No missed doses of anticoagulant.   On follow up 01/29/23, he is currently in Afib with RVR. S/p Tikosyn admission 11/5-8/24. S/p successful DCCV on 01/21/23 x 2 shocks with 360J. QT prolonged and discharged on Tikosyn 125 mcg BID. Discharged on K+ 10 meq daily. He went out of rhythm yesterday at 1515.   On follow up 02/19/23, he is currently in Afib. S/p successful DCCV on 02/08/23. He is on Tikosyn 125 mcg BID. He feels tired when in Afib and SOB when he exerts himself. No missed doses of Tikosyn or Eliquis.  Follow up 05/04/23. Patient returns for follow up for atrial fibrillation and dofetilide monitoring. He is scheduled for afib ablation with Dr Elberta Fortis on 05/26/23. His pre ablation CT  was questionable for LA thrombus. Patient denies any missed doses of anticoagulation. He feels well today despite being in afib.   Today, he denies symptoms of palpitations, chest pain, shortness of breath, orthopnea, PND, lower extremity edema, dizziness, presyncope, syncope, snoring, daytime somnolence, bleeding, or neurologic sequela. The patient is tolerating medications without difficulties and is otherwise without complaint today.    Atrial Fibrillation Risk Factors:  he does have symptoms or diagnosis of sleep apnea. he does not have a history of rheumatic fever.   Atrial Fibrillation Management history:  Previous antiarrhythmic drugs: amiodarone, tikosyn Previous cardioversions: several, most recently 10/05/21 Previous ablations: 2008, 2009, 08/27/22 Anticoagulation history: Xarelto, Eliquis   Past Medical History:  Diagnosis Date   Atrial fibrillation (HCC)    CAD (coronary artery disease)    LAD stent in 1999   Hyperlipidemia    Hypertension    PAF (paroxysmal atrial fibrillation) (HCC)    cardioversion in 2013   SSS (sick sinus syndrome) (HCC)     ROS- All systems are reviewed and negative except as per the HPI above.  Physical Exam: Vitals:   05/04/23 1104  BP: 114/88  Pulse: 91  Weight: 102.3 kg  Height: 5\' 6"  (1.676 m)     GEN: Well nourished, well developed in no acute distress CARDIAC: Irregularly irregular rate and rhythm, no murmurs, rubs, gallops RESPIRATORY:  Clear to auscultation without rales, wheezing or rhonchi  ABDOMEN: Soft, non-tender, non-distended EXTREMITIES:  No edema; No deformity    Wt Readings from Last  3 Encounters:  05/04/23 102.3 kg  02/24/23 107.7 kg  02/19/23 107.1 kg    EKG today demonstrates  Afib Vent. rate 91 BPM PR interval * ms QRS duration 110 ms QT/QTcB 390/479 ms   Echo 10/10/21 demonstrated   1. Left ventricular ejection fraction, by estimation, is 45-50% with beat  to beat variability. The left ventricle  has mildly decreased function.  Left ventricular endocardial border not optimally defined to evaluate  regional wall motion. There is mild left ventricular hypertrophy. Left ventricular diastolic parameters are indeterminate.   2. Right ventricular systolic function is mildly reduced. The right  ventricular size is normal. There is normal pulmonary artery systolic  pressure. The estimated right ventricular systolic pressure is 19.0 mmHg.   3. Left atrial size was mildly dilated.   4. The mitral valve is normal in structure. Mild mitral valve  regurgitation. No evidence of mitral stenosis.   5. The aortic valve is grossly normal. There is mild thickening of the  aortic valve. Aortic valve regurgitation is trivial. No aortic stenosis is  present.   6. The inferior vena cava is normal in size with greater than 50%  respiratory variability, suggesting right atrial pressure of 3 mmHg.   Comparison(s): A prior study was performed on 07/17/20. Prior images  reviewed side by side. LV function has decreased compared to prior exam.    Epic records are reviewed at length today  CHA2DS2-VASc Score = 3  The patient's score is based upon: CHF History: 0 HTN History: 1 Diabetes History: 0 Stroke History: 0 Vascular Disease History: 1 Age Score: 1 Gender Score: 0       ASSESSMENT AND PLAN: Persistent Atrial Fibrillation (ICD10:  I48.19) The patient's CHA2DS2-VASc score is 3, indicating a 3.2% annual risk of stroke.   S/p afib ablation 08/27/22 S/p dofetilide loading 01/2023 but failed to maintain SR. Patient is scheduled for repeat ablation on 05/26/23. Possible LA thrombus noted on CT. Will arrange for TEE to evaluate. If no thrombus is detected, will then pursue DCCV and then ablation as scheduled.  Continue Eliquis 5 mg BID Continue diltiazem 180 mg daily with 30 mg PRN q 4 hours for heart racing. Continue Lopressor 100 mg BID  Secondary Hypercoagulable State (ICD10:  D68.69) The patient  is at significant risk for stroke/thromboembolism based upon his CHA2DS2-VASc Score of 3.  Continue Apixaban (Eliquis). No bleeding issues.  Obesity Body mass index is 36.41 kg/m.  Encouraged lifestyle modification  Possible LAA thrombus TEE as above.   Follow up with Dr Elberta Fortis for ablation as scheduled pending results of TEE.    Informed Consent   Shared Decision Making/Informed Consent   The risks [stroke, cardiac arrhythmias rarely resulting in the need for a temporary or permanent pacemaker, skin irritation or burns, esophageal damage, perforation (1:10,000 risk), bleeding, pharyngeal hematoma as well as other potential complications associated with conscious sedation including aspiration, arrhythmia, respiratory failure and death], benefits (treatment guidance, restoration of normal sinus rhythm, diagnostic support) and alternatives of a transesophageal echocardiogram guided cardioversion were discussed in detail with Mr. Trickey and he is willing to proceed.      Jorja Loa PA-C Af[facility] Clinic [facility] [address] [place], [place] [address] [phone] 05/04/2023 11:41 AM

## 2023-05-05 ENCOUNTER — Other Ambulatory Visit (HOSPITAL_BASED_OUTPATIENT_CLINIC_OR_DEPARTMENT_OTHER): Payer: Self-pay

## 2023-05-06 ENCOUNTER — Other Ambulatory Visit (HOSPITAL_BASED_OUTPATIENT_CLINIC_OR_DEPARTMENT_OTHER): Payer: Self-pay

## 2023-05-06 ENCOUNTER — Telehealth (HOSPITAL_COMMUNITY): Payer: Self-pay

## 2023-05-06 NOTE — Telephone Encounter (Signed)
 No pre-cert needed for TEE in 05/12/23

## 2023-05-10 ENCOUNTER — Other Ambulatory Visit (HOSPITAL_BASED_OUTPATIENT_CLINIC_OR_DEPARTMENT_OTHER): Payer: Self-pay

## 2023-05-10 MED ORDER — POTASSIUM CHLORIDE ER 10 MEQ PO TBCR
10.0000 meq | EXTENDED_RELEASE_TABLET | Freq: Every day | ORAL | 3 refills | Status: DC
  Filled 2023-05-10: qty 90, 90d supply, fill #0
  Filled 2023-08-11: qty 90, 90d supply, fill #1
  Filled 2023-12-09: qty 90, 90d supply, fill #2

## 2023-05-10 MED ORDER — METOPROLOL TARTRATE 100 MG PO TABS
100.0000 mg | ORAL_TABLET | Freq: Two times a day (BID) | ORAL | 2 refills | Status: DC
  Filled 2023-05-10: qty 180, 90d supply, fill #0

## 2023-05-10 MED ORDER — DILTIAZEM HCL ER 180 MG PO CP24
180.0000 mg | ORAL_CAPSULE | Freq: Every day | ORAL | 3 refills | Status: DC
  Filled 2023-05-10: qty 90, 90d supply, fill #0
  Filled 2023-08-11: qty 90, 90d supply, fill #1
  Filled 2023-11-24: qty 90, 90d supply, fill #2

## 2023-05-10 MED ORDER — ELIQUIS 5 MG PO TABS
5.0000 mg | ORAL_TABLET | Freq: Two times a day (BID) | ORAL | 1 refills | Status: DC
Start: 2023-02-18 — End: 2023-11-08
  Filled 2023-05-10 – 2023-05-28 (×2): qty 180, 90d supply, fill #0
  Filled 2023-06-16: qty 60, 30d supply, fill #0
  Filled 2023-07-12: qty 60, 30d supply, fill #1
  Filled 2023-08-11: qty 60, 30d supply, fill #2
  Filled 2023-09-13: qty 60, 30d supply, fill #3
  Filled 2023-10-12: qty 60, 30d supply, fill #4

## 2023-05-10 MED ORDER — ATORVASTATIN CALCIUM 40 MG PO TABS
40.0000 mg | ORAL_TABLET | Freq: Every day | ORAL | 0 refills | Status: DC
  Filled 2023-05-10: qty 90, 90d supply, fill #0
  Filled 2023-08-11: qty 60, 60d supply, fill #1

## 2023-05-10 MED FILL — Diltiazem HCl Tab 30 MG: ORAL | 90 days supply | Qty: 540 | Fill #0 | Status: CN

## 2023-05-11 ENCOUNTER — Other Ambulatory Visit (HOSPITAL_BASED_OUTPATIENT_CLINIC_OR_DEPARTMENT_OTHER): Payer: Self-pay

## 2023-05-11 NOTE — Progress Notes (Signed)
 Spoke to pt and instructed them to come at 0815 and to be NPO after 0000.  Confirmed no missed doses of AC and instructed to take in AM with a small sip of water.  Confirmed that pt will have a ride home and someone to stay with them for 24 hours after the procedure. Instructed patient to not wear any jewelry or lotion.

## 2023-05-12 ENCOUNTER — Encounter (HOSPITAL_COMMUNITY): Payer: Self-pay | Admitting: Internal Medicine

## 2023-05-12 ENCOUNTER — Ambulatory Visit (HOSPITAL_BASED_OUTPATIENT_CLINIC_OR_DEPARTMENT_OTHER): Payer: Medicare Other

## 2023-05-12 ENCOUNTER — Encounter (HOSPITAL_COMMUNITY)
Admission: RE | Disposition: A | Discharge status: Home / Self Care | Payer: Self-pay | Source: Home / Self Care | Attending: Internal Medicine

## 2023-05-12 ENCOUNTER — Ambulatory Visit (HOSPITAL_COMMUNITY): Payer: Medicare Other

## 2023-05-12 ENCOUNTER — Ambulatory Visit (HOSPITAL_COMMUNITY)
Admission: RE | Admit: 2023-05-12 | Discharge: 2023-05-12 | Disposition: A | Discharge status: Home / Self Care | Payer: Medicare Other | Attending: Internal Medicine | Admitting: Internal Medicine

## 2023-05-12 ENCOUNTER — Other Ambulatory Visit: Payer: Self-pay

## 2023-05-12 DIAGNOSIS — Z6836 Body mass index (BMI) 36.0-36.9, adult: Secondary | ICD-10-CM | POA: Diagnosis not present

## 2023-05-12 DIAGNOSIS — I10 Essential (primary) hypertension: Secondary | ICD-10-CM | POA: Insufficient documentation

## 2023-05-12 DIAGNOSIS — E669 Obesity, unspecified: Secondary | ICD-10-CM | POA: Insufficient documentation

## 2023-05-12 DIAGNOSIS — E785 Hyperlipidemia, unspecified: Secondary | ICD-10-CM | POA: Insufficient documentation

## 2023-05-12 DIAGNOSIS — I340 Nonrheumatic mitral (valve) insufficiency: Secondary | ICD-10-CM

## 2023-05-12 DIAGNOSIS — I4891 Unspecified atrial fibrillation: Secondary | ICD-10-CM

## 2023-05-12 DIAGNOSIS — Z79899 Other long term (current) drug therapy: Secondary | ICD-10-CM | POA: Insufficient documentation

## 2023-05-12 DIAGNOSIS — G4733 Obstructive sleep apnea (adult) (pediatric): Secondary | ICD-10-CM | POA: Insufficient documentation

## 2023-05-12 DIAGNOSIS — I2510 Atherosclerotic heart disease of native coronary artery without angina pectoris: Secondary | ICD-10-CM | POA: Diagnosis not present

## 2023-05-12 DIAGNOSIS — D6869 Other thrombophilia: Secondary | ICD-10-CM | POA: Diagnosis not present

## 2023-05-12 DIAGNOSIS — Z7901 Long term (current) use of anticoagulants: Secondary | ICD-10-CM | POA: Insufficient documentation

## 2023-05-12 DIAGNOSIS — K219 Gastro-esophageal reflux disease without esophagitis: Secondary | ICD-10-CM | POA: Diagnosis not present

## 2023-05-12 DIAGNOSIS — I4819 Other persistent atrial fibrillation: Secondary | ICD-10-CM | POA: Insufficient documentation

## 2023-05-12 DIAGNOSIS — Z955 Presence of coronary angioplasty implant and graft: Secondary | ICD-10-CM | POA: Diagnosis not present

## 2023-05-12 DIAGNOSIS — I495 Sick sinus syndrome: Secondary | ICD-10-CM | POA: Diagnosis not present

## 2023-05-12 HISTORY — PX: TRANSESOPHAGEAL ECHOCARDIOGRAM (CATH LAB): EP1270

## 2023-05-12 HISTORY — PX: CARDIOVERSION: EP1203

## 2023-05-12 LAB — ECHO TEE

## 2023-05-12 SURGERY — TRANSESOPHAGEAL ECHOCARDIOGRAM (TEE) (CATHLAB)
Anesthesia: General

## 2023-05-12 MED ORDER — SODIUM CHLORIDE 0.9% FLUSH: 3.0000 mL | INTRAVENOUS | Status: DC | PRN

## 2023-05-12 MED ORDER — PROPOFOL 10 MG/ML IV BOLUS
INTRAVENOUS | Status: DC | PRN
  Administered 2023-05-12 (×2): 50 mg via INTRAVENOUS
  Administered 2023-05-12: 250 ug/kg/min via INTRAVENOUS

## 2023-05-12 MED ORDER — SODIUM CHLORIDE 0.9% FLUSH: 3.0000 mL | Freq: Two times a day (BID) | INTRAVENOUS | Status: DC

## 2023-05-12 SURGICAL SUPPLY — 1 items: PAD DEFIB RADIO PHYSIO CONN (PAD) ×1 IMPLANT

## 2023-05-12 NOTE — Anesthesia Procedure Notes (Signed)
 Procedure Name: MAC Date/Time: 05/12/2023 9:56 AM  Performed by: Bartholomew Crews, CRNAPre-anesthesia Checklist: Patient identified, Emergency Drugs available, Suction available, Patient being monitored and Timeout performed Patient Re-evaluated:Patient Re-evaluated prior to induction Preoxygenation: Pre-oxygenation with 100% oxygen Induction Type: IV induction Placement Confirmation: positive ETCO2 Dental Injury: Teeth and Oropharynx as per pre-operative assessment

## 2023-05-12 NOTE — Interval H&P Note (Signed)
 History and Physical Interval Note:  05/12/2023 9:42 AM  Timothy [name]  has presented today for surgery, with the diagnosis of AFIB.  The various methods of treatment have been discussed with the patient and family. After consideration of risks, benefits and other options for treatment, the patient has consented to  Procedure(s): TRANSESOPHAGEAL ECHOCARDIOGRAM (N/A) CARDIOVERSION (N/A) as a surgical intervention.  The patient's history has been reviewed, patient examined, no change in status, stable for surgery.  I have reviewed the patient's chart and labs.  Questions were answered to the patient's satisfaction.     Chrystie Nose

## 2023-05-12 NOTE — CV Procedure (Signed)
 TEE/CARDIOVERSION NOTE  TRANSESOPHAGEAL ECHOCARDIOGRAM (TEE):  Indictation: Atrial Fibrillation  Consent:   Informed consent was obtained prior to the procedure. The risks, benefits and alternatives for the procedure were discussed and the patient comprehended these risks.  Risks include, but are not limited to, cough, sore throat, vomiting, nausea, somnolence, esophageal and stomach trauma or perforation, bleeding, low blood pressure, aspiration, pneumonia, infection, trauma to the teeth and death.    Time Out: Verified patient identification, verified procedure, site/side was marked, verified correct patient position, special equipment/implants available, medications/allergies/relevent history reviewed, required imaging and test results available. Performed  Procedure:  After a procedural time-out, the patient was given propofol per anesthesia for sedation. See their separate report for details. The patient's heart rate, blood pressure, and oxygen saturation are monitored continuously during the procedure. The oropharynx was anesthetized with topical cetacaine.  The transesophageal probe was inserted in the esophagus and stomach without difficulty and multiple views were obtained. Agitated microbubble saline contrast was not administered.  Complications:    Complications: None Patient did tolerate procedure well.  Findings:  LEFT VENTRICLE: The left ventricular wall thickness is normal.  The left ventricular cavity is normal in size. Wall motion is normal.  LVEF is 50-55%.  RIGHT VENTRICLE:  The right ventricle demonstrates mildly reduced systolic function without any thrombus or masses.    LEFT ATRIUM:  The left atrium is severely in size without any thrombus or masses.  There is spontaneous echo contrast ("smoke") in the left atrium consistent with a low flow state.  LEFT ATRIAL APPENDAGE:  The left atrial appendage is free of any thrombus or masses. The appendage has single  lobes. Pulse doppler indicates moderate flow in the appendage. Smoke is noted in the appendage, however, color doppler is noted to the apex, the pectinates are clearly visible and the appendage was visualized in multiple planes to exclude thrombus.  ATRIAL SEPTUM:  The atrial septum appears intact and is free of thrombus and/or masses.  There is no evidence for interatrial shunting by color doppler and saline microbubble.  RIGHT ATRIUM:  The right atrium is normal in size and function without any thrombus or masses.  MITRAL VALVE:  The mitral valve is degenerative/sclerotic with Mild regurgitation.  There were no vegetations or stenosis.  AORTIC VALVE:  The aortic valve is trileaflet, normal in structure and function with  trivial  regurgitation.  There were no vegetations or stenosis  TRICUSPID VALVE:  The tricuspid valve is normal in structure and function with  trivial  regurgitation.  There were no vegetations or stenosis   PULMONIC VALVE:  The pulmonic valve is normal in structure and function with  no  regurgitation.  There were no vegetations or stenosis.   AORTIC ARCH, ASCENDING AND DESCENDING AORTA:  There was grade 1 Myrtis Ser et. Al, 1992) atherosclerosis of the proximal descending aorta.  12. PULMONARY VEINS: Anomalous pulmonary venous return was not noted.  13. PERICARDIUM: The pericardium appeared normal and non-thickened.  There is no pericardial effusion.  CARDIOVERSION:     Second Time Out: Verified patient identification, verified procedure, site/side was marked, verified correct patient position, special equipment/implants available, medications/allergies/relevent history reviewed, required imaging and test results available.  Performed  Procedure:  Patient placed on cardiac monitor, pulse oximetry, supplemental oxygen as necessary.  Sedation administered per anesthesia Pacer pads placed anterior and posterior chest. Cardioverted 2 time(s).  Cardioverted at 360J biphasic  (and with pressure during the second attempt) which was ultimately successful.  Complications:  Complications: None Patient did tolerate procedure well.  Impression:  No LAA thrombus, however, LA smoke was noted Negative for PFO Mild MR Severe LAE LVEF 50-55% Successful DCCV to sinus bradycardia with PAC's and PVC's.  Recommendations:  Continue anticoagulation. Follow-up with Dr. Elberta Fortis for scheduled afib ablation in March.  Time Spent Directly with the Patient:  60 minutes   Chrystie Nose, MD, [facility], FACP  [place]  [facility]  Medical Director of the [facility] &  Cardiovascular Risk Reduction Clinic Diplomate of the American Board of Clinical Lipidology Attending Cardiologist  Direct Dial: [phone]  Fax: [phone]  Website:  [email or web address] 05/12/2023, 10:43 AM

## 2023-05-12 NOTE — Anesthesia Postprocedure Evaluation (Signed)
 Anesthesia Post Note  Patient: TAGG EUSTICE  Procedure(s) Performed: TRANSESOPHAGEAL ECHOCARDIOGRAM CARDIOVERSION     Patient location during evaluation: Cath Lab Anesthesia Type: General Level of consciousness: awake and alert Pain management: pain level controlled Vital Signs Assessment: post-procedure vital signs reviewed and stable Respiratory status: spontaneous breathing, nonlabored ventilation, respiratory function stable and patient connected to nasal cannula oxygen Cardiovascular status: blood pressure returned to baseline and stable Postop Assessment: no apparent nausea or vomiting Anesthetic complications: no   No notable events documented.  Last Vitals:  Vitals:   05/12/23 1115 05/12/23 1125  BP: 110/66 115/63  Pulse: (!) 48 (!) 51  Resp: 12 17  Temp:    SpO2: 95% 95%    Last Pain:  Vitals:   05/12/23 1125  TempSrc:   PainSc: 0-No pain                 Collene Schlichter

## 2023-05-12 NOTE — Anesthesia Preprocedure Evaluation (Addendum)
 Anesthesia Evaluation  Patient identified by MRN, date of birth, ID band Patient awake    Reviewed: Allergy & Precautions, NPO status , Patient's Chart, lab work & pertinent test results, reviewed documented beta blocker date and time   Airway Mallampati: III  TM Distance: >3 FB Neck ROM: Full    Dental  (+) Teeth Intact, Dental Advisory Given   Pulmonary sleep apnea and Continuous Positive Airway Pressure Ventilation , former smoker   Pulmonary exam normal breath sounds clear to auscultation       Cardiovascular hypertension, Pt. on medications and Pt. on home beta blockers + angina  + CAD and + Cardiac Stents (LAD)  + dysrhythmias (SSS) Atrial Fibrillation  Rhythm:Irregular Rate:Abnormal     Neuro/Psych negative neurological ROS  negative psych ROS   GI/Hepatic Neg liver ROS,GERD  Medicated,,  Endo/Other  Obesity   Renal/GU negative Renal ROS     Musculoskeletal negative musculoskeletal ROS (+)    Abdominal   Peds  Hematology  (+) Blood dyscrasia (Eliquis)   Anesthesia Other Findings Day of surgery medications reviewed with the patient.  Reproductive/Obstetrics                             Anesthesia Physical Anesthesia Plan  ASA: 3  Anesthesia Plan: General   Post-op Pain Management: Minimal or no pain anticipated   Induction: Intravenous  PONV Risk Score and Plan: 1 and TIVA  Airway Management Planned: Mask  Additional Equipment:   Intra-op Plan:   Post-operative Plan:   Informed Consent: I have reviewed the patients History and Physical, chart, labs and discussed the procedure including the risks, benefits and alternatives for the proposed anesthesia with the patient or authorized representative who has indicated his/her understanding and acceptance.     Dental advisory given  Plan Discussed with: CRNA  Anesthesia Plan Comments:        Anesthesia Quick  Evaluation

## 2023-05-12 NOTE — Progress Notes (Signed)
*  PRELIMINARY RESULTS* Echocardiogram Echocardiogram Transesophageal has been performed.  Timothy [name] 05/12/2023, 10:48 AM

## 2023-05-12 NOTE — Transfer of Care (Signed)
 Immediate Anesthesia Transfer of Care Note  Patient: Timothy [name]  Procedure(s) Performed: TRANSESOPHAGEAL ECHOCARDIOGRAM CARDIOVERSION  Patient Location: PACU  Anesthesia Type:General  Level of Consciousness: sedated  Airway & Oxygen Therapy: Patient Spontanous Breathing  Post-op Assessment: Report given to RN  Post vital signs: Reviewed and stable  Last Vitals:  Vitals Value Taken Time  BP 96/61 05/12/23 1047  Temp    Pulse 52 05/12/23 1048  Resp 22 05/12/23 1048  SpO2 98 % 05/12/23 1048  Vitals shown include unfiled device data.  Last Pain:  Vitals:   05/12/23 0825  TempSrc:   PainSc: 0-No pain         Complications: No notable events documented.

## 2023-05-19 ENCOUNTER — Telehealth (HOSPITAL_COMMUNITY): Payer: Self-pay

## 2023-05-19 NOTE — Telephone Encounter (Signed)
 Call placed to patient to discuss upcoming procedure.   CT: completed. TEE completed.  Labs: completed and acceptable.   Any recent signs of acute illness or been started on antibiotics? NO Any diabetic medications to hold?  NO Any missed doses of blood thinner?  NO Advised patient to continue taking ANTICOAGULANT: Eliquis (Apixaban) without missing any doses.  Medication instructions:  On the morning of your procedure DO NOT take any medication., including Eliquis or the procedure may be rescheduled. Nothing to eat or drink after midnight prior to your procedure.  Confirmed patient is scheduled for Atrial Fibrillation Ablation on Wednesday, March 12 with Dr. Loman Brooklyn. Instructed patient to arrive at the Main Entrance A at [facility]: [address] [place], [place] [address] and check in at Admitting at 6:30 AM.  Advised of plan to go home the same day and will only stay overnight if medically necessary. You MUST have a responsible adult to drive you home and MUST be with you the first 24 hours after you arrive home or your procedure could be cancelled.  Patient verbalized understanding to all instructions provided and agreed to proceed with procedure.

## 2023-05-25 NOTE — Anesthesia Preprocedure Evaluation (Signed)
 Anesthesia Evaluation  Patient identified by MRN, date of birth, ID band Patient awake    Reviewed: Allergy & Precautions, NPO status , Patient's Chart, lab work & pertinent test results, reviewed documented beta blocker date and time   History of Anesthesia Complications (+) DIFFICULT AIRWAY and history of anesthetic complications  Airway Mallampati: III  TM Distance: >3 FB Neck ROM: Full   Comment: Ventilation: Mask ventilation with difficulty, Oral airway inserted - appropriate to patient size and Two handed mask ventilation required Laryngoscope Size: Glidescope and 4 Grade View: Grade II Tube type: Oral Tube size: 7.5 mm Number of attempts: 1 Airway Equipment and Method: Oral airway, Rigid stylet and Video-laryngoscopy Placement Confirmation: ETT inserted through vocal cords under direct vision, positive ETCO2 and breath sounds checked- equal and bilateral Secured at: 21 cm Tube secured with: Tape Dental Injury: Teeth and Oropharynx as per pre-operative assessment  Difficulty Due To: Difficulty was anticipated, Difficult Airway- due to large tongue, Difficult Airway- due to reduced neck mobility and Difficult Airway- due to limited oral opening    Dental  (+) Teeth Intact, Dental Advisory Given   Pulmonary sleep apnea and Continuous Positive Airway Pressure Ventilation , former smoker   Pulmonary exam normal breath sounds clear to auscultation       Cardiovascular hypertension, Pt. on medications and Pt. on home beta blockers + angina  + CAD and + Cardiac Stents (LAD)  + dysrhythmias (SSS) Atrial Fibrillation  Rhythm:Irregular Rate:Abnormal     Neuro/Psych negative neurological ROS  negative psych ROS   GI/Hepatic Neg liver ROS,GERD  Medicated,,  Endo/Other  Obesity   Renal/GU negative Renal ROS     Musculoskeletal negative musculoskeletal ROS (+)    Abdominal   Peds  Hematology  (+) Blood dyscrasia  (Eliquis)   Anesthesia Other Findings Day of surgery medications reviewed with the patient.  Reproductive/Obstetrics                             Anesthesia Physical Anesthesia Plan  ASA: 3  Anesthesia Plan: General   Post-op Pain Management: Minimal or no pain anticipated   Induction: Intravenous  PONV Risk Score and Plan: 1 and Ondansetron, Dexamethasone and Treatment may vary due to age or medical condition  Airway Management Planned: Oral ETT and Video Laryngoscope Planned  Additional Equipment: None  Intra-op Plan:   Post-operative Plan:   Informed Consent: I have reviewed the patients History and Physical, chart, labs and discussed the procedure including the risks, benefits and alternatives for the proposed anesthesia with the patient or authorized representative who has indicated his/her understanding and acceptance.     Dental advisory given  Plan Discussed with: CRNA and Anesthesiologist  Anesthesia Plan Comments:        Anesthesia Quick Evaluation

## 2023-05-25 NOTE — Pre-Procedure Instructions (Signed)
 Instructed patient on the following items: Arrival time 0615 Nothing to eat or drink after midnight No meds AM of procedure Responsible person to drive you home and stay with you for 24 hrs  Have you missed any doses of anti-coagulant Eliquis- takes twice a day, hasn't missed any doses.  Don't take dose morning of procedure.

## 2023-05-26 ENCOUNTER — Ambulatory Visit (HOSPITAL_BASED_OUTPATIENT_CLINIC_OR_DEPARTMENT_OTHER): Payer: Self-pay | Admitting: Anesthesiology

## 2023-05-26 ENCOUNTER — Ambulatory Visit (HOSPITAL_COMMUNITY): Payer: Self-pay | Admitting: Anesthesiology

## 2023-05-26 ENCOUNTER — Ambulatory Visit (HOSPITAL_COMMUNITY)
Admission: RE | Admit: 2023-05-26 | Discharge: 2023-05-26 | Disposition: A | Discharge status: Home / Self Care | Payer: Medicare Other | Attending: Cardiology | Admitting: Cardiology

## 2023-05-26 ENCOUNTER — Other Ambulatory Visit: Payer: Self-pay

## 2023-05-26 ENCOUNTER — Encounter (HOSPITAL_COMMUNITY)
Admission: RE | Disposition: A | Discharge status: Home / Self Care | Payer: Self-pay | Source: Home / Self Care | Attending: Cardiology

## 2023-05-26 DIAGNOSIS — K219 Gastro-esophageal reflux disease without esophagitis: Secondary | ICD-10-CM | POA: Diagnosis not present

## 2023-05-26 DIAGNOSIS — I10 Essential (primary) hypertension: Secondary | ICD-10-CM | POA: Insufficient documentation

## 2023-05-26 DIAGNOSIS — Z79899 Other long term (current) drug therapy: Secondary | ICD-10-CM | POA: Insufficient documentation

## 2023-05-26 DIAGNOSIS — I4891 Unspecified atrial fibrillation: Secondary | ICD-10-CM

## 2023-05-26 DIAGNOSIS — I2510 Atherosclerotic heart disease of native coronary artery without angina pectoris: Secondary | ICD-10-CM | POA: Diagnosis not present

## 2023-05-26 DIAGNOSIS — Z6836 Body mass index (BMI) 36.0-36.9, adult: Secondary | ICD-10-CM | POA: Insufficient documentation

## 2023-05-26 DIAGNOSIS — E669 Obesity, unspecified: Secondary | ICD-10-CM | POA: Diagnosis not present

## 2023-05-26 DIAGNOSIS — G4733 Obstructive sleep apnea (adult) (pediatric): Secondary | ICD-10-CM | POA: Diagnosis not present

## 2023-05-26 DIAGNOSIS — D6869 Other thrombophilia: Secondary | ICD-10-CM | POA: Insufficient documentation

## 2023-05-26 DIAGNOSIS — E785 Hyperlipidemia, unspecified: Secondary | ICD-10-CM | POA: Insufficient documentation

## 2023-05-26 DIAGNOSIS — I4819 Other persistent atrial fibrillation: Secondary | ICD-10-CM | POA: Diagnosis not present

## 2023-05-26 DIAGNOSIS — Z7901 Long term (current) use of anticoagulants: Secondary | ICD-10-CM | POA: Insufficient documentation

## 2023-05-26 HISTORY — PX: ATRIAL FIBRILLATION ABLATION: EP1191

## 2023-05-26 LAB — POCT ACTIVATED CLOTTING TIME
Activated Clotting Time: 279 s
Activated Clotting Time: 227 s

## 2023-05-26 SURGERY — ATRIAL FIBRILLATION ABLATION
Anesthesia: General

## 2023-05-26 MED ORDER — PHENYLEPHRINE HCL-NACL 20-0.9 MG/250ML-% IV SOLN
INTRAVENOUS | Status: DC | PRN
  Administered 2023-05-26: 25 ug/min via INTRAVENOUS

## 2023-05-26 MED ORDER — ONDANSETRON HCL 4 MG/2ML IJ SOLN: 4.0000 mg | Freq: Four times a day (QID) | INTRAMUSCULAR | Status: DC | PRN

## 2023-05-26 MED ORDER — ATROPINE SULFATE 1 MG/ML IV SOLN
INTRAVENOUS | Status: DC | PRN
  Administered 2023-05-26: 1 mg via INTRAVENOUS

## 2023-05-26 MED ORDER — FENTANYL CITRATE (PF) 100 MCG/2ML IJ SOLN
INTRAMUSCULAR | Status: AC
  Filled 2023-05-26: qty 2

## 2023-05-26 MED ORDER — SODIUM CHLORIDE 0.9% FLUSH: 3.0000 mL | Freq: Two times a day (BID) | INTRAVENOUS | Status: DC

## 2023-05-26 MED ORDER — ACETAMINOPHEN 325 MG PO TABS: 650.0000 mg | ORAL_TABLET | ORAL | Status: DC | PRN

## 2023-05-26 MED ORDER — HEPARIN (PORCINE) IN NACL 1000-0.9 UT/500ML-% IV SOLN
INTRAVENOUS | Status: DC | PRN
  Administered 2023-05-26 (×4): 500 mL

## 2023-05-26 MED ORDER — HEPARIN SODIUM (PORCINE) 1000 UNIT/ML IJ SOLN
INTRAMUSCULAR | Status: DC | PRN
  Administered 2023-05-26: 6000 [IU] via INTRAVENOUS
  Administered 2023-05-26: 15000 [IU] via INTRAVENOUS

## 2023-05-26 MED ORDER — PROTAMINE SULFATE 10 MG/ML IV SOLN
INTRAVENOUS | Status: DC | PRN
  Administered 2023-05-26: 40 mg via INTRAVENOUS

## 2023-05-26 MED ORDER — FENTANYL CITRATE (PF) 250 MCG/5ML IJ SOLN
INTRAMUSCULAR | Status: DC | PRN
  Administered 2023-05-26: 100 ug via INTRAVENOUS

## 2023-05-26 MED ORDER — SODIUM CHLORIDE 0.9 % IV SOLN
INTRAVENOUS | Status: DC
Start: 2023-05-26 — End: 2023-05-26

## 2023-05-26 MED ORDER — SODIUM CHLORIDE 0.9 % IV SOLN: 250.0000 mL | INTRAVENOUS | Status: DC | PRN

## 2023-05-26 MED ORDER — LIDOCAINE 2% (20 MG/ML) 5 ML SYRINGE
INTRAMUSCULAR | Status: DC | PRN
  Administered 2023-05-26: 100 mg via INTRAVENOUS

## 2023-05-26 MED ORDER — PROPOFOL 10 MG/ML IV BOLUS
INTRAVENOUS | Status: DC | PRN
  Administered 2023-05-26: 80 mg via INTRAVENOUS
  Administered 2023-05-26: 120 mg via INTRAVENOUS

## 2023-05-26 MED ORDER — ROCURONIUM BROMIDE 10 MG/ML (PF) SYRINGE
PREFILLED_SYRINGE | INTRAVENOUS | Status: DC | PRN
  Administered 2023-05-26: 60 mg via INTRAVENOUS

## 2023-05-26 MED ORDER — ONDANSETRON HCL 4 MG/2ML IJ SOLN
INTRAMUSCULAR | Status: DC | PRN
  Administered 2023-05-26: 4 mg via INTRAVENOUS

## 2023-05-26 MED ORDER — SUGAMMADEX SODIUM 200 MG/2ML IV SOLN
INTRAVENOUS | Status: DC | PRN
  Administered 2023-05-26: 400 mg via INTRAVENOUS

## 2023-05-26 MED ORDER — DEXAMETHASONE SODIUM PHOSPHATE 10 MG/ML IJ SOLN
INTRAMUSCULAR | Status: DC | PRN
  Administered 2023-05-26: 10 mg via INTRAVENOUS

## 2023-05-26 MED ORDER — LACTATED RINGERS IV SOLN
INTRAVENOUS | Status: DC | PRN
Start: 2023-05-26 — End: 2023-05-26

## 2023-05-26 MED ORDER — SODIUM CHLORIDE 0.9% FLUSH: 3.0000 mL | INTRAVENOUS | Status: DC | PRN

## 2023-05-26 SURGICAL SUPPLY — 21 items
BLANKET WARM UNDERBOD FULL ACC (MISCELLANEOUS) ×1 IMPLANT
CABLE PFA RX CATH CONN (CABLE) IMPLANT
CATH FARAWAVE ABLATION 31 (CATHETERS) IMPLANT
CATH OCTARAY 2.0 F 3-3-3-3-3 (CATHETERS) IMPLANT
CATH SOUNDSTAR ECO 8FR (CATHETERS) IMPLANT
CATH WEBSTER BI DIR CS D-F CRV (CATHETERS) IMPLANT
CLOSURE MYNX CONTROL 6F/7F (Vascular Products) IMPLANT
CLOSURE PERCLOSE PROSTYLE (VASCULAR PRODUCTS) IMPLANT
COVER SWIFTLINK CONNECTOR (BAG) ×1 IMPLANT
DILATOR VESSEL 38 20CM 16FR (INTRODUCER) IMPLANT
GUIDEWIRE INQWIRE 1.5J.035X260 (WIRE) IMPLANT
INQWIRE 1.5J .035X260CM (WIRE) ×1 IMPLANT
KIT VERSACROSS CNCT FARADRIVE (KITS) IMPLANT
MAT PREVALON FULL STRYKER (MISCELLANEOUS) IMPLANT
PACK EP LF (CUSTOM PROCEDURE TRAY) ×1 IMPLANT
PAD DEFIB RADIO PHYSIO CONN (PAD) ×1 IMPLANT
PATCH CARTO3 (PAD) IMPLANT
SHEATH FARADRIVE STEERABLE (SHEATH) IMPLANT
SHEATH PINNACLE 8F 10CM (SHEATH) IMPLANT
SHEATH PINNACLE 9F 10CM (SHEATH) IMPLANT
SHEATH PROBE COVER 6X72 (BAG) IMPLANT

## 2023-05-26 NOTE — Transfer of Care (Signed)
 Immediate Anesthesia Transfer of Care Note  Patient: Timothy [name]  Procedure(s) Performed: ATRIAL FIBRILLATION ABLATION  Patient Location: PACU  Anesthesia Type:General  Level of Consciousness: awake, alert , oriented, and patient cooperative  Airway & Oxygen Therapy: Patient Spontanous Breathing and Patient connected to face mask oxygen  Post-op Assessment: Report given to RN, Post -op Vital signs reviewed and stable, Patient moving all extremities, Patient moving all extremities X 4, and Patient able to stick tongue midline  Post vital signs: Reviewed and stable  Last Vitals:  Vitals Value Taken Time  BP    Temp    Pulse    Resp    SpO2      Last Pain:  Vitals:   05/26/23 0628  TempSrc: Oral  PainSc:          Complications:  Encounter Notable Events  Notable Event Outcome Phase Comment  Difficult to intubate - expected  Intraprocedure Filed from anesthesia note documentation.

## 2023-05-26 NOTE — Discharge Instructions (Signed)

## 2023-05-26 NOTE — Anesthesia Procedure Notes (Signed)
 Procedure Name: Intubation Date/Time: 05/26/2023 9:05 AM  Performed by: Venia Carbon, CRNAPre-anesthesia Checklist: Patient identified, Emergency Drugs available, Suction available, Patient being monitored and Timeout performed Patient Re-evaluated:Patient Re-evaluated prior to induction Oxygen Delivery Method: Circle system utilized Preoxygenation: Pre-oxygenation with 100% oxygen Induction Type: IV induction Ventilation: Two handed mask ventilation required Laryngoscope Size: Glidescope and 4 Grade View: Grade III Tube size: 7.0 mm Number of attempts: 1 Airway Equipment and Method: Video-laryngoscopy Placement Confirmation: ETT inserted through vocal cords under direct vision, positive ETCO2, CO2 detector and breath sounds checked- equal and bilateral Secured at: 22 cm Tube secured with: Tape Difficulty Due To: Difficulty was anticipated and Difficult Airway- due to large tongue Future Recommendations: Recommend- induction with short-acting agent, and alternative techniques readily available Comments: Two hand mask easy ventilation. Grade 3 view with Glidescope 4. Ett placement confirmed. Recommend Fiberoptic intubation in the future.

## 2023-05-26 NOTE — Anesthesia Postprocedure Evaluation (Signed)
 Anesthesia Post Note  Patient: Timothy [name]  Procedure(s) Performed: ATRIAL FIBRILLATION ABLATION     Patient location during evaluation: PACU Anesthesia Type: General Level of consciousness: awake and alert Pain management: pain level controlled Vital Signs Assessment: post-procedure vital signs reviewed and stable Respiratory status: spontaneous breathing, nonlabored ventilation, respiratory function stable and patient connected to nasal cannula oxygen Cardiovascular status: blood pressure returned to baseline and stable Postop Assessment: no apparent nausea or vomiting Anesthetic complications: yes   Encounter Notable Events  Notable Event Outcome Phase Comment  Difficult to intubate - expected  Intraprocedure Filed from anesthesia note documentation.    Last Vitals:  Vitals:   05/26/23 0628  BP: 130/86  Pulse: 90  Resp: 17  Temp: 36.7 C  SpO2: 98%    Last Pain:  Vitals:   05/26/23 0628  TempSrc: Oral  PainSc:                  [name]

## 2023-05-26 NOTE — Progress Notes (Signed)
 Patient ambulated to BR, both groins are unremarkable.

## 2023-05-26 NOTE — Interval H&P Note (Signed)
 History and Physical Interval Note:  05/26/2023 7:55 AM  Timothy [name]  has presented today for surgery, with the diagnosis of afib.  The various methods of treatment have been discussed with the patient and family. After consideration of risks, benefits and other options for treatment, the patient has consented to  Procedure(s): ATRIAL FIBRILLATION ABLATION (N/A) as a surgical intervention.  The patient's history has been reviewed, patient examined, no change in status, stable for surgery.  I have reviewed the patient's chart and labs.  Questions were answered to the patient's satisfaction.     [name] [employer]

## 2023-05-27 ENCOUNTER — Encounter (HOSPITAL_COMMUNITY): Payer: Self-pay | Admitting: Cardiology

## 2023-05-27 ENCOUNTER — Telehealth (HOSPITAL_COMMUNITY): Payer: Self-pay

## 2023-05-27 NOTE — Telephone Encounter (Signed)
 Spoke with patient to complete post procedure follow up call.  Patient reports no complications with groin sites.   Instructions reviewed with patient:  Remove large bandage at puncture site after 24 hours. It is normal to have bruising, tenderness and a pea or marble sized lump/knot at the groin site which can take up to three months to resolve.  Get help right away if you notice sudden swelling at the puncture site.  Check your puncture site every day for signs of infection: fever, redness, swelling, pus drainage, warmth, foul odor or excessive pain. If this occurs, please call the office at [phone], to speak with the nurse. Get help right away if your puncture site is bleeding and the bleeding does not stop after applying firm pressure to the area.  You may continue to have skipped beats/ atrial fibrillation during the first several months after your procedure.  It is very important not to miss any doses of your blood thinner Eliquis. Patient restarted taking this medication on yesterday, 05/26/23.   You will follow up with the Afib clinic on 06/23/23 and follow up with the APP or Dr.Will Camnitz on 08/30/23.   Patient verbalized understanding to all instructions provided.

## 2023-05-28 ENCOUNTER — Other Ambulatory Visit (HOSPITAL_BASED_OUTPATIENT_CLINIC_OR_DEPARTMENT_OTHER): Payer: Self-pay

## 2023-06-02 ENCOUNTER — Other Ambulatory Visit: Payer: Self-pay | Admitting: Cardiovascular Disease

## 2023-06-02 ENCOUNTER — Other Ambulatory Visit (HOSPITAL_BASED_OUTPATIENT_CLINIC_OR_DEPARTMENT_OTHER): Payer: Self-pay

## 2023-06-04 ENCOUNTER — Other Ambulatory Visit (HOSPITAL_BASED_OUTPATIENT_CLINIC_OR_DEPARTMENT_OTHER): Payer: Self-pay

## 2023-06-04 DIAGNOSIS — Z79899 Other long term (current) drug therapy: Secondary | ICD-10-CM | POA: Diagnosis not present

## 2023-06-04 DIAGNOSIS — Z0000 Encounter for general adult medical examination without abnormal findings: Secondary | ICD-10-CM | POA: Diagnosis not present

## 2023-06-04 DIAGNOSIS — D6869 Other thrombophilia: Secondary | ICD-10-CM | POA: Diagnosis not present

## 2023-06-04 DIAGNOSIS — N401 Enlarged prostate with lower urinary tract symptoms: Secondary | ICD-10-CM | POA: Diagnosis not present

## 2023-06-04 DIAGNOSIS — E8881 Metabolic syndrome: Secondary | ICD-10-CM | POA: Diagnosis not present

## 2023-06-04 DIAGNOSIS — I25119 Atherosclerotic heart disease of native coronary artery with unspecified angina pectoris: Secondary | ICD-10-CM | POA: Diagnosis not present

## 2023-06-04 DIAGNOSIS — E785 Hyperlipidemia, unspecified: Secondary | ICD-10-CM | POA: Diagnosis not present

## 2023-06-04 DIAGNOSIS — M8580 Other specified disorders of bone density and structure, unspecified site: Secondary | ICD-10-CM | POA: Diagnosis not present

## 2023-06-04 DIAGNOSIS — I480 Paroxysmal atrial fibrillation: Secondary | ICD-10-CM | POA: Diagnosis not present

## 2023-06-04 DIAGNOSIS — K2960 Other gastritis without bleeding: Secondary | ICD-10-CM | POA: Diagnosis not present

## 2023-06-04 DIAGNOSIS — N138 Other obstructive and reflux uropathy: Secondary | ICD-10-CM | POA: Diagnosis not present

## 2023-06-04 DIAGNOSIS — E559 Vitamin D deficiency, unspecified: Secondary | ICD-10-CM | POA: Diagnosis not present

## 2023-06-04 MED ORDER — FUROSEMIDE 20 MG PO TABS
20.0000 mg | ORAL_TABLET | Freq: Every day | ORAL | 3 refills | Status: AC
  Filled 2023-06-04: qty 90, 90d supply, fill #0
  Filled 2023-08-23: qty 90, 90d supply, fill #1
  Filled 2023-11-24: qty 90, 90d supply, fill #2
  Filled 2024-02-19: qty 90, 90d supply, fill #3

## 2023-06-04 NOTE — Telephone Encounter (Signed)
This is a A-Fib clinic pt 

## 2023-06-16 ENCOUNTER — Other Ambulatory Visit (HOSPITAL_BASED_OUTPATIENT_CLINIC_OR_DEPARTMENT_OTHER): Payer: Self-pay

## 2023-06-23 ENCOUNTER — Ambulatory Visit (HOSPITAL_COMMUNITY)
Admission: RE | Admit: 2023-06-23 | Discharge: 2023-06-23 | Disposition: A | Discharge status: Home / Self Care | Payer: Medicare Other | Source: Ambulatory Visit | Attending: Internal Medicine | Admitting: Internal Medicine

## 2023-06-23 VITALS — BP 140/70 | HR 44 | Ht 66.0 in | Wt 230.4 lb

## 2023-06-23 DIAGNOSIS — D6869 Other thrombophilia: Secondary | ICD-10-CM | POA: Insufficient documentation

## 2023-06-23 DIAGNOSIS — G4733 Obstructive sleep apnea (adult) (pediatric): Secondary | ICD-10-CM | POA: Insufficient documentation

## 2023-06-23 DIAGNOSIS — Z79899 Other long term (current) drug therapy: Secondary | ICD-10-CM | POA: Diagnosis not present

## 2023-06-23 DIAGNOSIS — I340 Nonrheumatic mitral (valve) insufficiency: Secondary | ICD-10-CM | POA: Diagnosis not present

## 2023-06-23 DIAGNOSIS — I493 Ventricular premature depolarization: Secondary | ICD-10-CM | POA: Diagnosis not present

## 2023-06-23 DIAGNOSIS — Z7901 Long term (current) use of anticoagulants: Secondary | ICD-10-CM | POA: Insufficient documentation

## 2023-06-23 DIAGNOSIS — I2510 Atherosclerotic heart disease of native coronary artery without angina pectoris: Secondary | ICD-10-CM | POA: Insufficient documentation

## 2023-06-23 DIAGNOSIS — I4819 Other persistent atrial fibrillation: Secondary | ICD-10-CM | POA: Insufficient documentation

## 2023-06-23 DIAGNOSIS — I119 Hypertensive heart disease without heart failure: Secondary | ICD-10-CM | POA: Diagnosis not present

## 2023-06-23 DIAGNOSIS — R001 Bradycardia, unspecified: Secondary | ICD-10-CM | POA: Diagnosis not present

## 2023-06-23 MED ORDER — METOPROLOL TARTRATE 100 MG PO TABS: 50.0000 mg | ORAL_TABLET | Freq: Two times a day (BID) | ORAL | Status: DC

## 2023-06-23 NOTE — Progress Notes (Signed)
 Primary Care Physician: Street, Stephanie Coup, MD Primary Cardiologist: Dr Tresa Endo Primary Electrophysiologist: Dr Elberta Fortis  Referring Physician: Dr Lucky Rathke is a 78 y.o. male with a history of CAD, HLD, HTN, OSA, atrial fibrillation who presents for follow up in the [facility]. He was previously on amiodarone but developed skin toxicity. He had ablation x2 at [facility] by Dr. Sampson Goon in 2009. He has obstructive sleep apnea. June 2023 was found to be in atrial fibrillation. He had a cardioversion 10/05/2021. At that time, he had 3 unsuccessful attempts at cardioversion. Patient is on Eliquis for a CHADS2VASC score of 3. Patient was scheduled for ablation with Dr Elberta Fortis but the pre ablation CT and TEE showed LA thrombus and the surgery was cancelled. His Xarelto was changed to Eliquis. TEE on 04/03/22 showed persistent but smaller thrombus.   Patient is now s/p afib ablation with Dr Elberta Fortis on 08/27/22.   On follow up, patient reports that he continues to have fatigue and SOB with exertion. He remains in afib. No bleeding issues on anticoagulation.   On follow up 01/19/23, patient is here for Tikosyn admission. No new medications since last OV. No benadryl use. No missed doses of anticoagulant.   On follow up 01/29/23, he is currently in Afib with RVR. S/p Tikosyn admission 11/5-8/24. S/p successful DCCV on 01/21/23 x 2 shocks with 360J. QT prolonged and discharged on Tikosyn 125 mcg BID. Discharged on K+ 10 meq daily. He went out of rhythm yesterday at 1515.   On follow up 02/19/23, he is currently in Afib. S/p successful DCCV on 02/08/23. He is on Tikosyn 125 mcg BID. He feels tired when in Afib and SOB when he exerts himself. No missed doses of Tikosyn or Eliquis.  Follow up 05/04/23. Patient returns for follow up for atrial fibrillation and dofetilide monitoring. He is scheduled for afib ablation with Dr Elberta Fortis on 05/26/23. His pre ablation CT  was questionable for LA thrombus. Patient denies any missed doses of anticoagulation. He feels well today despite being in afib.   On follow up 06/23/23, patient is currently in sinus vs junctional rhythm. S/p Afib ablation on 05/26/23 by Dr. Elberta Fortis. No episodes of Afib since ablation. He noted yesterday to be dizzy and felt nauseous with HR in low 40s. No chest pain or SOB. Leg sites healed without issue. No missed doses of Eliquis 5 mg BID.   Today, he denies symptoms of orthopnea, PND, lower extremity edema, presyncope, syncope, snoring, daytime somnolence, bleeding, or neurologic sequela. The patient is tolerating medications without difficulties and is otherwise without complaint today.    Atrial Fibrillation Risk Factors:  he does have symptoms or diagnosis of sleep apnea. he does not have a history of rheumatic fever.   Atrial Fibrillation Management history:  Previous antiarrhythmic drugs: amiodarone, tikosyn Previous cardioversions: multiple, most recently 05/12/23 Previous ablations: 2008, 2009, 08/27/22, 05/26/23 Anticoagulation history: Xarelto, Eliquis   Past Medical History:  Diagnosis Date   Atrial fibrillation (HCC)    CAD (coronary artery disease)    LAD stent in 1999   Hyperlipidemia    Hypertension    PAF (paroxysmal atrial fibrillation) (HCC)    cardioversion in 2013   SSS (sick sinus syndrome) (HCC)     ROS- All systems are reviewed and negative except as per the HPI above.  Physical Exam: Vitals:   06/23/23 1103  BP: (!) 140/70  Pulse: (!) 44  Weight: 104.5 kg  Height: 5\' 6"  (1.676 m)    GEN- The patient is well appearing, alert and oriented x 3 today.   Neck - no JVD or carotid bruit noted Lungs- Clear to ausculation bilaterally, normal work of breathing Heart- Regular bradycardic rate and rhythm, no murmurs, rubs or gallops, PMI not laterally displaced Extremities- no clubbing, cyanosis, or edema Skin - no rash or ecchymosis noted   Wt Readings  from Last 3 Encounters:  06/23/23 104.5 kg  05/26/23 99.8 kg  05/12/23 99.8 kg    EKG today demonstrates  Vent. rate 44 BPM PR interval * ms QRS duration 108 ms QT/QTcB 472/403 ms P-R-T axes * 95 -28 Junctional bradycardia with occasional Premature ventricular complexes Rightward axis Low voltage QRS Nonspecific T wave abnormality Abnormal ECG When compared with ECG of 26-May-2023 10:05, PREVIOUS ECG IS PRESENT   Echo 10/10/21 demonstrated   1. Left ventricular ejection fraction, by estimation, is 45-50% with beat  to beat variability. The left ventricle has mildly decreased function.  Left ventricular endocardial border not optimally defined to evaluate  regional wall motion. There is mild left ventricular hypertrophy. Left ventricular diastolic parameters are indeterminate.   2. Right ventricular systolic function is mildly reduced. The right  ventricular size is normal. There is normal pulmonary artery systolic  pressure. The estimated right ventricular systolic pressure is 19.0 mmHg.   3. Left atrial size was mildly dilated.   4. The mitral valve is normal in structure. Mild mitral valve  regurgitation. No evidence of mitral stenosis.   5. The aortic valve is grossly normal. There is mild thickening of the  aortic valve. Aortic valve regurgitation is trivial. No aortic stenosis is  present.   6. The inferior vena cava is normal in size with greater than 50%  respiratory variability, suggesting right atrial pressure of 3 mmHg.   Comparison(s): A prior study was performed on 07/17/20. Prior images  reviewed side by side. LV function has decreased compared to prior exam.    Epic records are reviewed at length today  CHA2DS2-VASc Score = 3  The patient's score is based upon: CHF History: 0 HTN History: 1 Diabetes History: 0 Stroke History: 0 Vascular Disease History: 1 Age Score: 1 Gender Score: 0       ASSESSMENT AND PLAN: Persistent Atrial Fibrillation (ICD10:   I48.19) The patient's CHA2DS2-VASc score is 3, indicating a 3.2% annual risk of stroke.   S/p afib ablation 08/27/22 S/p dofetilide loading 01/2023 but failed to maintain SR. S/p successful TEE/DCCV on 05/12/23. S/p Afib ablation on 05/26/23 by Dr. Elberta Fortis.  He is currently in sinus vs junctional bradycardia. Continue diltiazem 180 mg daily. Will lower Lopressor to 50 mg BID and advised to monitor HR at home.   Secondary Hypercoagulable State (ICD10:  D68.69) The patient is at significant risk for stroke/thromboembolism based upon his CHA2DS2-VASc Score of 3.  Continue Apixaban (Eliquis).  No missed doses.    Follow up with EP as scheduled.  Justin Mend, PA-C Afib Clinic [facility] [address] [place], [place] [address] [phone] 06/23/2023 11:30 AM

## 2023-06-23 NOTE — Patient Instructions (Addendum)
Decrease metoprolol to 50mg twice a day °

## 2023-07-12 ENCOUNTER — Other Ambulatory Visit (HOSPITAL_BASED_OUTPATIENT_CLINIC_OR_DEPARTMENT_OTHER): Payer: Self-pay

## 2023-08-11 ENCOUNTER — Other Ambulatory Visit (HOSPITAL_BASED_OUTPATIENT_CLINIC_OR_DEPARTMENT_OTHER): Payer: Self-pay

## 2023-08-23 ENCOUNTER — Other Ambulatory Visit (HOSPITAL_BASED_OUTPATIENT_CLINIC_OR_DEPARTMENT_OTHER): Payer: Self-pay

## 2023-08-28 NOTE — Progress Notes (Unsigned)
  Electrophysiology Office Note:   Date:  08/28/2023  ID:  Timothy [name], DOB 1945/08/08, MRN [id number]  Primary Cardiologist: Magnus Schuller, MD Primary Heart Failure: None Electrophysiologist: Paolina Karwowski Timothy Ding, MD  {Click to update primary MD,subspecialty MD or APP then REFRESH:1}    History of Present Illness:   Timothy [name] is a 77 y.o. male with h/o coronary artery disease, hypertension, hyperlipidemia, sleep apnea, atrial fibrillation seen today for routine electrophysiology followup.   Since last being seen in our clinic the patient reports doing ***.  he denies chest pain, palpitations, dyspnea, PND, orthopnea, nausea, vomiting, dizziness, syncope, edema, weight gain, or early satiety.   Review of systems complete and found to be negative unless listed in HPI.   EP Information / Studies Reviewed:    {EKGtoday:28818}   Risk Assessment/Calculations:    CHA2DS2-VASc Score = 3  {Confirm score is correct.  If not, click here to update score.  REFRESH note.  :1} This indicates a 3.2% annual risk of stroke. The patient's score is based upon: CHF History: 0 HTN History: 1 Diabetes History: 0 Stroke History: 0 Vascular Disease History: 1 Age Score: 1 Gender Score: 0   {This patient has a significant risk of stroke if diagnosed with atrial fibrillation.  Please consider VKA or DOAC agent for anticoagulation if the bleeding risk is acceptable.   You can also use the SmartPhrase .[email or web address] for documentation.   :[id number]}       Physical Exam:   VS:  There were no vitals taken for this visit.   Wt Readings from Last 3 Encounters:  06/23/23 230 lb 6.4 oz (104.5 kg)  05/26/23 220 lb (99.8 kg)  05/12/23 220 lb (99.8 kg)     GEN: Well nourished, well developed in no acute distress NECK: No JVD; No carotid bruits CARDIAC: {EPRHYTHM:28826}, no murmurs, rubs, gallops RESPIRATORY:  Clear to auscultation without rales, wheezing or rhonchi  ABDOMEN: Soft, non-tender,  non-distended EXTREMITIES:  No edema; No deformity   ASSESSMENT AND PLAN:    1.  Persistent atrial fibrillation: On dofetilide .  Has had multiple ablations, most recently 05/04/2023.***  2.  Secondary hypercoagulable state: On Eliquis   3.  High risk medication monitoring: On dofetilide .  QTc remains stable.***  4.  Coronary artery disease: No current chest pain  5.  Obstructive sleep apnea: CPAP compliance encouraged  Follow up with {EPMDS:28135::EP Team} {EPFOLLOW YQ:65784}  Signed, Kavan Devan Timothy Ding, MD

## 2023-08-30 ENCOUNTER — Ambulatory Visit: Payer: Medicare Other | Attending: Cardiology | Admitting: Cardiology

## 2023-08-30 ENCOUNTER — Encounter: Payer: Self-pay | Admitting: Cardiology

## 2023-08-30 VITALS — BP 130/72 | HR 52 | Ht 66.0 in | Wt 230.8 lb

## 2023-08-30 DIAGNOSIS — I2510 Atherosclerotic heart disease of native coronary artery without angina pectoris: Secondary | ICD-10-CM | POA: Diagnosis not present

## 2023-08-30 DIAGNOSIS — G4733 Obstructive sleep apnea (adult) (pediatric): Secondary | ICD-10-CM | POA: Diagnosis not present

## 2023-08-30 DIAGNOSIS — D6869 Other thrombophilia: Secondary | ICD-10-CM | POA: Diagnosis not present

## 2023-08-30 DIAGNOSIS — I4819 Other persistent atrial fibrillation: Secondary | ICD-10-CM | POA: Diagnosis not present

## 2023-08-30 NOTE — Patient Instructions (Signed)
 Medication Instructions:  Your physician has recommended you make the following change in your medication:  STOP Lopressor   *If you need a refill on your cardiac medications before your next appointment, please call your pharmacy*  Lab Work: None ordered   Testing/Procedures: None ordered  Follow-Up: At [facility], you and your health needs are our priority.  As part of our continuing mission to provide you with exceptional heart care, our providers are all part of one team.  This team includes your primary Cardiologist (physician) and Advanced Practice Providers or APPs (Physician Assistants and Nurse Practitioners) who all work together to provide you with the care you need, when you need it.  Your next appointment:   1 year(s)  Provider:   Agatha Horsfall, MD     Thank you for choosing [facility]!!   Reece Cane, RN [phone]

## 2023-09-07 ENCOUNTER — Telehealth: Payer: Self-pay

## 2023-09-07 NOTE — Telephone Encounter (Signed)
**Note De-Identified Chang Tiggs Obfuscation** Per the Medicare Part A and B website: Medicare Part A and B do require prior authorization for certain services, including CPT code 04188

## 2023-09-13 ENCOUNTER — Other Ambulatory Visit (HOSPITAL_BASED_OUTPATIENT_CLINIC_OR_DEPARTMENT_OTHER): Payer: Self-pay

## 2023-10-12 ENCOUNTER — Other Ambulatory Visit (HOSPITAL_BASED_OUTPATIENT_CLINIC_OR_DEPARTMENT_OTHER): Payer: Self-pay

## 2023-11-01 ENCOUNTER — Other Ambulatory Visit (HOSPITAL_BASED_OUTPATIENT_CLINIC_OR_DEPARTMENT_OTHER): Payer: Self-pay

## 2023-11-01 ENCOUNTER — Other Ambulatory Visit: Payer: Self-pay | Admitting: Cardiovascular Disease

## 2023-11-01 ENCOUNTER — Encounter (HOSPITAL_BASED_OUTPATIENT_CLINIC_OR_DEPARTMENT_OTHER): Payer: Self-pay | Admitting: Pharmacy Technician

## 2023-11-01 MED ORDER — ATORVASTATIN CALCIUM 40 MG PO TABS
40.0000 mg | ORAL_TABLET | Freq: Every day | ORAL | 2 refills | Status: DC
  Filled 2023-11-01: qty 90, 90d supply, fill #0

## 2023-11-02 ENCOUNTER — Other Ambulatory Visit: Payer: Self-pay | Admitting: Physician Assistant

## 2023-11-04 ENCOUNTER — Other Ambulatory Visit (HOSPITAL_BASED_OUTPATIENT_CLINIC_OR_DEPARTMENT_OTHER): Payer: Self-pay

## 2023-11-05 ENCOUNTER — Other Ambulatory Visit (HOSPITAL_BASED_OUTPATIENT_CLINIC_OR_DEPARTMENT_OTHER): Payer: Self-pay

## 2023-11-05 MED ORDER — ATORVASTATIN CALCIUM 40 MG PO TABS
40.0000 mg | ORAL_TABLET | Freq: Every day | ORAL | 2 refills | Status: AC
  Filled 2023-11-05 – 2024-01-29 (×2): qty 90, 90d supply, fill #0

## 2023-11-08 ENCOUNTER — Other Ambulatory Visit: Payer: Self-pay | Admitting: Cardiology

## 2023-11-08 ENCOUNTER — Other Ambulatory Visit (HOSPITAL_BASED_OUTPATIENT_CLINIC_OR_DEPARTMENT_OTHER): Payer: Self-pay

## 2023-11-08 MED FILL — Apixaban Tab 5 MG: ORAL | 90 days supply | Qty: 180 | Fill #0 | Status: CN

## 2023-11-08 MED FILL — Apixaban Tab 5 MG: ORAL | 30 days supply | Qty: 60 | Fill #0 | Status: AC

## 2023-11-08 NOTE — Telephone Encounter (Signed)
 Prescription refill request for Eliquis  received. Indication:afib Last office visit:6/25 Scr:0.90  2/25 Age: 78 Weight:104.7  kg  Prescription refilled

## 2023-11-24 ENCOUNTER — Telehealth: Payer: Self-pay | Admitting: Cardiology

## 2023-11-24 ENCOUNTER — Other Ambulatory Visit (HOSPITAL_BASED_OUTPATIENT_CLINIC_OR_DEPARTMENT_OTHER): Payer: Self-pay

## 2023-11-24 NOTE — Telephone Encounter (Signed)
 Spoke with the patient who states that he has been back in Afib since Saturday night. He reports that his heart rate has been up in the 90s. He states that he has had low energy, some shortness of breath and dizziness at times. He states that he currently feels alright with heart rate at 98. He is currently taking diltiazem  180 mg daily. He states that his metoprolol  was discontinued but he restarted it on his own - taking 50 mg twice daily. He does have diltiazem  30 mg to take as needed. He has not taken any so far. Advised patient to take as needed if heart rate gets above 100. Patient has been scheduled for an appointment with Daphne Barrack, NP on 9/12.

## 2023-11-24 NOTE — Telephone Encounter (Signed)
 Patient sent message via patient schedule:  Timothy [name]  P [facility] Scheduling (supporting Will Gladis Norton, MD)1 hour ago (9:23 AM)    Out of breath, with rapid heart rate, low energy.  Dr.Camnitz has already done 2 ablations.    You  Timothy L Raymond1 hour ago (8:52 AM)    Can you please answer the following questions:     How long have you had palpitations/irregular HR/ Afib?        Are you having the symptoms now?    Are you currently experiencing lightheadedness, SOB or CP?    Do you have a history of afib (atrial fibrillation) or irregular heart rhythm?    Have you checked your BP or HR? (document readings if available):    Are you experiencing any other symptoms?    Thank you     Timothy [name]  P [facility] Scheduling (supporting Will Gladis Norton, MD)21 hours ago (12:55 PM)    Appointment Request From: Timothy [name]   With Provider: Will Gladis Norton [facility] of [facility]]   Preferred Date Range: 11/24/2023 - 11/26/2023   Preferred Times: Any   Reason for visit: Office Visit   Health Maintenance Topic:    Comments: I'm back in AFib.

## 2023-11-25 NOTE — Progress Notes (Unsigned)
 Electrophysiology Office Note:   Date:  11/26/2023  ID:  Timothy [name], DOB 1945/07/25, MRN [id number]  Primary Cardiologist: Debby Sor, MD (Inactive) Primary Heart Failure: None Electrophysiologist: Will Gladis Norton, MD      History of Present Illness:   Timothy [name] is a 78 y.o. male with h/o AF, LA thrombus, HTN, HLD, CAD, OSA seen today for acute visit due to AF.    Patient reports he has gone back into AF. He feels well overall / not particularly symptomatic in AF.  He states he went years at one point with AF and it did not interfere with his work / daily activities. He notices an occasional episode of fatigue. He does think that he has been in normal rhythm some this past week as well.   He denies chest pain, palpitations, dyspnea, PND, orthopnea, nausea, vomiting, dizziness, syncope, edema, weight gain, or early satiety.   Review of systems complete and found to be negative unless listed in HPI.   EP Information / Studies Reviewed:    EKG is ordered today. Personal review as below.  EKG Interpretation Date/Time:  Friday November 26 2023 09:14:24 EDT Ventricular Rate:  91 PR Interval:    QRS Duration:  108 QT Interval:  380 QTC Calculation: 467 R Axis:   87  Text Interpretation: Atrial fibrillation  with PVC's Confirmed by Aniceto Jarvis ([id number]) on 11/26/2023 9:39:16 AM    Arrhythmia / AAD / Pertinent EP Studies AF AF Ablation x2 at [facility] / Dr. Epifanio 01/2007, 04/2007  DCCV 10/05/21 > 3 unsuccessful attempts  EPS 08/27/22 > AF on presentation, RF current ablation of PVI + PW  Amiodarone > intolerant due to skin changes  Tikosyn  > admit for loading 01/2023, DCCV x1 with NSR.  Recurrent AF 01/29/23 > started on TID PRN cardizem  DCCV 02/08/23 > on Tikosyn , NSR  Recurrent AF 02/19/23, Tikosyn  stopped  EPS 05/26/23 > SR on presentation, PVI ablation with PF energy, ablation of posterior wall    Risk Assessment/Calculations:    CHA2DS2-VASc Score = 4   This  indicates a 4.8% annual risk of stroke. The patient's score is based upon: CHF History: 0 HTN History: 1 Diabetes History: 0 Stroke History: 0 Vascular Disease History: 1 Age Score: 2 Gender Score: 0          Physical Exam:   VS:  BP (!) 142/86   Pulse 91   Ht 5' 6 (1.676 m)   Wt 228 lb (103.4 kg)   SpO2 98%   BMI 36.80 kg/m    Wt Readings from Last 3 Encounters:  11/26/23 228 lb (103.4 kg)  08/30/23 230 lb 12.8 oz (104.7 kg)  06/23/23 230 lb 6.4 oz (104.5 kg)     GEN: pleasant, well nourished, well developed in no acute distress NECK: No JVD; No carotid bruits CARDIAC: Irregularly irregular rate and rhythm, no murmurs, rubs, gallops RESPIRATORY:  Clear to auscultation without rales, wheezing or rhonchi  ABDOMEN: Soft, non-tender, non-distended EXTREMITIES:  No edema; No deformity   ASSESSMENT AND PLAN:    Persistent Atrial Fibrillation  CHA2DS2-VASc 4, s/p multiple DCCV & ablation (RF & PF energy) -OAC for stroke prophylaxis  -reviewed with Dr. Norton, plan for rate control for now.  Might consider re-do RF ablation  -increase cardizem  to CD 240 mg daily   -EKG shows recurrent AF -V rates controlled > will not pursue DCCV at this time as he had one recently on Tikosyn  (post RF ablation) and had  ERAF -change metoprolol  to Toprol  XL 100 mg daily (same dose, has hx of bradycardia in past)  Secondary Hypercoagulable State  Hx LA Thrombus  -continue Eliquis  5mg  BID, dose reviewed and appropriate by age / wt   OSA  -CPAP compliance encouraged   Obesity  -slow, gradual weight reduction encouraged  CAD  -no anginal symptoms    Follow up with Dr. Inocencio 1-2 months to review if he would be candidate for any further RF ablation    Signed, Daphne Barrack, NP-C, AGACNP-BC [facility] - Electrophysiology  11/26/2023, 10:04 AM

## 2023-11-26 ENCOUNTER — Encounter: Payer: Self-pay | Admitting: Pulmonary Disease

## 2023-11-26 ENCOUNTER — Ambulatory Visit: Attending: Pulmonary Disease | Admitting: Pulmonary Disease

## 2023-11-26 ENCOUNTER — Other Ambulatory Visit (HOSPITAL_BASED_OUTPATIENT_CLINIC_OR_DEPARTMENT_OTHER): Payer: Self-pay

## 2023-11-26 VITALS — BP 142/86 | HR 91 | Ht 66.0 in | Wt 228.0 lb

## 2023-11-26 DIAGNOSIS — I2510 Atherosclerotic heart disease of native coronary artery without angina pectoris: Secondary | ICD-10-CM | POA: Diagnosis not present

## 2023-11-26 DIAGNOSIS — G4733 Obstructive sleep apnea (adult) (pediatric): Secondary | ICD-10-CM | POA: Diagnosis not present

## 2023-11-26 DIAGNOSIS — D6869 Other thrombophilia: Secondary | ICD-10-CM | POA: Insufficient documentation

## 2023-11-26 DIAGNOSIS — I4819 Other persistent atrial fibrillation: Secondary | ICD-10-CM | POA: Diagnosis not present

## 2023-11-26 MED ORDER — METOPROLOL SUCCINATE ER 100 MG PO TB24
100.0000 mg | ORAL_TABLET | Freq: Every day | ORAL | 3 refills | Status: AC
  Filled 2023-11-26: qty 90, 90d supply, fill #0
  Filled 2024-02-19: qty 90, 90d supply, fill #1

## 2023-11-26 MED ORDER — DILTIAZEM HCL ER COATED BEADS 240 MG PO CP24
240.0000 mg | ORAL_CAPSULE | Freq: Every day | ORAL | 3 refills | Status: AC
  Filled 2023-11-26 (×2): qty 90, 90d supply, fill #0
  Filled 2024-02-19: qty 90, 90d supply, fill #1

## 2023-11-26 NOTE — Patient Instructions (Signed)
 Medication Instructions:  Your physician has recommended you make the following change in your medication:  STOP DILTIAZEM  180 MG  START CARDIZEM  240 MG DAILY  STOP METOPROLOL  50 TWICE DAILY. START TOPROL  XL 100 MG DAILY IN THE EVENING    *If you need a refill on your cardiac medications before your next appointment, please call your pharmacy*  Lab Work: NONE If you have labs (blood work) drawn today and your tests are completely normal, you will receive your results only by: MyChart Message (if you have MyChart) OR A paper copy in the mail If you have any lab test that is abnormal or we need to change your treatment, we will call you to review the results.  Testing/Procedures: NONE  Follow-Up: At [facility], you and your health needs are our priority.  As part of our continuing mission to provide you with exceptional heart care, our providers are all part of one team.  This team includes your primary Cardiologist (physician) and Advanced Practice Providers or APPs (Physician Assistants and Nurse Practitioners) who all work together to provide you with the care you need, when you need it.  Your next appointment:   1-2 month(s)  Provider:   Soyla Norton, MD

## 2023-12-09 ENCOUNTER — Other Ambulatory Visit (HOSPITAL_BASED_OUTPATIENT_CLINIC_OR_DEPARTMENT_OTHER): Payer: Self-pay

## 2023-12-09 MED FILL — Apixaban Tab 5 MG: ORAL | 30 days supply | Qty: 60 | Fill #1 | Status: AC

## 2023-12-15 ENCOUNTER — Other Ambulatory Visit (HOSPITAL_BASED_OUTPATIENT_CLINIC_OR_DEPARTMENT_OTHER): Payer: Self-pay

## 2023-12-30 NOTE — Progress Notes (Signed)
 Electrophysiology Office Note:   Date:  12/31/2023  ID:  Timothy [name], DOB [birth year], MRN [id number]  Primary Cardiologist: Debby Sor, MD (Inactive) Primary Heart Failure: None Electrophysiologist: Trissa Molina Gladis Norton, MD      History of Present Illness:   Timothy [name] is a 78 y.o. male with h/o atrial fibrillation, LAA thrombus, hypertension, hyperlipidemia, coronary disease, sleep apnea seen today for routine electrophysiology followup.   Since last being seen in our clinic the patient reports doing overall well.  Unfortunately he is in atrial fibrillation.  He went into atrial fibrillation early in September.  He has some fatigue and shortness of breath but for the most part is able to do his daily activities, though he has to do them much more slowly.  He is interested in rhythm control.  he denies chest pain, palpitations, PND, orthopnea, nausea, vomiting, dizziness, syncope, edema, weight gain, or early satiety.   Review of systems complete and found to be negative unless listed in HPI.   EP Information / Studies Reviewed:    EKG is ordered today. Personal review as below.  EKG Interpretation Date/Time:  Friday December 31 2023 08:25:54 EDT Ventricular Rate:  94 PR Interval:    QRS Duration:  108 QT Interval:  392 QTC Calculation: 490 R Axis:   80  Text Interpretation: Atrial fibrillation with premature ventricular or aberrantly conducted complexes T wave abnormality, consider inferior ischemia Prolonged QT When compared with ECG of 26-Nov-2023 09:14, T wave inversion now evident in Inferior leads Nonspecific T wave abnormality now evident in Lateral leads Confirmed by Kabrea Seeney ([id number]) on 12/31/2023 8:40:19 AM     Risk Assessment/Calculations:    CHA2DS2-VASc Score = 4   This indicates a 4.8% annual risk of stroke. The patient's score is based upon: CHF History: 0 HTN History: 1 Diabetes History: 0 Stroke History: 0 Vascular Disease History: 1 Age  Score: 2 Gender Score: 0            Physical Exam:   VS:  BP 124/88 (BP Location: Right Arm, Patient Position: Sitting, Cuff Size: Large)   Pulse 94   Ht 5' 6 (1.676 m)   Wt 229 lb (103.9 kg)   SpO2 98%   BMI 36.96 kg/m    Wt Readings from Last 3 Encounters:  12/31/23 229 lb (103.9 kg)  11/26/23 228 lb (103.4 kg)  08/30/23 230 lb 12.8 oz (104.7 kg)     GEN: Well nourished, well developed in no acute distress NECK: No JVD; No carotid bruits CARDIAC: Irregularly irregular rate and rhythm, no murmurs, rubs, gallops RESPIRATORY:  Clear to auscultation without rales, wheezing or rhonchi  ABDOMEN: Soft, non-tender, non-distended EXTREMITIES:  No edema; No deformity   ASSESSMENT AND PLAN:    1.  Persistent atrial fibrillation: Post ablation 08/27/2022 with repeat ablation 05/26/2023.  He has unfortunately continued to have episodes of atrial fibrillation.  He feels tired, fatigued, short of breath that he attributes to his atrial fibrillation.  He would prefer a rhythm control strategy.  He has had ablations in the past and has been on both amiodarone and dofetilide .  There are no other good medication options for him.   he has had side effects with amiodarone and did not stay in rhythm with dofetilide  prior to his most recent ablation.  Due to that, we Teal Bontrager proceed with ablation.  Risk, benefits, and alternatives to EP study and radiofrequency/pulse field ablation for afib were also discussed in detail today. These risks  include but are not limited to stroke, bleeding, vascular damage, tamponade, perforation, damage to the esophagus, lungs, and other structures, pulmonary vein stenosis, worsening renal function, and death. The patient understands these risk and wishes to proceed.  We Morey Andonian therefore proceed with catheter ablation at the next available time.  Carto, ICE, anesthesia are requested for the procedure.  This patient Tymesha Ditmore require CT prior to ablation. To be scheduled.   2.   Secondary hypercoagulable state: On Eliquis   3.  Obstructive sleep apnea: CPAP compliance encouraged  4.  Obesity: Lifestyle modification encouraged  5.  Coronary artery disease: No current angina  Follow up with Afib Clinic as usual post procedure  Signed, Dontreal Miera Gladis Norton, MD

## 2023-12-31 ENCOUNTER — Ambulatory Visit: Attending: Cardiology | Admitting: Cardiology

## 2023-12-31 ENCOUNTER — Encounter: Payer: Self-pay | Admitting: Cardiology

## 2023-12-31 VITALS — BP 124/88 | HR 94 | Ht 66.0 in | Wt 229.0 lb

## 2023-12-31 DIAGNOSIS — D6869 Other thrombophilia: Secondary | ICD-10-CM | POA: Diagnosis not present

## 2023-12-31 DIAGNOSIS — G4733 Obstructive sleep apnea (adult) (pediatric): Secondary | ICD-10-CM | POA: Insufficient documentation

## 2023-12-31 DIAGNOSIS — I2510 Atherosclerotic heart disease of native coronary artery without angina pectoris: Secondary | ICD-10-CM | POA: Insufficient documentation

## 2023-12-31 DIAGNOSIS — I4819 Other persistent atrial fibrillation: Secondary | ICD-10-CM | POA: Insufficient documentation

## 2023-12-31 DIAGNOSIS — I10 Essential (primary) hypertension: Secondary | ICD-10-CM | POA: Diagnosis not present

## 2023-12-31 NOTE — Patient Instructions (Addendum)
 Medication Instructions:  Your physician recommends that you continue on your current medications as directed. Please refer to the Current Medication list given to you today.  *If you need a refill on your cardiac medications before your next appointment, please call your pharmacy*  Testing/Procedures: Ablation Your physician has recommended that you have an ablation. Catheter ablation is a medical procedure used to treat some cardiac arrhythmias (irregular heartbeats). During catheter ablation, a long, thin, flexible tube is put into a blood vessel in your groin (upper thigh), or neck. This tube is called an ablation catheter. It is then guided to your heart through the blood vessel. Radio frequency waves destroy small areas of heart tissue where abnormal heartbeats may cause an arrhythmia to start.   You are scheduled for Atrial Fibrillation Ablation on Friday, December 12th with Dr. Dr. Inocencio. Please arrive at the Main Entrance A at [facility]: [address] [place], [place] [address] at 5:30am.  What To Expect:  Labs: you will need to have lab work drawn within 30 days of your procedure. Please go to any [facility] location to have these drawn - no appointment is needed. Cardiac CT Scan: this will be done about 3-4 weeks prior to your procedure. You will be contacted to schedule this test. You will receive procedure instructions either through MyChart or in the mail 4-6 week prior to your procedure.  After your procedure we recommend no driving for 4 days, no lifting over 5 lbs for 7 days, and no work or strenuous activity for 7 days.  Please contact our office at [phone] if you have any questions.    Follow-Up: We will contact you to schedule your post-procedure appointments.

## 2024-01-10 ENCOUNTER — Other Ambulatory Visit (HOSPITAL_BASED_OUTPATIENT_CLINIC_OR_DEPARTMENT_OTHER): Payer: Self-pay

## 2024-01-10 MED FILL — Apixaban Tab 5 MG: ORAL | 30 days supply | Qty: 60 | Fill #2 | Status: AC

## 2024-01-17 ENCOUNTER — Other Ambulatory Visit: Payer: Self-pay | Admitting: Medical Genetics

## 2024-01-17 DIAGNOSIS — Z006 Encounter for examination for normal comparison and control in clinical research program: Secondary | ICD-10-CM

## 2024-01-18 ENCOUNTER — Other Ambulatory Visit: Payer: Self-pay | Admitting: Medical Genetics

## 2024-01-18 DIAGNOSIS — Z006 Encounter for examination for normal comparison and control in clinical research program: Secondary | ICD-10-CM

## 2024-01-18 NOTE — Addendum Note (Signed)
 Addended by: REMUS NOEMI PARAS on: 01/18/2024 12:09 PM   Modules accepted: Orders

## 2024-01-28 ENCOUNTER — Telehealth: Payer: Self-pay

## 2024-01-28 NOTE — Telephone Encounter (Signed)
-----   Message from Nurse Carlyle C sent at 12/31/2023  8:57 AM EDT ----- Regarding: 12/12 afib ablation Precert:  MD: Camnitz Type of ablation: A-fib Diagnosis: a-fib CPT code: A-fib (06343) Ablation scheduled (date/time): 12/12 at 7:30am   Procedure:  Added to calendar? Yes Orders entered? Yes Letter complete? No, >30 days before procedure Scheduled with cath lab? Yes Any medications to hold? No Labs ordered (CBC, BMET, PT/INR if on warfarin): Yes Mapping system: Doesn't matter CARTO/OPAL rep notified? No Cardiac CT needed? Yes, ordered Dye allergy? No Pre-meds ordered and instructions given? No, >30 days before procedure Letter method: MyChart H&P: 10/17 Device: No  Follow-up:  Cassie/Angel, please schedule Routine.  Covering RN - please send this message to [employer], EP scheduler, EP Scheduling pool, EP [employer], and CT scheduler (Brittany Lynch/Stephanie Mogg), if indicated.

## 2024-01-31 ENCOUNTER — Other Ambulatory Visit (HOSPITAL_BASED_OUTPATIENT_CLINIC_OR_DEPARTMENT_OTHER): Payer: Self-pay

## 2024-02-01 LAB — GENECONNECT MOLECULAR SCREEN: Genetic Analysis Overall Interpretation: NEGATIVE

## 2024-02-03 ENCOUNTER — Ambulatory Visit (HOSPITAL_COMMUNITY)
Admission: RE | Admit: 2024-02-03 | Discharge: 2024-02-03 | Disposition: A | Discharge status: Home / Self Care | Source: Ambulatory Visit | Attending: Student in an Organized Health Care Education/Training Program | Admitting: Student in an Organized Health Care Education/Training Program

## 2024-02-03 ENCOUNTER — Other Ambulatory Visit: Payer: Self-pay | Admitting: *Deleted

## 2024-02-03 DIAGNOSIS — I4819 Other persistent atrial fibrillation: Secondary | ICD-10-CM

## 2024-02-03 LAB — BASIC METABOLIC PANEL WITH GFR
BUN/Creatinine Ratio: 16 (ref 10–24)
BUN: 14 mg/dL (ref 8–27)
CO2: 19 mmol/L — ABNORMAL LOW (ref 20–29)
Calcium: 8.7 mg/dL (ref 8.6–10.2)
Chloride: 106 mmol/L (ref 96–106)
Creatinine, Ser: 0.89 mg/dL (ref 0.76–1.27)
Glucose: 114 mg/dL — ABNORMAL HIGH (ref 70–99)
Potassium: 4.2 mmol/L (ref 3.5–5.2)
Sodium: 138 mmol/L (ref 134–144)
eGFR: 89 mL/min/1.73 (ref >59)

## 2024-02-03 LAB — CBC
Hematocrit: 41.6 % (ref 37.5–51.0)
Hemoglobin: 14.1 g/dL (ref 13.0–17.7)
MCH: 32.3 pg (ref 26.6–33.0)
MCHC: 33.9 g/dL (ref 31.5–35.7)
MCV: 95 fL (ref 79–97)
Platelets: 213 x10E3/uL (ref 150–450)
RBC: 4.36 x10E6/uL (ref 4.14–5.80)
RDW: 12.9 % (ref 11.6–15.4)
WBC: 7.2 x10E3/uL (ref 3.4–10.8)

## 2024-02-03 MED ORDER — IOHEXOL 350 MG/ML SOLN
100.0000 mL | Freq: Once | INTRAVENOUS | Status: AC | PRN
  Administered 2024-02-03: 100 mL via INTRAVENOUS

## 2024-02-04 ENCOUNTER — Encounter (HOSPITAL_COMMUNITY): Payer: Self-pay

## 2024-02-04 ENCOUNTER — Telehealth (HOSPITAL_COMMUNITY): Payer: Self-pay

## 2024-02-04 ENCOUNTER — Other Ambulatory Visit (HOSPITAL_BASED_OUTPATIENT_CLINIC_OR_DEPARTMENT_OTHER): Payer: Self-pay

## 2024-02-04 ENCOUNTER — Other Ambulatory Visit (HOSPITAL_COMMUNITY): Payer: Self-pay

## 2024-02-04 ENCOUNTER — Ambulatory Visit (HOSPITAL_COMMUNITY)

## 2024-02-04 MED FILL — Apixaban Tab 5 MG: ORAL | 30 days supply | Qty: 60 | Fill #3 | Status: AC

## 2024-02-04 NOTE — Telephone Encounter (Signed)
 Spoke with patient to discuss upcoming procedure.   CT: completed.  Labs: completed.   Any recent signs of acute illness or been started on antibiotics? No Any new medications started?No Any medications to hold?  No Any missed doses of blood thinner?  No Advised patient to continue taking Eliquis  (Apixaban ) twice daily without missing any doses.  Medication instructions:  On the morning of your procedure DO NOT take any medication., including Eliquis  (Apixaban ) or the procedure may be rescheduled. Nothing to eat or drink after midnight prior to your procedure.  Confirmed patient is scheduled for Atrial Fibrillation Ablation on Friday, December 12 with Dr. Inocencio. Instructed patient to arrive at the Main Entrance A at [facility]: [address] [place], [place] [address] and check in at Admitting at 5:30 AM.   Plan to go home the same day, you will only stay overnight if medically necessary. You MUST have a responsible adult to drive you home and MUST be with you the first 24 hours after you arrive home or your procedure could be cancelled.  Informed patient a nurse will call a day before the procedure to confirm arrival time and ensure instructions are followed.  Patient verbalized understanding to all instructions provided and agreed to proceed with procedure.   Advised patient to contact RN Navigator at [phone], to inform of any new medications started after call or concerns prior to procedure.

## 2024-02-21 ENCOUNTER — Other Ambulatory Visit (HOSPITAL_BASED_OUTPATIENT_CLINIC_OR_DEPARTMENT_OTHER): Payer: Self-pay

## 2024-02-24 NOTE — Pre-Procedure Instructions (Signed)
 Instructed patient on the following items: Arrival time 0515 Nothing to eat or drink after midnight No meds AM of procedure Responsible person to drive you home and stay with you for 24 hrs  Have you missed any doses of anti-coagulant Eliquis- takes twice a day, hasn't missed any doses.  Don't take dose morning of procedure.

## 2024-02-25 ENCOUNTER — Other Ambulatory Visit: Payer: Self-pay

## 2024-02-25 ENCOUNTER — Encounter (HOSPITAL_COMMUNITY): Admission: RE | Disposition: A | Payer: Self-pay | Source: Home / Self Care | Attending: Cardiology

## 2024-02-25 ENCOUNTER — Ambulatory Visit (HOSPITAL_COMMUNITY): Admitting: Anesthesiology

## 2024-02-25 ENCOUNTER — Ambulatory Visit (HOSPITAL_COMMUNITY)
Admission: RE | Admit: 2024-02-25 | Discharge: 2024-02-25 | Disposition: A | Attending: Cardiology | Admitting: Cardiology

## 2024-02-25 DIAGNOSIS — I4819 Other persistent atrial fibrillation: Secondary | ICD-10-CM

## 2024-02-25 DIAGNOSIS — I10 Essential (primary) hypertension: Secondary | ICD-10-CM | POA: Diagnosis not present

## 2024-02-25 DIAGNOSIS — K219 Gastro-esophageal reflux disease without esophagitis: Secondary | ICD-10-CM | POA: Diagnosis not present

## 2024-02-25 DIAGNOSIS — Z955 Presence of coronary angioplasty implant and graft: Secondary | ICD-10-CM | POA: Insufficient documentation

## 2024-02-25 DIAGNOSIS — Z7901 Long term (current) use of anticoagulants: Secondary | ICD-10-CM | POA: Insufficient documentation

## 2024-02-25 DIAGNOSIS — I2510 Atherosclerotic heart disease of native coronary artery without angina pectoris: Secondary | ICD-10-CM | POA: Insufficient documentation

## 2024-02-25 DIAGNOSIS — G4730 Sleep apnea, unspecified: Secondary | ICD-10-CM | POA: Diagnosis not present

## 2024-02-25 DIAGNOSIS — I25119 Atherosclerotic heart disease of native coronary artery with unspecified angina pectoris: Secondary | ICD-10-CM | POA: Diagnosis not present

## 2024-02-25 DIAGNOSIS — Z87891 Personal history of nicotine dependence: Secondary | ICD-10-CM | POA: Insufficient documentation

## 2024-02-25 DIAGNOSIS — I4891 Unspecified atrial fibrillation: Secondary | ICD-10-CM | POA: Diagnosis not present

## 2024-02-25 HISTORY — PX: ATRIAL FIBRILLATION ABLATION: EP1191

## 2024-02-25 LAB — POCT ACTIVATED CLOTTING TIME
Activated Clotting Time: 276 s
Activated Clotting Time: 342 s

## 2024-02-25 SURGERY — ATRIAL FIBRILLATION ABLATION
Anesthesia: General

## 2024-02-25 MED ORDER — FENTANYL CITRATE (PF) 250 MCG/5ML IJ SOLN
INTRAMUSCULAR | Status: DC | PRN
  Administered 2024-02-25: 08:00:00 100 ug via INTRAVENOUS

## 2024-02-25 MED ORDER — ATROPINE SULFATE 1 MG/10ML IJ SOSY
PREFILLED_SYRINGE | INTRAMUSCULAR | Status: AC
  Filled 2024-02-25: qty 10

## 2024-02-25 MED ORDER — SODIUM CHLORIDE 0.9% FLUSH: 3.0000 mL | INTRAVENOUS | Status: DC | PRN

## 2024-02-25 MED ORDER — SODIUM CHLORIDE 0.9 % IV SOLN: INTRAVENOUS | Status: DC

## 2024-02-25 MED ORDER — SODIUM CHLORIDE 0.9 % IV SOLN: 250.0000 mL | INTRAVENOUS | Status: DC | PRN

## 2024-02-25 MED ORDER — PROPOFOL 10 MG/ML IV BOLUS
INTRAVENOUS | Status: DC | PRN
  Administered 2024-02-25: 70 mg via INTRAVENOUS
  Administered 2024-02-25: 130 mg via INTRAVENOUS

## 2024-02-25 MED ORDER — DEXAMETHASONE SOD PHOSPHATE PF 10 MG/ML IJ SOLN
INTRAMUSCULAR | Status: DC | PRN
  Administered 2024-02-25: 10 mg via INTRAVENOUS

## 2024-02-25 MED ORDER — FENTANYL CITRATE (PF) 100 MCG/2ML IJ SOLN
INTRAMUSCULAR | Status: AC
  Filled 2024-02-25: qty 2

## 2024-02-25 MED ORDER — MIDAZOLAM HCL 2 MG/2ML IJ SOLN
INTRAMUSCULAR | Status: AC
  Filled 2024-02-25: qty 2

## 2024-02-25 MED ORDER — ONDANSETRON HCL 4 MG/2ML IJ SOLN
INTRAMUSCULAR | Status: DC | PRN
  Administered 2024-02-25: 4 mg via INTRAVENOUS

## 2024-02-25 MED ORDER — SUCCINYLCHOLINE CHLORIDE 200 MG/10ML IV SOSY
PREFILLED_SYRINGE | INTRAVENOUS | Status: DC | PRN
  Administered 2024-02-25: 160 mg via INTRAVENOUS

## 2024-02-25 MED ORDER — PROTAMINE SULFATE 10 MG/ML IV SOLN
INTRAVENOUS | Status: DC | PRN
  Administered 2024-02-25: 40 mg via INTRAVENOUS

## 2024-02-25 MED ORDER — PROPOFOL 500 MG/50ML IV EMUL
INTRAVENOUS | Status: DC | PRN
  Administered 2024-02-25: 175 ug/kg/min via INTRAVENOUS

## 2024-02-25 MED ORDER — LIDOCAINE 2% (20 MG/ML) 5 ML SYRINGE
INTRAMUSCULAR | Status: DC | PRN
  Administered 2024-02-25: 80 mg via INTRAVENOUS

## 2024-02-25 MED ORDER — ONDANSETRON HCL 4 MG/2ML IJ SOLN: 4.0000 mg | Freq: Four times a day (QID) | INTRAMUSCULAR | Status: DC | PRN

## 2024-02-25 MED ORDER — PHENYLEPHRINE HCL-NACL 20-0.9 MG/250ML-% IV SOLN
INTRAVENOUS | Status: DC | PRN
  Administered 2024-02-25: 20 ug/min via INTRAVENOUS

## 2024-02-25 MED ORDER — HEPARIN SODIUM (PORCINE) 1000 UNIT/ML IJ SOLN
INTRAMUSCULAR | Status: DC | PRN
  Administered 2024-02-25: 1000 [IU] via INTRAVENOUS

## 2024-02-25 MED ORDER — HEPARIN SODIUM (PORCINE) 1000 UNIT/ML IJ SOLN
INTRAMUSCULAR | Status: AC
  Filled 2024-02-25: qty 10

## 2024-02-25 MED ORDER — PHENYLEPHRINE 80 MCG/ML (10ML) SYRINGE FOR IV PUSH (FOR BLOOD PRESSURE SUPPORT)
PREFILLED_SYRINGE | INTRAVENOUS | Status: DC | PRN
  Administered 2024-02-25: 80 ug via INTRAVENOUS

## 2024-02-25 MED ORDER — SODIUM CHLORIDE 0.9% FLUSH: 3.0000 mL | Freq: Two times a day (BID) | INTRAVENOUS | Status: DC

## 2024-02-25 MED ORDER — ROCURONIUM BROMIDE 10 MG/ML (PF) SYRINGE
PREFILLED_SYRINGE | INTRAVENOUS | Status: DC | PRN
  Administered 2024-02-25: 70 mg via INTRAVENOUS
  Administered 2024-02-25: 20 mg via INTRAVENOUS

## 2024-02-25 MED ORDER — ACETAMINOPHEN 325 MG PO TABS: 650.0000 mg | ORAL_TABLET | ORAL | Status: DC | PRN

## 2024-02-25 MED ORDER — HEPARIN SODIUM (PORCINE) 1000 UNIT/ML IJ SOLN
INTRAMUSCULAR | Status: DC | PRN
  Administered 2024-02-25: 2000 [IU] via INTRAVENOUS
  Administered 2024-02-25: 9000 [IU] via INTRAVENOUS
  Administered 2024-02-25: 15000 [IU] via INTRAVENOUS

## 2024-02-25 MED ORDER — HEPARIN (PORCINE) IN NACL 1000-0.9 UT/500ML-% IV SOLN
INTRAVENOUS | Status: DC | PRN
  Administered 2024-02-25 (×2): 500 mL

## 2024-02-25 MED ORDER — SUGAMMADEX SODIUM 200 MG/2ML IV SOLN
INTRAVENOUS | Status: DC | PRN
  Administered 2024-02-25: 200 mg via INTRAVENOUS

## 2024-02-25 SURGICAL SUPPLY — 17 items
BLANKET WARM UNDERBOD FULL ACC (MISCELLANEOUS) ×1 IMPLANT
CATH GE 8FR SOUNDSTAR (CATHETERS) IMPLANT
CATH OCTARAY 2.0 F 3-3-3-3-3 (CATHETERS) IMPLANT
CATH WEBSTER BI DIR CS D-F CRV (CATHETERS) IMPLANT
CATHETER VARIPULSE 8.5FR (CATHETERS) IMPLANT
CLOSURE MYNX CONTROL 6F/7F (Vascular Products) IMPLANT
CLOSURE PERCLOSE PROSTYLE (Vascular Products) IMPLANT
COVER SWIFTLINK CONNECTOR (BAG) ×1 IMPLANT
KIT VERSACROSS 8.5F 63 45D 180 (KITS) IMPLANT
PACK EP LF (CUSTOM PROCEDURE TRAY) ×1 IMPLANT
PAD DEFIB RADIO PHYSIO CONN (PAD) ×1 IMPLANT
PATCH CARTO3 (PAD) IMPLANT
SHEATH CARTO VIZIGO SM CVD (SHEATH) IMPLANT
SHEATH PINNACLE 8F 10CM (SHEATH) IMPLANT
SHEATH PINNACLE 9F 10CM (SHEATH) IMPLANT
SHEATH PROBE COVER 6X72 (BAG) IMPLANT
TUBING SMART ABLATE COOLFLOW (TUBING) IMPLANT

## 2024-02-25 NOTE — H&P (Signed)
°  Electrophysiology Office Note:   Date:  02/25/2024  ID:  RUSH SALCE, DOB 06-Jan-1946, MRN [id number]  Primary Cardiologist: Debby Sor, MD (Inactive) Primary Heart Failure: None Electrophysiologist: Pruitt Taboada Gladis Norton, MD      History of Present Illness:   Timothy [name] is a 78 y.o. male with h/o atrial fibrillation, LAA thrombus, hypertension, hyperlipidemia, coronary disease, sleep apnea seen today for routine electrophysiology followup.   Today, denies symptoms of palpitations, chest pain, dyspnea, orthopnea, PND, lower extremity edema, claudication, dizziness, presyncope, syncope, bleeding, or neurologic sequela. The patient is tolerating medications without difficulties. Plan ablation today.   EP Information / Studies Reviewed:    EKG is ordered today. Personal review as below.        Risk Assessment/Calculations:    CHA2DS2-VASc Score = 4   This indicates a 4.8% annual risk of stroke. The patient's score is based upon: CHF History: 0 HTN History: 1 Diabetes History: 0 Stroke History: 0 Vascular Disease History: 1 Age Score: 2 Gender Score: 0            Physical Exam:   VS:  BP 127/82   Pulse 75   Temp 98.3 F (36.8 C)   Resp 16   Ht 5' 6 (1.676 m)   Wt 102.1 kg   SpO2 100%   BMI 36.32 kg/m    Wt Readings from Last 3 Encounters:  02/25/24 102.1 kg  12/31/23 103.9 kg  11/26/23 103.4 kg    GEN: Well nourished, well developed in no acute distress NECK: No JVD; No carotid bruits CARDIAC: Regular rate and rhythm, no murmurs, rubs, gallops RESPIRATORY:  Clear to auscultation without rales, wheezing or rhonchi  ABDOMEN: Soft, non-tender, non-distended EXTREMITIES:  No edema; No deformity    ASSESSMENT AND PLAN:    1.  Persistent atrial fibrillation: HIKEEM ANDERSSON has presented today for surgery, with the diagnosis of AF.  The various methods of treatment have been discussed with the patient and family. After consideration of risks, benefits  and other options for treatment, the patient has consented to  Procedure(s): Catheter ablation as a surgical intervention .  Risks include but not limited to complete heart block, stroke, esophageal damage, nerve damage, bleeding, vascular damage, tamponade, perforation, MI, and death. The patient's history has been reviewed, patient examined, no change in status, stable for surgery.  I have reviewed the patient's chart and labs.  Questions were answered to the patient's satisfaction.    Declin Rajan Norton, MD 02/25/2024 7:05 AM

## 2024-02-25 NOTE — Anesthesia Preprocedure Evaluation (Signed)
 Anesthesia Evaluation  Patient identified by MRN, date of birth, ID band Patient awake    Reviewed: Allergy & Precautions, NPO status , Patient's Chart, lab work & pertinent test results, reviewed documented beta blocker date and time   History of Anesthesia Complications (+) DIFFICULT AIRWAY and history of anesthetic complications  Airway Mallampati: IV  TM Distance: >3 FB Neck ROM: Full   Comment: Ventilation: Mask ventilation with difficulty, Oral airway inserted - appropriate to patient size and Two handed mask ventilation required Laryngoscope Size: Glidescope and 4 Grade View: Grade II Tube type: Oral Tube size: 7.5 mm Number of attempts: 1 Airway Equipment and Method: Oral airway, Rigid stylet and Video-laryngoscopy Placement Confirmation: ETT inserted through vocal cords under direct vision, positive ETCO2 and breath sounds checked- equal and bilateral Secured at: 21 cm Tube secured with: Tape Dental Injury: Teeth and Oropharynx as per pre-operative assessment  Difficulty Due To: Difficulty was anticipated, Difficult Airway- due to large tongue, Difficult Airway- due to reduced neck mobility and Difficult Airway- due to limited oral opening    Dental  (+) Teeth Intact, Dental Advisory Given   Pulmonary sleep apnea and Continuous Positive Airway Pressure Ventilation , former smoker    + decreased breath sounds      Cardiovascular hypertension, Pt. on medications and Pt. on home beta blockers + angina  + CAD and + Cardiac Stents (LAD)  + dysrhythmias (SSS) Atrial Fibrillation  Rhythm:Irregular  1. Left ventricular ejection fraction, by estimation, is 50 to 55%. The  left ventricle has low normal function.   2. Right ventricular systolic function is mildly reduced. The right  ventricular size is normal.   3. Left atrial size was severely dilated. No left atrial/left atrial  appendage thrombus was detected. The LAA emptying  velocity was 18 cm/s.   4. The mitral valve is abnormal. Mild mitral valve regurgitation.   5. The aortic valve is tricuspid. Aortic valve regurgitation is trivial.  Aortic valve sclerosis is present, with no evidence of aortic valve  stenosis   6.     Neuro/Psych negative neurological ROS  negative psych ROS   GI/Hepatic Neg liver ROS,GERD  Medicated,,  Endo/Other  Obesity   Renal/GU negative Renal ROSLab Results      Component                Value               Date                      NA                       138                 02/03/2024                K                        4.2                 02/03/2024                CO2                      19 (L)              02/03/2024  GLUCOSE                  114 (H)             02/03/2024                BUN                      14                  02/03/2024                CREATININE               0.89                02/03/2024                CALCIUM                   8.7                 02/03/2024                EGFR                     89                  02/03/2024                GFRNONAA                 55 (L)              01/29/2023                Musculoskeletal negative musculoskeletal ROS (+)    Abdominal   Peds  Hematology  (+) Blood dyscrasia (Eliquis ) Lab Results      Component                Value               Date                      WBC                      7.2                 02/03/2024                HGB                      14.1                02/03/2024                HCT                      41.6                02/03/2024                MCV                      95                  02/03/2024  PLT                      213                 02/03/2024            eliquis    Anesthesia Other Findings Day of surgery medications reviewed with the patient.  Reproductive/Obstetrics                              Anesthesia Physical Anesthesia Plan  ASA:  3  Anesthesia Plan: General   Post-op Pain Management: Minimal or no pain anticipated   Induction: Intravenous  PONV Risk Score and Plan: 2 and Ondansetron , Dexamethasone  and Treatment may vary due to age or medical condition  Airway Management Planned: Oral ETT and Video Laryngoscope Planned  Additional Equipment: None  Intra-op Plan:   Post-operative Plan: Extubation in OR  Informed Consent: I have reviewed the patients History and Physical, chart, labs and discussed the procedure including the risks, benefits and alternatives for the proposed anesthesia with the patient or authorized representative who has indicated his/her understanding and acceptance.     Dental advisory given  Plan Discussed with: CRNA and Anesthesiologist  Anesthesia Plan Comments:         Anesthesia Quick Evaluation

## 2024-02-25 NOTE — Progress Notes (Signed)
 Discharge instructions reviewed with patient and spouse at bedside. Denies questions concerns. PT tolerated PO intake. Ambulated in the hallway, was able to void without difficulty. Seen by MD incision site remains clean dry and intact. No s/s of complications. PT escorted from the unit via wheel chair to personal vehicle.

## 2024-02-25 NOTE — Transfer of Care (Signed)
 Immediate Anesthesia Transfer of Care Note  Patient: Timothy [name]  Procedure(s) Performed: ATRIAL FIBRILLATION ABLATION  Patient Location: Cath Lab  Anesthesia Type:General  Level of Consciousness: awake, alert , oriented, and drowsy  Airway & Oxygen Therapy: Patient Spontanous Breathing and Patient connected to face mask oxygen  Post-op Assessment: Report given to RN and Post -op Vital signs reviewed and stable  Post vital signs: Reviewed and stable  Last Vitals:  Vitals Value Taken Time  BP    Temp    Pulse 50 02/25/24 09:00  Resp 12 02/25/24 09:00  SpO2 94 % 02/25/24 09:00  Vitals shown include unfiled device data.  Last Pain: There were no vitals filed for this visit.       Complications: There were no known notable events for this encounter.

## 2024-02-25 NOTE — Discharge Instructions (Addendum)

## 2024-02-25 NOTE — Progress Notes (Signed)
 Patient ambulated in hall. No bleeding at bilateral groin sites. Patient able to void with no complications, no S/S of being light headed or dizzy.

## 2024-02-25 NOTE — Anesthesia Procedure Notes (Addendum)
 Procedure Name: Intubation Date/Time: 02/25/2024 7:47 AM  Performed by: Mollie Olivia SAUNDERS, CRNAPre-anesthesia Checklist: Patient identified, Emergency Drugs available, Suction available and Patient being monitored Patient Re-evaluated:Patient Re-evaluated prior to induction Oxygen Delivery Method: Circle system utilized Preoxygenation: Pre-oxygenation with 100% oxygen Induction Type: IV induction and Cricoid Pressure applied Laryngoscope Size: Glidescope and 4 Grade View: Grade III Tube type: Oral Tube size: 7.0 mm Number of attempts: 1 Airway Equipment and Method: Rigid stylet and Video-laryngoscopy Placement Confirmation: ETT inserted through vocal cords under direct vision, positive ETCO2 and breath sounds checked- equal and bilateral Secured at: 23 cm Tube secured with: Tape Dental Injury: Teeth and Oropharynx as per pre-operative assessment  Difficulty Due To: Difficulty was anticipated, Difficult Airway- due to large tongue, Difficult Airway- due to reduced neck mobility, Difficult Airway- due to limited oral opening, Difficult Airway- due to anterior larynx and Difficult Airway- due to immobile epiglottis Future Recommendations: Recommend- induction with short-acting agent, and alternative techniques readily available Comments: Used 7.0 ETT d/t is what they used last intubation grade III c glidescope; it was still difficult to pass to back of pharnyx c glidescope in mouth, would recommend glidescope size3 next intbuation.

## 2024-02-25 NOTE — Anesthesia Postprocedure Evaluation (Signed)
 Anesthesia Post Note  Patient: Timothy [name]  Procedure(s) Performed: ATRIAL FIBRILLATION ABLATION     Patient location during evaluation: Cath Lab Anesthesia Type: General Level of consciousness: awake and alert Pain management: pain level controlled Vital Signs Assessment: post-procedure vital signs reviewed and stable Respiratory status: spontaneous breathing, nonlabored ventilation and respiratory function stable Cardiovascular status: blood pressure returned to baseline and stable Postop Assessment: no apparent nausea or vomiting Anesthetic complications: no   There were no known notable events for this encounter.  Last Vitals:  Vitals:   02/25/24 1030 02/25/24 1100  BP: 113/66 129/68  Pulse: 60 (!) 58  Resp: 18 17  Temp:    SpO2: 91% 93%    Last Pain:  Vitals:   02/25/24 1100  TempSrc:   PainSc: 0-No pain                 Adeeb Konecny

## 2024-02-26 ENCOUNTER — Encounter (HOSPITAL_COMMUNITY): Payer: Self-pay | Admitting: Cardiology

## 2024-02-28 ENCOUNTER — Telehealth (HOSPITAL_COMMUNITY): Payer: Self-pay

## 2024-02-28 NOTE — Telephone Encounter (Signed)
 Spoke with patient to complete post procedure follow up call.  Patient reports no complications with groin sites.   Instructions reviewed with patient:  It is normal to have bruising, tenderness, mild swelling, and a pea or marble sized lump/knot at the groin site which can take up to three months to resolve.  Get help right away if you notice sudden swelling at the puncture site.  Check your puncture site every day for signs of infection: fever, redness, swelling, pus drainage, warmth, foul odor or excessive pain. If this occurs, please call [phone], to speak with the RN Navigator. Get help right away if your puncture site is bleeding and the bleeding does not stop after applying firm pressure to the area.  You may continue to have skipped beats/ atrial fibrillation during the first several months after your procedure.  It is very important not to miss any doses of your blood thinner Eliquis .    You will follow up with the Afib clinic 4 weeks after your procedure and follow up with Dr. Inocencio 3 months after your procedure.  Activity restrictions reviewed.  Patient verbalized understanding to all instructions provided.

## 2024-02-29 ENCOUNTER — Encounter (HOSPITAL_BASED_OUTPATIENT_CLINIC_OR_DEPARTMENT_OTHER): Payer: Self-pay

## 2024-02-29 ENCOUNTER — Ambulatory Visit (HOSPITAL_BASED_OUTPATIENT_CLINIC_OR_DEPARTMENT_OTHER): Admission: EM | Admit: 2024-02-29 | Discharge: 2024-02-29 | Disposition: A | Discharge status: Home / Self Care

## 2024-02-29 DIAGNOSIS — R051 Acute cough: Secondary | ICD-10-CM | POA: Diagnosis not present

## 2024-02-29 NOTE — ED Triage Notes (Signed)
 Pt states he had a cardio ablation on Friday. That evening he started to have a cough, fever-100.9, wheezing, body aches- abdomen from coughing, facial pain, chills, and HA. Pt has taken tylenol  with some relief.

## 2024-02-29 NOTE — ED Provider Notes (Signed)
 [facility]    CSN: [id number] Arrival date & time: 02/29/24  0834      History   Chief Complaint Chief Complaint  Patient presents with   Cough    HPI Timothy [name] is a 78 y.o. male.   Pt states he had a cardio ablation on Friday. That evening he started to have a cough, fever-100.9, wheezing, body aches- abdomen from coughing, facial pain, chills, and HA. Pt has taken tylenol  with some relief.     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   Hypercoagulable state due to persistent atrial fibrillation (HCC) 03/23/2022   Persistent atrial fibrillation (HCC)    Mild obesity 11/10/2013   HTN (hypertension) 02/05/2013   PAF, long Hx of PAF, s/p prior RFA, past Amio intol (skin) 06/15/2011   Atrial fibrillation with RVR (HCC) 06/13/2011   Palpitations 06/13/2011   Chest pain 06/13/2011   CAD, LAD stent 1999, low risk Myoview June 2011 06/13/2011   Hyperlipidemia 06/13/2011    Past Surgical History:  Procedure Laterality Date   ATRIAL FIBRILLATION ABLATION N/A 02/20/2022   Procedure: ATRIAL FIBRILLATION ABLATION;  Surgeon: Inocencio Soyla Lunger, MD;  Location: [facility];  Service: Cardiovascular;  Laterality: N/A;   ATRIAL FIBRILLATION ABLATION N/A 08/27/2022   Procedure: ATRIAL FIBRILLATION ABLATION;  Surgeon: Inocencio Soyla Lunger, MD;  Location: [facility];  Service: Cardiovascular;  Laterality: N/A;   ATRIAL FIBRILLATION ABLATION N/A 05/26/2023   Procedure: ATRIAL FIBRILLATION ABLATION;  Surgeon: Inocencio Soyla Lunger, MD;  Location: [facility];  Service: Cardiovascular;   Laterality: N/A;   ATRIAL FIBRILLATION ABLATION N/A 02/25/2024   Procedure: ATRIAL FIBRILLATION ABLATION;  Surgeon: Inocencio Soyla Lunger, MD;  Location: [facility];  Service: Cardiovascular;  Laterality: N/A;   CARDIAC ELECTROPHYSIOLOGY STUDY AND ABLATION  2008, 2009   RF ablation (x4) [facility] - Dr. Epifanio)   CARDIOVERSION  06/15/2011   Procedure: CARDIOVERSION;  Surgeon: Vinie KYM Maxcy, MD;  Location: [facility] OR;  Service: Cardiovascular;  Laterality: N/A;   CARDIOVERSION N/A 09/15/2021   Procedure: CARDIOVERSION;  Surgeon: Jeffrie Oneil BROCKS, MD;  Location: [facility] ENDOSCOPY;  Service: Cardiovascular;  Laterality: N/A;   CARDIOVERSION N/A 01/21/2023   Procedure: CARDIOVERSION (CATH LAB);  Surgeon: Michele Richardson, DO;  Location: [facility];  Service: Cardiovascular;  Laterality: N/A;   CARDIOVERSION N/A 02/08/2023   Procedure: CARDIOVERSION (CATH LAB);  Surgeon: Kate Lonni LITTIE, MD;  Location: [facility] INVASIVE CV LAB;  Service: Cardiovascular;  Laterality: N/A;   CARDIOVERSION N/A 05/12/2023   Procedure: CARDIOVERSION;  Surgeon: Maxcy Vinie BROCKS, MD;  Location: [facility];  Service: Cardiovascular;  Laterality: N/A;   CORONARY ANGIOPLASTY WITH STENT PLACEMENT  10/29/1997   .0x20 self-expanding radius stent to LAD (Dr. FABIENE Pinion)   NM MYOCAR PERF WALL MOTION  07/2011   bruce myoview; partially fixed apical defect, worse at rest than stress; fixed inferobasal diaphragmatic attenuation artifact; no reversible ischemia; post-stress EF 60%; short run of NSVT; low risk scan    TEE  WITHOUT CARDIOVERSION N/A 04/03/2022   Procedure: TRANSESOPHAGEAL ECHOCARDIOGRAM (TEE);  Surgeon: Loni Soyla LABOR, MD;  Location: [facility] ENDOSCOPY;  Service: Cardiology;  Laterality: N/A;   TEE WITHOUT CARDIOVERSION N/A 02/20/2022   Procedure: TRANSESOPHAGEAL ECHOCARDIOGRAM (TEE);  Surgeon: Inocencio Soyla Lunger, MD;  Location: [facility] INVASIVE CV LAB;  Service: Cardiovascular;  Laterality: N/A;   TEE  WITHOUT CARDIOVERSION N/A 08/20/2022   Procedure: TRANSESOPHAGEAL ECHOCARDIOGRAM (TEE);  Surgeon: Jeffrie Oneil BROCKS, MD;  Location: [facility] INVASIVE CV LAB;  Service: Cardiovascular;  Laterality: N/A;   TRANSESOPHAGEAL ECHOCARDIOGRAM (CATH LAB) N/A 05/12/2023   Procedure: TRANSESOPHAGEAL ECHOCARDIOGRAM;  Surgeon: Mona Vinie BROCKS, MD;  Location: [facility];  Service: Cardiovascular;  Laterality: N/A;   TRANSTHORACIC ECHOCARDIOGRAM  06/13/2011   EF 55-60%, mod conc LVH; LA mildly dilated       Home Medications    Prior to Admission medications  Medication Sig Start Date End Date Taking? Authorizing Provider  apixaban  (ELIQUIS ) 5 MG TABS tablet Take 1 tablet (5 mg total) by mouth 2 (two) times daily. 11/08/23   Camnitz, Soyla Lunger, MD  aspirin  81 MG chewable tablet Chew 81 mg by mouth in the morning.    [provider]  atorvastatin  (LIPITOR) 40 MG tablet Take 1 tablet (40 mg total) by mouth at bedtime. 11/05/23   Camnitz, Soyla Lunger, MD  cholecalciferol (VITAMIN D3) 25 MCG (1000 UNIT) tablet Take 10,000 mcg by mouth once a week.    [provider]  diltiazem  (CARDIZEM  CD) 240 MG 24 hr capsule Take 1 capsule (240 mg total) by mouth daily. 11/26/23 05/24/24  Aniceto Daphne CROME, NP  diltiazem  (CARDIZEM ) 30 MG tablet TAKE 1 TABLET BY MOUTH EVERY 4 HOURS AS NEEDED FOR AFIB HEART RATE >100 AS LONG AS TOP BLOOD PRESSURE >100 04/30/23   Burnard Debby LABOR, MD  esomeprazole (NEXIUM) 20 MG capsule Take 20 mg by mouth daily before breakfast.    [provider]  furosemide  (LASIX ) 20 MG tablet Take 1 tablet (20 mg total) by mouth daily. 06/04/23 05/29/24  Terra Fairy PARAS, PA-C  GARLIC PO Take 1 capsule by mouth daily.    [provider]  loratadine (CLARITIN) 10 MG tablet Take 10 mg by mouth daily as needed for allergies.    [provider]  metoprolol  succinate (TOPROL -XL) 100 MG 24 hr tablet Take 1 tablet (100 mg total) by mouth daily. Take with or immediately following a  meal. 11/26/23 05/24/24  Aniceto Daphne CROME, NP  Omega-3 Fatty Acids (FISH OIL) 1000 MG CAPS Take 1,000 mg by mouth daily.    [provider]  potassium chloride  (KLOR-CON ) 10 MEQ tablet Take 1 tablet (10 mEq total) by mouth daily. 02/22/23       Family History Family History  Problem Relation Age of Onset   Coronary artery disease Father        MI   Hypertension Father    Diabetes Mother    Hypertension Sister    Atrial fibrillation Child    Hypertension Child     Social History Social History[1]   Allergies   Nitroglycerin    Review of Systems Review of Systems  Respiratory:  Positive for cough.      Physical Exam Triage Vital Signs ED Triage Vitals  Encounter Vitals Group     BP 02/29/24 0923 132/72     Girls Systolic BP Percentile --      Girls Diastolic BP Percentile --      Boys Systolic BP Percentile --  Boys Diastolic BP Percentile --      Pulse Rate 02/29/24 0923 63     Resp 02/29/24 0923 20     Temp 02/29/24 0923 98.1 F (36.7 C)     Temp Source 02/29/24 0923 Oral     SpO2 02/29/24 0923 95 %     Weight --      Height --      Head Circumference --      Peak Flow --      Pain Score 02/29/24 0922 3     Pain Loc --      Pain Education --      Exclude from Growth Chart --    No data found.  Updated Vital Signs BP 132/72 (BP Location: Right Arm)   Pulse 63   Temp 98.1 F (36.7 C) (Oral)   Resp 20   SpO2 95%   Visual Acuity Right Eye Distance:   Left Eye Distance:   Bilateral Distance:    Right Eye Near:   Left Eye Near:    Bilateral Near:     Physical Exam   UC Treatments / Results  Labs (all labs ordered are listed, but only abnormal results are displayed) Labs Reviewed - No data to display  EKG   Radiology No results found.  Procedures Procedures (including critical care time)  Medications Ordered in UC Medications - No data to display  Initial Impression / Assessment and Plan / UC Course  I have reviewed the  triage vital signs and the nursing notes.  Pertinent labs & imaging results that were available during my care of the patient were reviewed by me and considered in my medical decision making (see chart for details).     *** Final Clinical Impressions(s) / UC Diagnoses   Final diagnoses:  None   Discharge Instructions   None    ED Prescriptions   None    PDMP not reviewed this encounter.    [1]  Social History Tobacco Use   Smoking status: Former    Current packs/day: 0.00    Average packs/day: 1.0 packs/day    Types: Cigarettes    Quit date: 04/07/1975    Years since quitting: 48.9   Smokeless tobacco: Former   Tobacco comments:    Former smoker (quit smoking 1973. quit smokeless about 30 years ago.) 01/14/23  Vaping Use   Vaping status: Never Used  Substance Use Topics   Alcohol use: Yes   Drug use: No

## 2024-02-29 NOTE — Discharge Instructions (Signed)
 No concerns on exam. You can take Mucinex OTC for symptoms. Drink plenty of fluids.  Follow up as needed.

## 2024-03-01 MED FILL — Fentanyl Citrate Preservative Free (PF) Inj 100 MCG/2ML: INTRAMUSCULAR | Qty: 2 | Status: AC

## 2024-03-11 ENCOUNTER — Other Ambulatory Visit (HOSPITAL_BASED_OUTPATIENT_CLINIC_OR_DEPARTMENT_OTHER): Payer: Self-pay

## 2024-03-11 MED FILL — Apixaban Tab 5 MG: ORAL | 30 days supply | Qty: 60 | Fill #4 | Status: AC

## 2024-03-13 ENCOUNTER — Other Ambulatory Visit (HOSPITAL_BASED_OUTPATIENT_CLINIC_OR_DEPARTMENT_OTHER): Payer: Self-pay

## 2024-03-13 ENCOUNTER — Other Ambulatory Visit: Payer: Self-pay

## 2024-03-14 ENCOUNTER — Other Ambulatory Visit (HOSPITAL_BASED_OUTPATIENT_CLINIC_OR_DEPARTMENT_OTHER): Payer: Self-pay

## 2024-03-14 MED ORDER — POTASSIUM CHLORIDE ER 10 MEQ PO TBCR
10.0000 meq | EXTENDED_RELEASE_TABLET | Freq: Every day | ORAL | 3 refills | Status: AC
  Filled 2024-03-14: qty 90, 90d supply, fill #0

## 2024-03-24 ENCOUNTER — Ambulatory Visit (HOSPITAL_COMMUNITY)
Admission: RE | Admit: 2024-03-24 | Discharge: 2024-03-24 | Disposition: A | Source: Ambulatory Visit | Attending: Internal Medicine | Admitting: Internal Medicine

## 2024-03-24 ENCOUNTER — Encounter (HOSPITAL_COMMUNITY): Payer: Self-pay | Admitting: Internal Medicine

## 2024-03-24 VITALS — BP 128/74 | HR 58 | Ht 66.0 in | Wt 230.0 lb

## 2024-03-24 DIAGNOSIS — I4819 Other persistent atrial fibrillation: Secondary | ICD-10-CM | POA: Insufficient documentation

## 2024-03-24 DIAGNOSIS — D6869 Other thrombophilia: Secondary | ICD-10-CM | POA: Diagnosis not present

## 2024-03-24 NOTE — Progress Notes (Signed)
 "   Primary Care Physician: Street, Lonni HERO, MD Primary Cardiologist: Dr Burnard Primary Electrophysiologist: Dr Inocencio  Referring Physician: Dr Inocencio Timothy [name] [name] is a 79 y.o. male with a history of CAD, HLD, HTN, OSA, atrial fibrillation who presents for follow up in the [facility]. He was previously on amiodarone but developed skin toxicity. He had ablation x2 at [facility] by Dr. Epifanio in 2009. He has obstructive sleep apnea. June 2023 was found to be in atrial fibrillation. He had a cardioversion 10/05/2021. At that time, he had 3 unsuccessful attempts at cardioversion. Patient is on Eliquis  for a CHADS2VASC score of 3. Patient was scheduled for ablation with Dr Inocencio but the pre ablation CT and TEE showed LA thrombus and the surgery was cancelled. His Xarelto  was changed to Eliquis . TEE on 04/03/22 showed persistent but smaller thrombus.   Patient is now s/p afib ablation with Dr Inocencio on 08/27/22.   On follow up, patient reports that he continues to have fatigue and SOB with exertion. He remains in afib. No bleeding issues on anticoagulation.   On follow up 01/19/23, patient is here for Tikosyn  admission. No new medications since last OV. No benadryl use. No missed doses of anticoagulant.   On follow up 01/29/23, he is currently in Afib with RVR. S/p Tikosyn  admission 11/5-8/24. S/p successful DCCV on 01/21/23 x 2 shocks with 360J. QT prolonged and discharged on Tikosyn  125 mcg BID. Discharged on K+ 10 meq daily. He went out of rhythm yesterday at 1515.   On follow up 02/19/23, he is currently in Afib. S/p successful DCCV on 02/08/23. He is on Tikosyn  125 mcg BID. He feels tired when in Afib and SOB when he exerts himself. No missed doses of Tikosyn  or Eliquis .  Follow up 05/04/23. Patient returns for follow up for atrial fibrillation and dofetilide  monitoring. He is scheduled for afib ablation with Dr Inocencio on 05/26/23. His pre ablation CT  was questionable for LA thrombus. Patient denies any missed doses of anticoagulation. He feels well today despite being in afib.   On follow up 06/23/23, patient is currently in sinus vs junctional rhythm. S/p Afib ablation on 05/26/23 by Dr. Inocencio. No episodes of Afib since ablation. He noted yesterday to be dizzy and felt nauseous with HR in low 40s. No chest pain or SOB. Leg sites healed without issue. No missed doses of Eliquis  5 mg BID.   On follow up 03/24/2024, patient is currently in NSR. S/p Afib ablation on 02/25/2024 by Dr. Inocencio. No episodes of Afib since ablation. No chest pain or SOB. Leg sites healed without issue. No missed doses of anticoagulant.  Today, he denies symptoms of orthopnea, PND, lower extremity edema, dizziness, presyncope, syncope, snoring, daytime somnolence, bleeding, or neurologic sequela. The patient is tolerating medications without difficulties and is otherwise without complaint today.     Atrial Fibrillation Risk Factors:  he does have symptoms or diagnosis of sleep apnea. he does not have a history of rheumatic fever.   Atrial Fibrillation Management history:  Previous antiarrhythmic drugs: amiodarone, tikosyn  Previous cardioversions: multiple, most recently 05/12/23 Previous ablations: 2008, 2009, 08/27/22, 05/26/23, 02/25/2024 Anticoagulation history: Xarelto , Eliquis    Past Medical History:  Diagnosis Date   Atrial fibrillation (HCC)    CAD (coronary artery disease)    LAD stent in 1999   Hyperlipidemia    Hypertension    PAF (paroxysmal atrial fibrillation) (HCC)    cardioversion in 2013  SSS (sick sinus syndrome) (HCC)     ROS- All systems are reviewed and negative except as per the HPI above.  Physical Exam: Vitals:   03/24/24 1004  BP: 128/74  Pulse: (!) 58  Weight: 104.3 kg  Height: 5' 6 (1.676 m)    GEN- The patient is well appearing, alert and oriented x 3 today.   Neck - no JVD or carotid bruit noted Lungs- Clear to  ausculation bilaterally, normal work of breathing Heart- Regular rate and rhythm, no murmurs, rubs or gallops, PMI not laterally displaced Extremities- no clubbing, cyanosis, or edema Skin - no rash or ecchymosis noted    Wt Readings from Last 3 Encounters:  03/24/24 104.3 kg  02/25/24 102.1 kg  12/31/23 103.9 kg    EKG today demonstrates  EKG Interpretation Date/Time:  Friday March 24 2024 10:06:14 EST Ventricular Rate:  58 PR Interval:  166 QRS Duration:  108 QT Interval:  454 QTC Calculation: 445 R Axis:   53  Text Interpretation: Sinus bradycardia Nonspecific T wave abnormality Abnormal ECG When compared with ECG of 25-Feb-2024 09:14, Premature supraventricular complexes are no longer Present Confirmed by Terra Pac ([id number]) on 03/24/2024 10:20:46 AM     Echo 10/10/21 demonstrated   1. Left ventricular ejection fraction, by estimation, is 45-50% with beat  to beat variability. The left ventricle has mildly decreased function.  Left ventricular endocardial border not optimally defined to evaluate  regional wall motion. There is mild left ventricular hypertrophy. Left ventricular diastolic parameters are indeterminate.   2. Right ventricular systolic function is mildly reduced. The right  ventricular size is normal. There is normal pulmonary artery systolic  pressure. The estimated right ventricular systolic pressure is 19.0 mmHg.   3. Left atrial size was mildly dilated.   4. The mitral valve is normal in structure. Mild mitral valve  regurgitation. No evidence of mitral stenosis.   5. The aortic valve is grossly normal. There is mild thickening of the  aortic valve. Aortic valve regurgitation is trivial. No aortic stenosis is  present.   6. The inferior vena cava is normal in size with greater than 50%  respiratory variability, suggesting right atrial pressure of 3 mmHg.   Comparison(s): A prior study was performed on 07/17/20. Prior images  reviewed side by side. LV  function has decreased compared to prior exam.    Epic records are reviewed at length today  CHA2DS2-VASc Score = 4  The patient's score is based upon: CHF History: 0 HTN History: 1 Diabetes History: 0 Stroke History: 0 Vascular Disease History: 1 Age Score: 2 Gender Score: 0       ASSESSMENT AND PLAN: Persistent Atrial Fibrillation (ICD10:  I48.19) The patient's CHA2DS2-VASc score is 4, indicating a 4.8% annual risk of stroke.   S/p afib ablation 08/27/22 S/p dofetilide  loading 01/2023 but failed to maintain SR. S/p successful TEE/DCCV on 05/12/23. S/p Afib ablation on 05/26/23 by Dr. Inocencio. S/p A-fib ablation on 02/25/2024 by Dr. Inocencio.  Patient is currently in NSR.  We discussed what to expect during the recovery period following ablation.  Continue diltiazem  240 mg daily.  Continue Toprol  100 mg daily.   Secondary Hypercoagulable State (ICD10:  D68.69) The patient is at significant risk for stroke/thromboembolism based upon his CHA2DS2-VASc Score of 4.  Continue Apixaban  (Eliquis ).  Continue Eliquis  5 mg twice daily without interruption in the blanking period.   Follow up with EP as scheduled.   Dorn Terra, PA-C Afib Clinic [facility]  [facility] [address] [place], [place] [address] [phone] 03/24/2024 10:21 AM "

## 2024-04-06 MED FILL — Apixaban Tab 5 MG: ORAL | 30 days supply | Qty: 60 | Fill #5 | Status: AC

## 2024-04-07 ENCOUNTER — Other Ambulatory Visit (HOSPITAL_BASED_OUTPATIENT_CLINIC_OR_DEPARTMENT_OTHER): Payer: Self-pay

## 2024-05-29 ENCOUNTER — Ambulatory Visit: Admitting: Cardiology
